# Patient Record
Sex: Male | Born: 1965 | Race: White | Hispanic: No | Marital: Single | Smoking: Former smoker
Health system: Southern US, Community
[De-identification: ages and names within clinical notes are randomized; demographics above are authoritative.]

## PROBLEM LIST (undated history)

## (undated) DIAGNOSIS — L309 Dermatitis, unspecified: Secondary | ICD-10-CM

## (undated) DIAGNOSIS — K219 Gastro-esophageal reflux disease without esophagitis: Secondary | ICD-10-CM

## (undated) DIAGNOSIS — F419 Anxiety disorder, unspecified: Secondary | ICD-10-CM

## (undated) DIAGNOSIS — J019 Acute sinusitis, unspecified: Secondary | ICD-10-CM

## (undated) DIAGNOSIS — E119 Type 2 diabetes mellitus without complications: Secondary | ICD-10-CM

## (undated) DIAGNOSIS — C801 Malignant (primary) neoplasm, unspecified: Secondary | ICD-10-CM

## (undated) DIAGNOSIS — Z86010 Personal history of colonic polyps: Secondary | ICD-10-CM

## (undated) DIAGNOSIS — D494 Neoplasm of unspecified behavior of bladder: Secondary | ICD-10-CM

## (undated) DIAGNOSIS — G4733 Obstructive sleep apnea (adult) (pediatric): Secondary | ICD-10-CM

## (undated) DIAGNOSIS — R739 Hyperglycemia, unspecified: Secondary | ICD-10-CM

## (undated) DIAGNOSIS — I10 Essential (primary) hypertension: Secondary | ICD-10-CM

## (undated) DIAGNOSIS — I4510 Unspecified right bundle-branch block: Secondary | ICD-10-CM

## (undated) DIAGNOSIS — Z973 Presence of spectacles and contact lenses: Secondary | ICD-10-CM

## (undated) DIAGNOSIS — Z860101 Personal history of adenomatous and serrated colon polyps: Secondary | ICD-10-CM

## (undated) DIAGNOSIS — J302 Other seasonal allergic rhinitis: Secondary | ICD-10-CM

## (undated) DIAGNOSIS — R7303 Prediabetes: Secondary | ICD-10-CM

## (undated) DIAGNOSIS — R351 Nocturia: Secondary | ICD-10-CM

## (undated) DIAGNOSIS — Z87442 Personal history of urinary calculi: Secondary | ICD-10-CM

## (undated) HISTORY — DX: Dermatitis, unspecified: L30.9

## (undated) HISTORY — DX: Hyperglycemia, unspecified: R73.9

## (undated) HISTORY — DX: Malignant (primary) neoplasm, unspecified: C80.1

## (undated) HISTORY — PX: WISDOM TOOTH EXTRACTION: SHX21

## (undated) HISTORY — DX: Acute sinusitis, unspecified: J01.90

---

## 2003-02-24 HISTORY — PX: APPENDECTOMY: SHX54

## 2003-08-14 ENCOUNTER — Emergency Department (HOSPITAL_COMMUNITY): Admission: EM | Admit: 2003-08-14 | Discharge: 2003-08-15 | Payer: Self-pay | Admitting: Emergency Medicine

## 2003-08-16 ENCOUNTER — Encounter (INDEPENDENT_AMBULATORY_CARE_PROVIDER_SITE_OTHER): Payer: Self-pay | Admitting: Specialist

## 2003-08-16 ENCOUNTER — Inpatient Hospital Stay (HOSPITAL_COMMUNITY): Admission: EM | Admit: 2003-08-16 | Discharge: 2003-08-21 | Payer: Self-pay | Admitting: Emergency Medicine

## 2003-08-16 HISTORY — PX: LAPAROSCOPIC APPENDECTOMY: SUR753

## 2004-10-17 ENCOUNTER — Ambulatory Visit: Payer: Self-pay | Admitting: Internal Medicine

## 2004-11-17 ENCOUNTER — Ambulatory Visit: Payer: Self-pay | Admitting: Internal Medicine

## 2004-11-17 ENCOUNTER — Encounter (INDEPENDENT_AMBULATORY_CARE_PROVIDER_SITE_OTHER): Payer: Self-pay | Admitting: Nurse Practitioner

## 2004-11-17 LAB — CONVERTED CEMR LAB
Glucose, Bld: 91 mg/dL
HDL: 29 mg/dL
LDL Cholesterol: 76 mg/dL
Total CHOL/HDL Ratio: 4.7
VLDL: 32 mg/dL

## 2006-09-04 ENCOUNTER — Emergency Department (HOSPITAL_COMMUNITY): Admission: EM | Admit: 2006-09-04 | Discharge: 2006-09-04 | Payer: Self-pay | Admitting: Emergency Medicine

## 2006-09-29 ENCOUNTER — Ambulatory Visit: Payer: Self-pay | Admitting: Internal Medicine

## 2006-09-29 DIAGNOSIS — K029 Dental caries, unspecified: Secondary | ICD-10-CM | POA: Insufficient documentation

## 2006-10-18 ENCOUNTER — Ambulatory Visit: Payer: Self-pay | Admitting: Internal Medicine

## 2006-10-18 ENCOUNTER — Encounter: Payer: Self-pay | Admitting: Nurse Practitioner

## 2006-10-18 DIAGNOSIS — F411 Generalized anxiety disorder: Secondary | ICD-10-CM | POA: Insufficient documentation

## 2006-10-18 DIAGNOSIS — F329 Major depressive disorder, single episode, unspecified: Secondary | ICD-10-CM | POA: Insufficient documentation

## 2006-10-18 LAB — CONVERTED CEMR LAB
BUN: 9 mg/dL (ref 6–23)
Basophils Absolute: 0 10*3/uL (ref 0.0–0.1)
Basophils Relative: 1 % (ref 0–1)
Chloride: 106 meq/L (ref 96–112)
Eosinophils Absolute: 0.2 10*3/uL (ref 0.0–0.7)
Glucose, Bld: 79 mg/dL (ref 70–99)
Hemoglobin: 17.7 g/dL — ABNORMAL HIGH (ref 13.0–17.0)
Lymphocytes Relative: 37 % (ref 12–46)
Lymphs Abs: 2.6 10*3/uL (ref 0.7–3.3)
MCV: 89.5 fL (ref 78.0–100.0)
Monocytes Relative: 7 % (ref 3–11)
RBC: 5.51 M/uL (ref 4.22–5.81)
RDW: 12.5 % (ref 11.5–14.0)
TSH: 1.717 microintl units/mL (ref 0.350–5.50)
Total Protein: 6.9 g/dL (ref 6.0–8.3)
WBC: 7 10*3/uL (ref 4.0–10.5)

## 2006-10-26 ENCOUNTER — Ambulatory Visit: Payer: Self-pay | Admitting: *Deleted

## 2006-11-10 ENCOUNTER — Encounter (INDEPENDENT_AMBULATORY_CARE_PROVIDER_SITE_OTHER): Payer: Self-pay | Admitting: *Deleted

## 2008-03-08 ENCOUNTER — Ambulatory Visit: Payer: Self-pay | Admitting: Internal Medicine

## 2008-03-08 DIAGNOSIS — J019 Acute sinusitis, unspecified: Secondary | ICD-10-CM

## 2008-03-08 DIAGNOSIS — F17200 Nicotine dependence, unspecified, uncomplicated: Secondary | ICD-10-CM | POA: Insufficient documentation

## 2008-03-08 HISTORY — DX: Acute sinusitis, unspecified: J01.90

## 2010-07-11 NOTE — H&P (Signed)
Casey [name], VENEY NO.:  [id number]   MEDICAL RECORD NO.:  [id number]                   PATIENT TYPE:  INP   LOCATION:  [place]                                 FACILITY:  [facility]   PHYSICIAN:  Edsel Petrin. Dalbert Batman, M.D.             DATE OF BIRTH:  1965-05-28   DATE OF ADMISSION:  08/16/2003  DATE OF DISCHARGE:                                HISTORY & PHYSICAL   CHIEF COMPLAINT:  Abdominal pain.   HISTORY OF PRESENT ILLNESS:  This is a 45 year old white male, previously in  good health, who 48 hours ago, on Tuesday, June 21 after lunch, he developed  the gradual onset of diffuse abdominal pain, nonlocalized.  Later on that  day, about four hours later, he came to [facility] emergency room.  He  rechecked a workup and was told that his CAT scan was normal except for a  few nonobstructing kidney stones.  He was sent home.  Yesterday, August 15, 2002, he took some Vicodin and had a little bit of nausea.  In the evening,  he felt better, but then late last night, the pain got worse and is now more  localized in the right lower quadrant.  He denies diarrhea, nausea,  vomiting, fevers or chills.  He saw Dr. Arlyss Queen at urgent medical care  today.  Dr. Everlene Farrier felt that he was mostly tender in the right lower quadrant  and was concerned about appendicitis.  A white blood cell count was 18,000.  I was called, and the patient was transferred to [facility] emergency  department.  I believe the patient has appendicitis and is being admitted  for appendectomy.   PAST MEDICAL HISTORY:  He denies any prior medical problems other than  anxiety.  The only surgery he has had is oral surgery.   CURRENT MEDICATIONS:  1. Paxil, dose unknown, 1 tablet daily.  2. Xanax tablets p.r.n.   DRUG ALLERGIES:  He was told that he had a PENICILLIN allergy in childhood  but does not really know what the reaction was.   SOCIAL HISTORY:  The patient is single and lives in [place].   He has no  children.  He lives alone.  He works as a Animator.  He  smokes 1-1/2 packs of cigarettes a day.  Drinks alcohol rarely.   FAMILY HISTORY:  Mother living.  Has bronchitis and recurrent Pseudomonas  pneumonia.  Father is living and has hypertension.  He is an only child.   REVIEW OF SYSTEMS:  All systems are reviewed.  They are noncontributory  except as described above.   PHYSICAL EXAMINATION:  VITAL SIGNS:  Temperature 99.4, pulse 120,  respirations 16, blood pressure 122/83.  GENERAL:  A mildly overweight young white male in mild distress.  Very  cooperative and alert.  HEENT:  Eyes:  Sclerae are clear.  Extraocular movements are intact.  Ears,  nose, mouth, throat:  Nose, lips, tongue, and oropharynx are without gross  lesions.  NECK:  Supple.  Nontender.  No adenopathy.  No mass.  No jugular venous  distention.  LUNGS:  Clear to auscultation.  No chest wall tenderness.  HEART:  Regular rate and rhythm.  No murmur.  Radial, femoral, and posterior  tibial pulses are palpable.  No peripheral edema.  BREASTS:  Not examined.  ABDOMEN:  Diminished bowel sounds.  Soft in the upper abdomen.  Very tender  with involuntary guarding and direct rebound to the right lower quadrant.  He is also tender in the left lower quadrant but less so.  There is no  palpable mass.  There is no hernia.  GENITOURINARY:  Normal penis, scrotum, and testes.  No inguinal hernia.  EXTREMITIES:  He moves all four extremities well without pain or deformity.  NEUROLOGIC:  No gross motor or sensory deficit.   ADMISSION DATA:  CT scan shows a thickened, inflamed appendix but no free  fluid or obvious evidence of rupture.   White blood cell count is 18,400, hemoglobin 16.6.  Complete metabolic panel  is basically normal.  Urinalysis is basically normal.   ASSESSMENT:  Acute appendicitis.   PLAN:  Patient will be taken to the operating room for appendectomy.  I have  discussed the  indications and details of the surgery with him.  Risks and  complications have been outlined, including but not limited to, bleeding,  infection, conversion to open laparotomy, injury to adjacent organs such as  the bladder, intestines, or ureter, wound problems such as infection or  hernia, cardiac, pulmonary, or thrombo-embolic problems.  He seemed to  understand these issues well.  At this time, all of his questions are  answered.  He is in agreement with this plan.                                               Edsel Petrin. Dalbert Batman, M.D.    HMI/MEDQ  D:  08/16/2003  T:  08/16/2003  Job:  [id number]   cc:   Richardson Landry A. Everlene Farrier, M.D.  [address]  [place]  [place] [address]  Fax: [phone]

## 2010-07-11 NOTE — Op Note (Signed)
NAMEROMAIN, ERION NO.:  [id number]   MEDICAL RECORD NO.:  [id number]                   PATIENT TYPE:  INP   LOCATION:  [place]                                 FACILITY:  [facility]   PHYSICIAN:  Edsel Petrin. Dalbert Batman, M.D.             DATE OF BIRTH:  12-18-1965   DATE OF PROCEDURE:  08/16/2003  DATE OF DISCHARGE:                                 OPERATIVE REPORT   PREOPERATIVE DIAGNOSIS:  Acute appendicitis.   POSTOPERATIVE DIAGNOSIS:  Ruptured appendicitis.   OPERATION PERFORMED:  Laparoscopic appendectomy.   SURGEON:  Dr. Fanny Skates   OPERATIVE INDICATIONS:  This is a 45 year old white man, who developed  abdominal pain 48 hours ago.  He came to the [facility] Emergency Room  later on that night, had lab work and a CT scan.  Nothing specific was  found, and he was sent home.  He was nauseated.  The pain got a little bit  better and then got worse and became more localized to the right lower  quadrant.  He saw Dr. Everlene Farrier in his office today, who noticed right lower  quadrant tenderness and a white blood cell count of 18,000.  We brought him  back to the emergency room where a CT scan showed a thickened inflamed  appendix.  He was operated upon urgently.   OPERATIVE FINDINGS:  The patient had a gangrenous, perforated appendix.  Fortunately, the perforation was walled off in the right lower quadrant, and  there was no evidence of diffuse peritonitis.  We were able to mobilize this  and satisfactorily remove it laparoscopically.  Nevertheless, the appendix  was gangrenous and was leaking liquid enteric content which required lots of  irrigation.   OPERATIVE TECHNIQUE:  Following the induction of general endotracheal  anesthesia, the patient's abdomen was prepped and draped in a sterile  fashion.  A Foley catheter had been previously inserted.  An 11 mm optical  port was placed in the left rectus sheath just above the level of the  umbilicus under  direct vision.  We were able to traverse the abdominal wall  very nicely and then connected the trocar to the insufflator at 15 mmHg.  We  then inserted the video camera and surveyed the area and found an  inflammatory process in the right lower quadrant, but there was no free  fluid, no free purulence, no evidence of diffuse peritonitis.  We ultimately  put a 12 mm trocar in the right upper quadrant and a 12 mm trocar in the  suprapubic area.  We teased the tissues away from the appendix and  identified the tip of the appendix, the length of the appendix, and the  insertion of the appendix onto the cecum.  Things were sort of thickened and  inflamed, and so I chose to lift up the proximal appendix and dissect  underneath the base of the appendix and  then stapled this off at the level  of the cecum with EndoGIA stapling device.  We then divided the mesoappendix  with electrocautery, placed the appendix in a specimen bag, and removed it.  We then spent a great deal of time irrigating the right lower quadrant and  the pelvis and inspecting the operative field.  We irrigated with 2000 mL  just to be sure, but actually things were quite clean and nice at the end of  the case, and there was no evidence of any further contamination.  We  checked the left paracolic gutter, the pelvis, the right paracolic gutter,  and the right upper quadrant and felt that all the fluid was clear and had  been removed.  The staple line on the cecum looked nice.  We checked, and it  appeared fairly flush on the cecal wall.  There was no bleeding.  The  trocars were removed under direct vision, and there was no bleeding from the  trocar sites.  The pneumoperitoneum was  released.  The skin was closed with skin staples and Steri-Strips.  Clean  bandages were placed and the patient taken to the recovery room in stable  condition.  Estimated blood loss was about 25 mL.  Complications were none.  Sponge, needle, and  instrument counts were correct.                                               Edsel Petrin. Dalbert Batman, M.D.    HMI/MEDQ  D:  08/16/2003  T:  08/16/2003  Job:  [id number]   cc:   Richardson Landry A. Everlene Farrier, M.D.  [address]  [place]  [place] [address]  Fax: [phone]

## 2010-07-11 NOTE — Discharge Summary (Signed)
NAMEAREEB, CORRON NO.:  [id number]   MEDICAL RECORD NO.:  [id number]                   PATIENT TYPE:  INP   LOCATION:  [place]                                 FACILITY:  [facility]   PHYSICIAN:  Edsel Petrin. Dalbert Batman, M.D.             DATE OF BIRTH:  09-16-1965   DATE OF ADMISSION:  08/16/2003  DATE OF DISCHARGE:  08/21/2003                                 DISCHARGE SUMMARY   FINAL DIAGNOSIS:  Acute appendicitis, ruptured.   OPERATION PERFORMED:  Laparoscopic appendectomy on August 16, 2003.   HISTORY:  This is a 45 year old white male who had been in good health.  On  Tuesday, June 21, he developed abdominal pain and came to the [facility]  emergency room.  He had a fairly extensive workup, including a CAT scan and  was told that everything was normal except for a few small kidney stones.  He was sent home.  He felt a little bit better and then got worse.  He saw  Dr. Arlyss Queen at urgent medical care on August 16, 2003.  Dr. Everlene Farrier felt that  he was very tender in the right lower quadrant and did a white count, which  was 18,000.  Referred the patient to me at [facility] emergency department.  He was admitted for management.   PHYSICAL EXAMINATION:  VITAL SIGNS:  Temp 99.4, pulse 120.  GENERAL:  Slightly overweight young man in mild distress.  Very cooperative.  ABDOMEN:  Significant physical findings were limited to the abdomen, where  he had diminished bowel sounds.  Upper abdomen was soft but very tender with  involuntary guarding in the right lower quadrant.  He was a little bit  tender in the left lower quadrant.  No palpable mass.   ADMISSION DATA:  A CT scan was obtained and showed a thickened, inflamed  appendix.   White blood cell count was 18,400.  Complete metabolic panel abnormal.  Urinalysis normal.   HOSPITAL COURSE:  The patient was taken to the operating room promptly and  underwent a laparoscopic appendectomy.  I found that he had a  ruptured  appendix that had walled off in the right lower quadrant.  He did not have  diffuse peritonitis.  The appendix was obviously gangrenous, but the surgery  went well.   Postoperatively, he did reasonably well, although he had fevers for 2-3  days.  He was maintained on broad-spectrum antibiotics because of the  ruptured nature of his appendicitis.  He had an ileus and did not want to  eat early on but then slowly progressed.  We advanced him up to a regular  diet on August 20, 2003.  He was ready to be discharged on August 21, 2003.  At  that time, he was tolerating a diet, having bowel movements.  His wounds  looked fine.  His white  count was down to 8800.  We removed his staples and applied Steri-Strips to  his wound.  We gave him a prescription for Vicodin for pain.  I gave him a  prescription for Cipro for 48 more hours.  I asked him to return to see me  in the office in two weeks.                                               Edsel Petrin. Dalbert Batman, M.D.    HMI/MEDQ  D:  08/25/2003  T:  08/25/2003  Job:  [id number]   cc:   Richardson Landry A. Everlene Farrier, M.D.  [address]  [place]  [place] [address]  Fax: [phone]

## 2015-01-14 ENCOUNTER — Ambulatory Visit (INDEPENDENT_AMBULATORY_CARE_PROVIDER_SITE_OTHER): Payer: BLUE CROSS/BLUE SHIELD | Admitting: Emergency Medicine

## 2015-01-14 VITALS — BP 138/98 | HR 93 | Temp 98.2°F | Resp 16 | Ht 73.0 in | Wt 246.0 lb

## 2015-01-14 DIAGNOSIS — R1030 Lower abdominal pain, unspecified: Secondary | ICD-10-CM | POA: Diagnosis not present

## 2015-01-14 DIAGNOSIS — F17200 Nicotine dependence, unspecified, uncomplicated: Secondary | ICD-10-CM | POA: Diagnosis not present

## 2015-01-14 DIAGNOSIS — R1031 Right lower quadrant pain: Secondary | ICD-10-CM | POA: Diagnosis not present

## 2015-01-14 DIAGNOSIS — K2990 Gastroduodenitis, unspecified, without bleeding: Secondary | ICD-10-CM | POA: Diagnosis not present

## 2015-01-14 DIAGNOSIS — K2970 Gastritis, unspecified, without bleeding: Secondary | ICD-10-CM

## 2015-01-14 DIAGNOSIS — K5732 Diverticulitis of large intestine without perforation or abscess without bleeding: Secondary | ICD-10-CM

## 2015-01-14 LAB — POCT URINALYSIS DIP (MANUAL ENTRY)
BILIRUBIN UA: NEGATIVE
BILIRUBIN UA: NEGATIVE
GLUCOSE UA: NEGATIVE
LEUKOCYTES UA: NEGATIVE
NITRITE UA: NEGATIVE
Protein Ur, POC: NEGATIVE
Spec Grav, UA: 1.02
Urobilinogen, UA: 1
pH, UA: 7

## 2015-01-14 LAB — COMPREHENSIVE METABOLIC PANEL
ALT: 34 U/L (ref 9–46)
AST: 28 U/L (ref 10–40)
Albumin: 4.2 g/dL (ref 3.6–5.1)
Alkaline Phosphatase: 78 U/L (ref 40–115)
BILIRUBIN TOTAL: 1 mg/dL (ref 0.2–1.2)
BUN: 10 mg/dL (ref 7–25)
CHLORIDE: 104 mmol/L (ref 98–110)
CO2: 26 mmol/L (ref 20–31)
CREATININE: 0.8 mg/dL (ref 0.60–1.35)
Calcium: 9.8 mg/dL (ref 8.6–10.3)
GLUCOSE: 94 mg/dL (ref 65–99)
Potassium: 4.2 mmol/L (ref 3.5–5.3)
SODIUM: 138 mmol/L (ref 135–146)
Total Protein: 7 g/dL (ref 6.1–8.1)

## 2015-01-14 LAB — POC MICROSCOPIC URINALYSIS (UMFC): MUCUS RE: ABSENT

## 2015-01-14 LAB — POCT CBC
Granulocyte percent: 75.3 %G (ref 37–80)
HEMATOCRIT: 53.2 % (ref 43.5–53.7)
Hemoglobin: 18.8 g/dL — AB (ref 14.1–18.1)
LYMPH, POC: 2.3 (ref 0.6–3.4)
MCH: 32.9 pg — AB (ref 27–31.2)
MCHC: 35.4 g/dL (ref 31.8–35.4)
MCV: 92.9 fL (ref 80–97)
MID (CBC): 0.4 (ref 0–0.9)
MPV: 6.9 fL (ref 0–99.8)
POC GRANULOCYTE: 8.1 — AB (ref 2–6.9)
POC LYMPH PERCENT: 21.1 %L (ref 10–50)
POC MID %: 3.6 % (ref 0–12)
Platelet Count, POC: 164 10*3/uL (ref 142–424)
RBC: 5.73 M/uL (ref 4.69–6.13)
RDW, POC: 12.3 %
WBC: 10.8 10*3/uL — AB (ref 4.6–10.2)

## 2015-01-14 MED ORDER — METRONIDAZOLE 500 MG PO TABS: 500.0000 mg | ORAL_TABLET | Freq: Three times a day (TID) | ORAL | Status: DC

## 2015-01-14 MED ORDER — SUCRALFATE 1 G PO TABS: ORAL_TABLET | ORAL | Status: DC

## 2015-01-14 MED ORDER — LANSOPRAZOLE 30 MG PO CPDR: 30.0000 mg | DELAYED_RELEASE_CAPSULE | Freq: Every day | ORAL | Status: DC

## 2015-01-14 MED ORDER — CIPROFLOXACIN HCL 500 MG PO TABS: 500.0000 mg | ORAL_TABLET | Freq: Two times a day (BID) | ORAL | Status: DC

## 2015-01-14 NOTE — Patient Instructions (Signed)
Diverticulosis Diverticulosis is the condition that develops when small pouches (diverticula) form in the wall of your colon. Your colon, or large intestine, is where water is absorbed and stool is formed. The pouches form when the inside layer of your colon pushes through weak spots in the outer layers of your colon. CAUSES  No one knows exactly what causes diverticulosis. RISK FACTORS  Being older than 35. Your risk for this condition increases with age. Diverticulosis is rare in people younger than 40 years. By age 53, almost everyone has it.  Eating a low-fiber diet.  Being frequently constipated.  Being overweight.  Not getting enough exercise.  Smoking.  Taking over-the-counter pain medicines, like aspirin and ibuprofen. SYMPTOMS  Most people with diverticulosis do not have symptoms. DIAGNOSIS  Because diverticulosis often has no symptoms, health care providers often discover the condition during an exam for other colon problems. In many cases, a health care provider will diagnose diverticulosis while using a flexible scope to examine the colon (colonoscopy). TREATMENT  If you have never developed an infection related to diverticulosis, you may not need treatment. If you have had an infection before, treatment may include:  Eating more fruits, vegetables, and grains.  Taking a fiber supplement.  Taking a live bacteria supplement (probiotic).  Taking medicine to relax your colon. HOME CARE INSTRUCTIONS   Drink at least 6-8 glasses of water each day to prevent constipation.  Try not to strain when you have a bowel movement.  Keep all follow-up appointments. If you have had an infection before:  Increase the fiber in your diet as directed by your health care provider or dietitian.  Take a dietary fiber supplement if your health care provider approves.  Only take medicines as directed by your health care provider. SEEK MEDICAL CARE IF:   You have abdominal  pain.  You have bloating.  You have cramps.  You have not gone to the bathroom in 3 days. SEEK IMMEDIATE MEDICAL CARE IF:   Your pain gets worse.  Yourbloating becomes very bad.  You have a fever or chills, and your symptoms suddenly get worse.  You begin vomiting.  You have bowel movements that are bloody or black. MAKE SURE YOU:  Understand these instructions.  Will watch your condition.  Will get help right away if you are not doing well or get worse.   This information is not intended to replace advice given to you by your health care provider. Make sure you discuss any questions you have with your health care provider.   Document Released: 11/07/2003 Document Revised: 02/14/2013 Document Reviewed: 01/04/2013 Elsevier Interactive Patient Education [employer].

## 2015-01-14 NOTE — Progress Notes (Signed)
Subjective:  Patient ID: Casey [name], male    DOB: 1966/01/11  Age: 49 y.o. MRN: [id number]  CC: Abdominal Pain; Back Pain; and Medication Refill   HPI Casey [name] presents  patient has low lower abdominal pain. Pain started yesterday. He has no blood mucus or pus in stool. No stool changes. No nausea vomiting or fever or chills. He's never had a colonoscopy. Had his appendix removed. He dysuria urgency or frequency. He had poor appetite for the last several days. He is a smoker. He has a specific food intolerance but he's had less appetite and food consumption than he usually has.  History Casey [name] has no past medical history on file.   He has no past surgical history on file.   His  family history is not on file.  He   reports that he has been smoking.  He does not have any smokeless tobacco history on file. His alcohol and drug histories are not on file.  No outpatient prescriptions prior to visit.   No facility-administered medications prior to visit.    Social History   Social History  . Marital Status: Single    Spouse Name: N/A  . Number of Children: N/A  . Years of Education: N/A   Social History Main Topics  . Smoking status: Current Every Day Smoker  . Smokeless tobacco: None  . Alcohol Use: None  . Drug Use: None  . Sexual Activity: Not Asked   Other Topics Concern  . None   Social History Narrative  . None     Review of Systems  Constitutional: Negative for fever, chills and appetite change.  HENT: Negative for congestion, ear pain, postnasal drip, sinus pressure and sore throat.   Eyes: Negative for pain and redness.  Respiratory: Negative for cough, shortness of breath and wheezing.   Cardiovascular: Negative for leg swelling.  Gastrointestinal: Positive for nausea, abdominal pain and abdominal distention. Negative for vomiting, diarrhea, constipation and blood in stool.  Endocrine: Negative for polyuria.  Genitourinary: Negative for dysuria,  urgency, frequency and flank pain.  Musculoskeletal: Negative for gait problem.  Skin: Negative for rash.  Neurological: Negative for weakness and headaches.  Psychiatric/Behavioral: Negative for confusion and decreased concentration. The patient is not nervous/anxious.     Objective:  BP 138/98 mmHg  Pulse 93  Temp(Src) 98.2 F (36.8 C) (Oral)  Resp 16  Ht 6' 1"  (1.854 m)  Wt 246 lb (111.585 kg)  BMI 32.46 kg/m2  SpO2 98%  Physical Exam  Constitutional: He is oriented to person, place, and time. He appears well-developed and well-nourished. No distress.  HENT:  Head: Normocephalic and atraumatic.  Right Ear: External ear normal.  Left Ear: External ear normal.  Nose: Nose normal.  Eyes: Conjunctivae and EOM are normal. Pupils are equal, round, and reactive to light. No scleral icterus.  Neck: Normal range of motion. Neck supple. No tracheal deviation present.  Cardiovascular: Normal rate, regular rhythm and normal heart sounds.   Pulmonary/Chest: Effort normal. No respiratory distress. He has no wheezes. He has no rales.  Abdominal: He exhibits no mass. There is tenderness in the right lower quadrant and left lower quadrant. There is no rebound and no guarding.  Musculoskeletal: He exhibits no edema.  Lymphadenopathy:    He has no cervical adenopathy.  Neurological: He is alert and oriented to person, place, and time. Coordination normal.  Skin: Skin is warm and dry. No rash noted.  Psychiatric: He has a normal mood and  affect. His behavior is normal.      Assessment & Plan:   Casey [name] was seen today for abdominal pain, back pain and medication refill.  Diagnoses and all orders for this visit:  Lower abdominal pain -     POCT CBC -     POCT urinalysis dipstick -     POCT Microscopic Urinalysis ([facility]) -     Comprehensive metabolic panel  Diverticulitis of colon  TOBACCO ABUSE  Gastritis and gastroduodenitis  Other orders -     lansoprazole (PREVACID) 30 MG  capsule; Take 1 capsule (30 mg total) by mouth daily at 12 noon. -     sucralfate (CARAFATE) 1 G tablet; 1 tablet 1 hr ac and hs -     ciprofloxacin (CIPRO) 500 MG tablet; Take 1 tablet (500 mg total) by mouth 2 (two) times daily. -     metroNIDAZOLE (FLAGYL) 500 MG tablet; Take 1 tablet (500 mg total) by mouth 3 (three) times daily. DO NOT CONSUME ALCOHOL WHILE TAKING THIS MEDICATION.  I am having Casey [name] start on lansoprazole, sucralfate, ciprofloxacin, and metroNIDAZOLE.  Meds ordered this encounter  Medications  . lansoprazole (PREVACID) 30 MG capsule    Sig: Take 1 capsule (30 mg total) by mouth daily at 12 noon.    Dispense:  30 capsule    Refill:  5  . sucralfate (CARAFATE) 1 G tablet    Sig: 1 tablet 1 hr ac and hs    Dispense:  120 tablet    Refill:  0  . ciprofloxacin (CIPRO) 500 MG tablet    Sig: Take 1 tablet (500 mg total) by mouth 2 (two) times daily.    Dispense:  20 tablet    Refill:  0  . metroNIDAZOLE (FLAGYL) 500 MG tablet    Sig: Take 1 tablet (500 mg total) by mouth 3 (three) times daily. DO NOT CONSUME ALCOHOL WHILE TAKING THIS MEDICATION.    Dispense:  30 tablet    Refill:  0   Is not clear that his abdominal pain is related to diverticulitis but he certainly has tenderness and minimal elevation of white count pseudoephedrine (going treat him for follow-up if there is no improvement Appropriate red flag conditions were discussed with the patient as well as actions that should be taken.  Patient expressed his understanding.  Follow-up: Return if symptoms worsen or fail to improve.  Roselee Culver, MD   Results for orders placed or performed in visit on 01/14/15  POCT CBC  Result Value Ref Range   WBC 10.8 (A) 4.6 - 10.2 K/uL   Lymph, poc 2.3 0.6 - 3.4   POC LYMPH PERCENT 21.1 10 - 50 %L   MID (cbc) 0.4 0 - 0.9   POC MID % 3.6 0 - 12 %M   POC Granulocyte 8.1 (A) 2 - 6.9   Granulocyte percent 75.3 37 - 80 %G   RBC 5.73 4.69 - 6.13 M/uL    Hemoglobin 18.8 (A) 14.1 - 18.1 g/dL   HCT, POC 53.2 43.5 - 53.7 %   MCV 92.9 80 - 97 fL   MCH, POC 32.9 (A) 27 - 31.2 pg   MCHC 35.4 31.8 - 35.4 g/dL   RDW, POC 12.3 %   Platelet Count, POC 164 142 - 424 K/uL   MPV 6.9 0 - 99.8 fL  POCT urinalysis dipstick  Result Value Ref Range   Color, UA yellow yellow   Clarity, UA clear clear   Glucose,  UA negative negative   Bilirubin, UA negative negative   Ketones, POC UA negative negative   Spec Grav, UA 1.020    Blood, UA trace-intact (A) negative   pH, UA 7.0    Protein Ur, POC negative negative   Urobilinogen, UA 1.0    Nitrite, UA Negative Negative   Leukocytes, UA Negative Negative  POCT Microscopic Urinalysis ([facility])  Result Value Ref Range   WBC,UR,HPF,POC None None WBC/hpf   RBC,UR,HPF,POC None None RBC/hpf   Bacteria None None, Too numerous to count   Mucus Absent Absent   Epithelial Cells, UR Per Microscopy None None, Too numerous to count cells/hpf

## 2015-01-29 ENCOUNTER — Ambulatory Visit (INDEPENDENT_AMBULATORY_CARE_PROVIDER_SITE_OTHER): Payer: BLUE CROSS/BLUE SHIELD | Admitting: Physician Assistant

## 2015-01-29 VITALS — BP 122/84 | HR 84 | Temp 98.6°F | Resp 16 | Ht 73.0 in | Wt 244.6 lb

## 2015-01-29 DIAGNOSIS — M542 Cervicalgia: Secondary | ICD-10-CM

## 2015-01-29 DIAGNOSIS — E669 Obesity, unspecified: Secondary | ICD-10-CM | POA: Diagnosis not present

## 2015-01-29 DIAGNOSIS — M5410 Radiculopathy, site unspecified: Secondary | ICD-10-CM

## 2015-01-29 DIAGNOSIS — M792 Neuralgia and neuritis, unspecified: Secondary | ICD-10-CM | POA: Diagnosis not present

## 2015-01-29 LAB — GLUCOSE, POCT (MANUAL RESULT ENTRY): POC Glucose: 89 mg/dl (ref 70–99)

## 2015-01-29 LAB — HEMOGLOBIN A1C: Hgb A1c MFr Bld: 5.3 % (ref 4.0–6.0)

## 2015-01-29 LAB — POCT GLYCOSYLATED HEMOGLOBIN (HGB A1C): HEMOGLOBIN A1C: 5.3

## 2015-01-29 MED ORDER — PREDNISONE 20 MG PO TABS: ORAL_TABLET | ORAL | Status: AC

## 2015-01-29 MED ORDER — CYCLOBENZAPRINE HCL 10 MG PO TABS: 10.0000 mg | ORAL_TABLET | Freq: Three times a day (TID) | ORAL | Status: DC | PRN

## 2015-01-29 NOTE — Progress Notes (Signed)
01/29/2015 1:57 PM   DOB: 1965-07-23 / MRN: [id number]  SUBJECTIVE:  Casey [name] is a 50 y.o. male presenting for left sided neck pain that started two days ago upon awakening.  Reports the pain is worsening and now is starting to have some tingling going to his left index and middle finger along with some left sided shoulder pain.  Denies any weakness and change in coordination.  Denies neck trauma and has never had this problem before.    He is allergic to penicillins.   He  has no past medical history on file.    He  reports that he has been smoking.  He does not have any smokeless tobacco history on file. He  has no sexual activity history on file. The patient  has no past surgical history on file.  His family history includes Hypertension in his father; Stroke in his mother.  Review of Systems  Constitutional: Negative for fever and chills.  Eyes: Negative for blurred vision.  Respiratory: Negative for cough and shortness of breath.   Cardiovascular: Negative for chest pain.  Gastrointestinal: Negative for nausea and abdominal pain.  Genitourinary: Negative for dysuria, urgency and frequency.  Musculoskeletal: Positive for myalgias and neck pain. Negative for back pain, joint pain and falls.  Skin: Negative for rash.  Neurological: Negative for dizziness, tingling and headaches.  Psychiatric/Behavioral: Negative for depression. The patient is not nervous/anxious.     Problem list and medications reviewed and updated by myself where necessary, and exist elsewhere in the encounter.   OBJECTIVE:  BP 122/84 mmHg  Pulse 84  Temp(Src) 98.6 F (37 C) (Oral)  Resp 16  Ht 6' 1"  (1.854 m)  Wt 244 lb 9.6 oz (110.95 kg)  BMI 32.28 kg/m2  SpO2 98% Estimated Creatinine Clearance: 145.8 mL/min (by C-G formula based on Cr of 0.8).  Physical Exam  Constitutional: He is oriented to person, place, and time. He appears well-developed. He does not appear ill.  Eyes: Conjunctivae and EOM  are normal. Pupils are equal, round, and reactive to light.  Cardiovascular: Normal rate.   Pulmonary/Chest: Effort normal.  Abdominal: He exhibits no distension.  Musculoskeletal: Normal range of motion.  Neurological: He is alert and oriented to person, place, and time. He has normal strength. No cranial nerve deficit or sensory deficit. Coordination normal.  Reflex Scores:      Tricep reflexes are 2+ on the right side and 2+ on the left side.      Bicep reflexes are 2+ on the right side and 2+ on the left side.      Brachioradialis reflexes are 2+ on the right side and 2+ on the left side. Skin: Skin is warm and dry. He is not diaphoretic.  Psychiatric: He has a normal mood and affect.  Nursing note and vitals reviewed.   Results for orders placed or performed in visit on 01/29/15 (from the past 48 hour(s))  POCT glycosylated hemoglobin (Hb A1C)     Status: None   Collection Time: 01/29/15  1:47 PM  Result Value Ref Range   Hemoglobin A1C 5.3   POCT glucose (manual entry)     Status: None   Collection Time: 01/29/15  1:47 PM  Result Value Ref Range   POC Glucose 89 70 - 99 mg/dl    ASSESSMENT AND PLAN  Casey [name] was seen today for neck pain.  Diagnoses and all orders for this visit:  Neck pain: Patient with new onset neck pain and paresthesia  along the C6/C5 dermatome.  No neck trauma.  A neck radiograph would most likely be unhelpful.  Will treat to reduce pain and inflammation.  RTC in 7 days if not improved.   -     cyclobenzaprine (FLEXERIL) 10 MG tablet; Take 1 tablet (10 mg total) by mouth 3 (three) times daily as needed for muscle spasms.  Radicular pain -     predniSONE (DELTASONE) 20 MG tablet; Take 3 in the morning for three days, then 2 in the morning for three days, and one in the morning for 3 days.  Obesity: Negative for diabetes.  -     POCT glycosylated hemoglobin (Hb A1C) -     POCT glucose (manual entry)    The patient was advised to call or return to clinic  if he does not see an improvement in symptoms or to seek the care of the closest emergency department if he worsens with the above plan.   Philis Fendt, MHS, PA-C [facility] 01/29/2015 1:57 PM

## 2015-01-30 ENCOUNTER — Encounter (INDEPENDENT_AMBULATORY_CARE_PROVIDER_SITE_OTHER): Payer: BLUE CROSS/BLUE SHIELD | Admitting: Ophthalmology

## 2015-01-30 DIAGNOSIS — H35033 Hypertensive retinopathy, bilateral: Secondary | ICD-10-CM

## 2015-01-30 DIAGNOSIS — H2513 Age-related nuclear cataract, bilateral: Secondary | ICD-10-CM | POA: Diagnosis not present

## 2015-01-30 DIAGNOSIS — I10 Essential (primary) hypertension: Secondary | ICD-10-CM | POA: Diagnosis not present

## 2015-01-30 DIAGNOSIS — H43813 Vitreous degeneration, bilateral: Secondary | ICD-10-CM | POA: Diagnosis not present

## 2015-02-07 ENCOUNTER — Encounter: Payer: Self-pay | Admitting: Family Medicine

## 2015-04-06 ENCOUNTER — Ambulatory Visit (INDEPENDENT_AMBULATORY_CARE_PROVIDER_SITE_OTHER): Payer: BLUE CROSS/BLUE SHIELD | Admitting: Family Medicine

## 2015-04-06 VITALS — BP 132/90 | HR 80 | Temp 99.1°F | Resp 16 | Ht 73.0 in | Wt 250.0 lb

## 2015-04-06 DIAGNOSIS — L309 Dermatitis, unspecified: Secondary | ICD-10-CM

## 2015-04-06 DIAGNOSIS — F17200 Nicotine dependence, unspecified, uncomplicated: Secondary | ICD-10-CM | POA: Diagnosis not present

## 2015-04-06 MED ORDER — TRIAMCINOLONE ACETONIDE 0.1 % EX CREA: 1.0000 "application " | TOPICAL_CREAM | Freq: Two times a day (BID) | CUTANEOUS | Status: DC

## 2015-04-06 MED ORDER — VARENICLINE TARTRATE 0.5 MG X 11 & 1 MG X 42 PO MISC: ORAL | Status: DC

## 2015-04-06 MED ORDER — VARENICLINE TARTRATE 1 MG PO TABS
1.0000 mg | ORAL_TABLET | Freq: Two times a day (BID) | ORAL | Status: DC
Start: 2015-04-06 — End: 2015-10-04

## 2015-04-06 NOTE — Progress Notes (Signed)
[facility] [address] [place] [address] [phone]  Date:  04/06/2015   Name:  Casey [name]   DOB:  Apr 18, 1965   MRN:  [id number]  PCP:  Mack Hook, MD    Chief Complaint: Nicotine Dependence and Skin itching   History of Present Illness:  Casey [name] is a 50 y.o. very pleasant male patient who presents with the following:  Here today seeking consultation for quitting smoking.  He has been a smoker since he was about 50 yo.  Currently he smokes 2 PPD.  This amount does vary but he rarely smokes less than 1.5 ppd.  So far he has not tried to quit really in the last 10 years.  He has not decided that he needs to quit- he is motivated to do so.  He is interested in using chantix.  Some friends have used this with success  He also has noted dry,itchy skin for about 10 years- he received a course of oral steroids for a pinched nerve last month and his skin cleared right up!  He tends to scratch during his sleep and has some skin excoriations    Patient Active Problem List   Diagnosis Date Noted  . TOBACCO ABUSE 03/08/2008  . SINUSITIS, ACUTE 03/08/2008  . ANXIETY 10/18/2006  . DEPRESSION 10/18/2006  . DENTAL CARIES 09/29/2006    No past medical history on file.  No past surgical history on file.  Social History  Substance Use Topics  . Smoking status: Current Every Day Smoker  . Smokeless tobacco: None  . Alcohol Use: None    Family History  Problem Relation Age of Onset  . Stroke Mother   . Hypertension Father     Allergies  Allergen Reactions  . Penicillins     REACTION: had reaction as an infant--cannot recall reaction    Medication list has been reviewed and updated.  Current Outpatient Prescriptions on File Prior to Visit  Medication Sig Dispense Refill  . lansoprazole (PREVACID) 30 MG capsule Take 1 capsule (30 mg total) by mouth daily at 12 noon. 30 capsule 5  . sucralfate (CARAFATE) 1 G tablet 1 tablet 1 hr ac and  hs (Patient not taking: Reported on 04/06/2015) 120 tablet 0   No current facility-administered medications on file prior to visit.    Review of Systems:  As per HPI- otherwise negative.   Physical Examination: Filed Vitals:   04/06/15 0825  BP: 132/90  Pulse: 80  Temp: 99.1 F (37.3 C)  Resp: 16   Filed Vitals:   04/06/15 0825  Height: 6' 1"  (1.854 m)  Weight: 250 lb (113.399 kg)   Body mass index is 32.99 kg/(m^2). Ideal Body Weight: Weight in (lb) to have BMI = 25: 189.1  GEN: WDWN, NAD, Non-toxic, A & O x 3, obese, looks well HEENT: Atraumatic, Normocephalic. Neck supple. No masses, No LAD. Ears and Nose: No external deformity. CV: RRR, No M/G/R. No JVD. No thrill. No extra heart sounds. PULM: CTA B, no wheezes, crackles, rhonchi. No retractions. No resp. distress. No accessory muscle use. EXTR: No c/c/e NEURO Normal gait.  PSYCH: Normally interactive. Conversant. Not depressed or anxious appearing.  Calm demeanor.  Dry excoriated skin along his shins and inner elbows.    Assessment and Plan: Tobacco use disorder - Plan: varenicline (CHANTIX STARTING MONTH PAK) 0.5 MG X 11 & 1 MG X 42 tablet, varenicline (CHANTIX CONTINUING MONTH PAK) 1 MG tablet  Eczema - Plan: triamcinolone  cream (KENALOG) 0.1 %  Discussed tobacco cessation in details and gave rx for chantix.   Discussed eczema- avoidance of hot water, emollients, and triamcinolone as needed   Signed Lamar Blinks, MD

## 2015-04-06 NOTE — Patient Instructions (Signed)
Best of luck with quitting smoking. Pick your quit date, then start the chantix starter pack a week prior to your desired quit date.   After you starter pack use the regular (continuing month) packs as needed- generally patients use the medication for 3-4 months.  Let me know if you have any problems   Call [facility] is available 24/7 toll-free at  1-800-QUIT-NOW [phone]).  Try the triamcinolone cream as needed for your eczema.  You can mix this with a gentle moisturizer as needed Avoid using the triamcinolone on your face

## 2015-08-24 ENCOUNTER — Other Ambulatory Visit: Payer: Self-pay

## 2015-08-24 MED ORDER — LANSOPRAZOLE 30 MG PO CPDR: 30.0000 mg | DELAYED_RELEASE_CAPSULE | Freq: Every day | ORAL | Status: DC

## 2015-09-07 ENCOUNTER — Encounter: Payer: Self-pay | Admitting: Family Medicine

## 2015-09-07 ENCOUNTER — Ambulatory Visit (INDEPENDENT_AMBULATORY_CARE_PROVIDER_SITE_OTHER): Payer: BLUE CROSS/BLUE SHIELD | Admitting: Family Medicine

## 2015-09-07 VITALS — BP 138/82 | HR 81 | Temp 98.3°F | Resp 12 | Ht 73.0 in | Wt 255.0 lb

## 2015-09-07 DIAGNOSIS — I10 Essential (primary) hypertension: Secondary | ICD-10-CM | POA: Diagnosis not present

## 2015-09-07 DIAGNOSIS — R739 Hyperglycemia, unspecified: Secondary | ICD-10-CM | POA: Insufficient documentation

## 2015-09-07 HISTORY — DX: Hyperglycemia, unspecified: R73.9

## 2015-09-07 LAB — BASIC METABOLIC PANEL
BUN: 8 mg/dL (ref 7–25)
CALCIUM: 9.3 mg/dL (ref 8.6–10.3)
CO2: 24 mmol/L (ref 20–31)
Chloride: 107 mmol/L (ref 98–110)
Creat: 0.93 mg/dL (ref 0.70–1.33)
Glucose, Bld: 178 mg/dL — ABNORMAL HIGH (ref 65–99)
Potassium: 4.2 mmol/L (ref 3.5–5.3)
SODIUM: 139 mmol/L (ref 135–146)

## 2015-09-07 MED ORDER — LISINOPRIL 10 MG PO TABS: 10.0000 mg | ORAL_TABLET | Freq: Every day | ORAL | Status: DC

## 2015-09-07 NOTE — Progress Notes (Signed)
    Rual Vermeer is a 50 y.o. male who presents to [facility] today for hypertension. Patient is a past history of hypertension. He was lost to follow-up and stopped taking the lisinopril that was working well. He notes he is checked his blood pressure several times at home and found to be in the 160s over 90s range. He denies any chest pains palpitations or shortness of breath. He feels well otherwise. He wishes his primary care provider to be at this clinic.   No past medical history on file. No past surgical history on file. Social History  Substance Use Topics  . Smoking status: Current Every Day Smoker  . Smokeless tobacco: Not on file  . Alcohol Use: Not on file   ROS as above Medications: Current Outpatient Prescriptions  Medication Sig Dispense Refill  . lansoprazole (PREVACID) 30 MG capsule Take 1 capsule (30 mg total) by mouth daily at 12 noon. 30 capsule 5  . triamcinolone cream (KENALOG) 0.1 % Apply 1 application topically 2 (two) times daily. Use as needed for eczema 464 g 1  . varenicline (CHANTIX CONTINUING MONTH PAK) 1 MG tablet Take 1 tablet (1 mg total) by mouth 2 (two) times daily. 60 tablet 4  . varenicline (CHANTIX STARTING MONTH PAK) 0.5 MG X 11 & 1 MG X 42 tablet Take one 0.5 mg tablet by mouth once daily for 3 days, then increase to one 0.5 mg tablet twice daily for 4 days, then increase to one 1 mg tablet twice daily. 53 tablet 0  . lisinopril (PRINIVIL,ZESTRIL) 10 MG tablet Take 1 tablet (10 mg total) by mouth daily. 30 tablet 0   No current facility-administered medications for this visit.   Allergies  Allergen Reactions  . Penicillins Rash    REACTION: had reaction as an infant--cannot recall reaction     Exam:  BP 138/82 mmHg  Pulse 81  Temp(Src) 98.3 F (36.8 C) (Oral)  Resp 12  Ht 6' 1"  (1.854 m)  Wt 255 lb (115.667 kg)  BMI 33.65 kg/m2  SpO2 99% Gen: Well NAD HEENT: EOMI,  MMM Lungs: Normal work of breathing. CTABL Heart: RRR no MRG Abd:  NABS, Soft. Nondistended, Nontender Exts: Brisk capillary refill, warm and well perfused.   No results found for this or any previous visit (from the past 24 hour(s)). No results found.  Assessment and Plan: 50 y.o. male with Hypertension. Patient continues to have elevated borderline blood pressures and significantly elevated blood pressures at home. Plan to start low-dose lisinopril check basic metabolic panel today. Patient will return to clinic in 1 month fasting for repeat blood pressure check, repeat creatinine and potassium checked, as well as fasting labs.  I recommended that he return for a wellness visit as well as the near future. We discussed colon cancer screening as well as the importance of lipid management for a man in his 38s with hypertension who smokes.  Discussed warning signs or symptoms. Please see discharge instructions. Patient expresses understanding.

## 2015-09-07 NOTE — Patient Instructions (Addendum)
Thank you for coming in today. Return fasting in about a month to recheck blood pressure, kidney function, and cholesterol.  We should do colon cancer screening soon.   Lisinopril tablets What is this medicine? LISINOPRIL (lyse IN oh pril) is an ACE inhibitor. This medicine is used to treat high blood pressure and heart failure. It is also used to protect the heart immediately after a heart attack. This medicine may be used for other purposes; ask your health care provider or pharmacist if you have questions. What should I tell my health care provider before I take this medicine? They need to know if you have any of these conditions: -diabetes -heart or blood vessel disease -kidney disease -low blood pressure -previous swelling of the tongue, face, or lips with difficulty breathing, difficulty swallowing, hoarseness, or tightening of the throat -an unusual or allergic reaction to lisinopril, other ACE inhibitors, insect venom, foods, dyes, or preservatives -pregnant or trying to get pregnant -breast-feeding How should I use this medicine? Take this medicine by mouth with a glass of water. Follow the directions on your prescription label. You may take this medicine with or without food. If it upsets your stomach, take it with food. Take your medicine at regular intervals. Do not take it more often than directed. Do not stop taking except on your doctor's advice. Talk to your pediatrician regarding the use of this medicine in children. Special care may be needed. While this drug may be prescribed for children as young as 70 years of age for selected conditions, precautions do apply. Overdosage: If you think you have taken too much of this medicine contact a poison control center or emergency room at once. NOTE: This medicine is only for you. Do not share this medicine with others. What if I miss a dose? If you miss a dose, take it as soon as you can. If it is almost time for your next dose, take  only that dose. Do not take double or extra doses. What may interact with this medicine? Do not take this medicine with any of the following medications: -hymenoptera venomThis medicines may also interact with the following medications: -aliskiren -angiotensin receptor blockers, like losartan or valsartan -certain medicines for diabetes -diuretics -everolimus -gold compounds -lithium -NSAIDs, medicines for pain and inflammation, like ibuprofen or naproxen -potassium salts or supplements -salt substitutes -sirolimus -temsirolimus This list may not describe all possible interactions. Give your health care provider a list of all the medicines, herbs, non-prescription drugs, or dietary supplements you use. Also tell them if you smoke, drink alcohol, or use illegal drugs. Some items may interact with your medicine. What should I watch for while using this medicine? Visit your doctor or health care professional for regular check ups. Check your blood pressure as directed. Ask your doctor what your blood pressure should be, and when you should contact him or her. Do not treat yourself for coughs, colds, or pain while you are using this medicine without asking your doctor or health care professional for advice. Some ingredients may increase your blood pressure. Women should inform their doctor if they wish to become pregnant or think they might be pregnant. There is a potential for serious side effects to an unborn child. Talk to your health care professional or pharmacist for more information. Check with your doctor or health care professional if you get an attack of severe diarrhea, nausea and vomiting, or if you sweat a lot. The loss of too much body fluid can make  it dangerous for you to take this medicine. You may get drowsy or dizzy. Do not drive, use machinery, or do anything that needs mental alertness until you know how this drug affects you. Do not stand or sit up quickly, especially if you  are an older patient. This reduces the risk of dizzy or fainting spells. Alcohol can make you more drowsy and dizzy. Avoid alcoholic drinks. Avoid salt substitutes unless you are told otherwise by your doctor or health care professional. What side effects may I notice from receiving this medicine? Side effects that you should report to your doctor or health care professional as soon as possible: -allergic reactions like skin rash, itching or hives, swelling of the hands, feet, face, lips, throat, or tongue -breathing problems -signs and symptoms of kidney injury like trouble passing urine or change in the amount of urine -signs and symptoms of increased potassium like muscle weakness; chest pain; or fast, irregular heartbeat -signs and symptoms of liver injury like dark yellow or brown urine; general ill feeling or flu-like symptoms; light-colored stools; loss of appetite; nausea; right upper belly pain; unusually weak or tired; yellowing of the eyes or skin -signs and symptoms of low blood pressure like dizziness; feeling faint or lightheaded, falls; unusually weak or tired -stomach pain with or without nausea and vomiting Side effects that usually do not require medical attention (report to your doctor or health care professional if they continue or are bothersome): -changes in taste -cough -dizziness -fever -headache -sensitivity to light This list may not describe all possible side effects. Call your doctor for medical advice about side effects. You may report side effects to FDA at 1-800-FDA-1088. Where should I keep my medicine? Keep out of the reach of children. Store at room temperature between 15 and 30 degrees C (59 and 86 degrees F). Protect from moisture. Keep container tightly closed. Throw away any unused medicine after the expiration date. NOTE: This sheet is a summary. It may not cover all possible information. If you have questions about this medicine, talk to your doctor,  pharmacist, or health care provider.    2016, Elsevier/Gold Standard. (2014-10-04 20:38:20)    IF you received an x-ray today, you will receive an invoice from [facility]. Please contact [facility] at [phone] with questions or concerns regarding your invoice.   IF you received labwork today, you will receive an invoice from [employer]. Please contact [facility] at [phone] with questions or concerns regarding your invoice.   Our billing staff will not be able to assist you with questions regarding bills from these companies.  You will be contacted with the lab results as soon as they are available. The fastest way to get your results is to activate your My Chart account. Instructions are located on the last page of this paperwork. If you have not heard from [place] regarding the results in 2 weeks, please contact this office.

## 2015-09-17 ENCOUNTER — Telehealth: Payer: Self-pay | Admitting: Emergency Medicine

## 2015-09-17 NOTE — Telephone Encounter (Signed)
-----   Message from Gregor Hams, MD sent at 09/17/2015 12:45 PM EDT ----- Mr Casey [name] need to return to see a provider ideally someone who will be his PCP (not me) in 1 month for recheck. We will repeat labs and recheck blood pressure at that time.

## 2015-09-19 ENCOUNTER — Telehealth: Payer: Self-pay

## 2015-09-19 NOTE — Telephone Encounter (Signed)
Notes Recorded by Gregor Hams, MD on 09/07/2015 at 4:01 PM Your labs showed normal kidney function but the blood sugar was too high. Return as directed. We will add on diabetes testing. Return sooner if worsening.

## 2015-09-19 NOTE — Telephone Encounter (Signed)
PATIENT IS RETURNING Casey [name]'S CALL FROM Wednesday. HE STATES HE HAS LEFT SEVERAL MESSAGES WITH THE LAB. BEST PHONE [phone] (CELL)  PHARMACY CHOICE IS [facility] ON [address]. [place]

## 2015-09-20 ENCOUNTER — Telehealth: Payer: Self-pay | Admitting: Emergency Medicine

## 2015-09-20 NOTE — Telephone Encounter (Signed)
Pt called in requesting lab results and follow up regarding elevated blood sugars.  Provided instructions to return in 1 month for fasting blood work per last visit with Philis Fendt, PA. Pt will schedule appointment for f/u

## 2015-09-20 NOTE — Telephone Encounter (Signed)
Pt received lab results.

## 2015-10-03 ENCOUNTER — Other Ambulatory Visit: Payer: Self-pay | Admitting: Emergency Medicine

## 2015-10-03 ENCOUNTER — Other Ambulatory Visit: Payer: Self-pay | Admitting: Family Medicine

## 2015-10-03 DIAGNOSIS — F17200 Nicotine dependence, unspecified, uncomplicated: Secondary | ICD-10-CM

## 2015-10-04 ENCOUNTER — Other Ambulatory Visit: Payer: Self-pay | Admitting: Family Medicine

## 2015-10-04 ENCOUNTER — Ambulatory Visit (INDEPENDENT_AMBULATORY_CARE_PROVIDER_SITE_OTHER): Payer: BLUE CROSS/BLUE SHIELD | Admitting: Emergency Medicine

## 2015-10-04 VITALS — BP 120/80 | HR 91 | Temp 98.2°F | Resp 16 | Ht 74.0 in | Wt 256.0 lb

## 2015-10-04 DIAGNOSIS — I10 Essential (primary) hypertension: Secondary | ICD-10-CM

## 2015-10-04 DIAGNOSIS — Z13220 Encounter for screening for lipoid disorders: Secondary | ICD-10-CM | POA: Diagnosis not present

## 2015-10-04 DIAGNOSIS — R739 Hyperglycemia, unspecified: Secondary | ICD-10-CM | POA: Diagnosis not present

## 2015-10-04 DIAGNOSIS — F17200 Nicotine dependence, unspecified, uncomplicated: Secondary | ICD-10-CM | POA: Diagnosis not present

## 2015-10-04 DIAGNOSIS — L309 Dermatitis, unspecified: Secondary | ICD-10-CM

## 2015-10-04 LAB — BASIC METABOLIC PANEL WITH GFR
BUN: 9 mg/dL (ref 7–25)
CHLORIDE: 104 mmol/L (ref 98–110)
CO2: 26 mmol/L (ref 20–31)
CREATININE: 0.91 mg/dL (ref 0.70–1.33)
Calcium: 9.6 mg/dL (ref 8.6–10.3)
GFR, Est African American: >89 mL/min (ref >=60)
Glucose, Bld: 124 mg/dL — ABNORMAL HIGH (ref 65–99)
POTASSIUM: 4.1 mmol/L (ref 3.5–5.3)
SODIUM: 139 mmol/L (ref 135–146)

## 2015-10-04 LAB — LIPID PANEL
CHOL/HDL RATIO: 5.3 ratio — AB (ref <=5.0)
CHOLESTEROL: 133 mg/dL (ref 125–200)
HDL: 25 mg/dL — ABNORMAL LOW (ref >=40)
LDL Cholesterol: 78 mg/dL (ref <130)
Triglycerides: 151 mg/dL — ABNORMAL HIGH (ref <150)
VLDL: 30 mg/dL (ref <30)

## 2015-10-04 LAB — POCT GLYCOSYLATED HEMOGLOBIN (HGB A1C): HEMOGLOBIN A1C: 5.5

## 2015-10-04 LAB — GLUCOSE, POCT (MANUAL RESULT ENTRY): POC GLUCOSE: 124 mg/dL — AB (ref 70–99)

## 2015-10-04 LAB — POCT SKIN KOH: Skin KOH, POC: NEGATIVE

## 2015-10-04 MED ORDER — LISINOPRIL 10 MG PO TABS: 10.0000 mg | ORAL_TABLET | Freq: Every day | ORAL | 11 refills | Status: DC

## 2015-10-04 MED ORDER — TRIAMCINOLONE 0.1 % CREAM:EUCERIN CREAM 1:1: 1.0000 "application " | TOPICAL_CREAM | Freq: Two times a day (BID) | CUTANEOUS | 2 refills | Status: DC

## 2015-10-04 MED ORDER — VARENICLINE TARTRATE 1 MG PO TABS: ORAL_TABLET | ORAL | 0 refills | Status: DC

## 2015-10-04 NOTE — Patient Instructions (Addendum)
IF you received an x-ray today, you will receive an invoice from [facility]. Please contact [facility] at [phone] with questions or concerns regarding your invoice.   IF you received labwork today, you will receive an invoice from [employer]. Please contact [facility] at [phone] with questions or concerns regarding your invoice.   Our billing staff will not be able to assist you with questions regarding bills from these companies.  You will be contacted with the lab results as soon as they are available. The fastest way to get your results is to activate your My Chart account. Instructions are located on the last page of this paperwork. If you have not heard from [place] regarding the results in 2 weeks, please contact this office.    smokingSmoking Cessation, Tips for Success If you are ready to quit smoking, congratulations! You have chosen to help yourself be healthier. Cigarettes bring nicotine, tar, carbon monoxide, and other irritants into your body. Your lungs, heart, and blood vessels will be able to work better without these poisons. There are many different ways to quit smoking. Nicotine gum, nicotine patches, a nicotine inhaler, or nicotine nasal spray can help with physical craving. Hypnosis, support groups, and medicines help break the habit of smoking. WHAT THINGS CAN I DO TO MAKE QUITTING EASIER?  Here are some tips to help you quit for good:  Pick a date when you will quit smoking completely. Tell all of your friends and family about your plan to quit on that date.  Do not try to slowly cut down on the number of cigarettes you are smoking. Pick a quit date and quit smoking completely starting on that day.  Throw away all cigarettes.   Clean and remove all ashtrays from your home, work, and car.  On a card, write down your reasons for quitting. Carry the card with you and read it when you get the urge to smoke.  Cleanse  your body of nicotine. Drink enough water and fluids to keep your urine clear or pale yellow. Do this after quitting to flush the nicotine from your body.  Learn to predict your moods. Do not let a bad situation be your excuse to have a cigarette. Some situations in your life might tempt you into wanting a cigarette.  Never have "just one" cigarette. It leads to wanting another and another. Remind yourself of your decision to quit.  Change habits associated with smoking. If you smoked while driving or when feeling stressed, try other activities to replace smoking. Stand up when drinking your coffee. Brush your teeth after eating. Sit in a different chair when you read the paper. Avoid alcohol while trying to quit, and try to drink fewer caffeinated beverages. Alcohol and caffeine may urge you to smoke.  Avoid foods and drinks that can trigger a desire to smoke, such as sugary or spicy foods and alcohol.  Ask people who smoke not to smoke around you.  Have something planned to do right after eating or having a cup of coffee. For example, plan to take a walk or exercise.  Try a relaxation exercise to calm you down and decrease your stress. Remember, you may be tense and nervous for the first 2 weeks after you quit, but this will pass.  Find new activities to keep your hands busy. Play with a pen, coin, or rubber band. Doodle or draw things on paper.  Brush your teeth right after eating. This will help  cut down on the craving for the taste of tobacco after meals. You can also try mouthwash.   Use oral substitutes in place of cigarettes. Try using lemon drops, carrots, cinnamon sticks, or chewing gum. Keep them handy so they are available when you have the urge to smoke.  When you have the urge to smoke, try deep breathing.  Designate your home as a nonsmoking area.  If you are a heavy smoker, ask your health care provider about a prescription for nicotine chewing gum. It can ease your  withdrawal from nicotine.  Reward yourself. Set aside the cigarette money you save and buy yourself something nice.  Look for support from others. Join a support group or smoking cessation program. Ask someone at home or at work to help you with your plan to quit smoking.  Always ask yourself, "Do I need this cigarette or is this just a reflex?" Tell yourself, "Today, I choose not to smoke," or "I do not want to smoke." You are reminding yourself of your decision to quit.  Do not replace cigarette smoking with electronic cigarettes (commonly called e-cigarettes). The safety of e-cigarettes is unknown, and some may contain harmful chemicals.  If you relapse, do not give up! Plan ahead and think about what you will do the next time you get the urge to smoke. HOW WILL I FEEL WHEN I QUIT SMOKING? You may have symptoms of withdrawal because your body is used to nicotine (the addictive substance in cigarettes). You may crave cigarettes, be irritable, feel very hungry, cough often, get headaches, or have difficulty concentrating. The withdrawal symptoms are only temporary. They are strongest when you first quit but will go away within 10-14 days. When withdrawal symptoms occur, stay in control. Think about your reasons for quitting. Remind yourself that these are signs that your body is healing and getting used to being without cigarettes. Remember that withdrawal symptoms are easier to treat than the major diseases that smoking can cause.  Even after the withdrawal is over, expect periodic urges to smoke. However, these cravings are generally short lived and will go away whether you smoke or not. Do not smoke! WHAT RESOURCES ARE AVAILABLE TO HELP ME QUIT SMOKING? Your health care provider can direct you to community resources or hospitals for support, which may include:  Group support.  Education.  Hypnosis.  Therapy.   This information is not intended to replace advice given to you by your health  care provider. Make sure you discuss any questions you have with your health care provider.   Document Released: 11/08/2003 Document Revised: 03/02/2014 Document Reviewed: 07/28/2012 Elsevier Interactive Patient Education [employer].

## 2015-10-04 NOTE — Progress Notes (Signed)
Patient ID: Casey [name], male   DOB: 1965/07/07, 50 y.o.   MRN: [id number]    By signing my name below, I, Essence Howell, attest that this documentation has been prepared under the direction and in the presence of Darlyne Russian, MD Electronically Signed: Ladene Artist, ED Scribe 10/04/2015 at 9:04 AM.  Chief Complaint:  Chief Complaint  Patient presents with  . Follow-up    also need refills   HPI: Casey [name] is a 50 y.o. male who reports to [facility] today complaining of for a follow-up regarding blood work. Pt was last seen in the office on 09/07/15 by colleague Lynne Leader, MD with elevated glucose of 178. He states that he had just eaten prior to the visit but he has been fasting at this visit; last meal was 5 PM yesterday. Pt also wants his cholesterol checked.   Smoking Cessation Pt was prescribed Chantix in January by Silvestre Mesi, MD. He states that he has not had a cigarette in over a month and is ready to stop but is a little nervous. He recently started taking 1 tablet daily instead of 2 daily 4 days ago. He reports side effects of gradually improving diarrhea since February and feeling edgy. He requests a refill at this visit.   Dry Skin Pt requests a refill of Kenalog cream. He reports chronic dry skin to bilateral upper and lower externalities. He has tried several lotions without improvement. Pt has not tried an antihistamine.   No past medical history on file. No past surgical history on file. Social History   Social History  . Marital status: Single    Spouse name: N/A  . Number of children: N/A  . Years of education: N/A   Social History Main Topics  . Smoking status: Current Every Day Smoker  . Smokeless tobacco: None  . Alcohol use None  . Drug use: Unknown  . Sexual activity: Not Asked   Other Topics Concern  . None   Social History Narrative  . None   Family History  Problem Relation Age of Onset  . Stroke Mother   . Hypertension Father     Allergies  Allergen Reactions  . Penicillins Rash    REACTION: had reaction as an infant--cannot recall reaction   Prior to Admission medications   Medication Sig Start Date End Date Taking? Authorizing Provider  lansoprazole (PREVACID) 30 MG capsule Take 1 capsule (30 mg total) by mouth daily at 12 noon. 08/24/15  Yes Wardell Honour, MD  lisinopril (PRINIVIL,ZESTRIL) 10 MG tablet Take 1 tablet (10 mg total) by mouth daily. 09/07/15  Yes Gregor Hams, MD  triamcinolone cream (KENALOG) 0.1 % Apply 1 application topically 2 (two) times daily. Use as needed for eczema 04/06/15  Yes Gay Filler Copland, MD  varenicline (CHANTIX CONTINUING MONTH PAK) 1 MG tablet Take 1 tablet (1 mg total) by mouth 2 (two) times daily. 04/06/15  Yes Gay Filler Copland, MD  varenicline (CHANTIX STARTING MONTH PAK) 0.5 MG X 11 & 1 MG X 42 tablet Take one 0.5 mg tablet by mouth once daily for 3 days, then increase to one 0.5 mg tablet twice daily for 4 days, then increase to one 1 mg tablet twice daily. 04/06/15  Yes Gay Filler Copland, MD   ROS: The patient denies fevers, chills, night sweats, unintentional weight loss, chest pain, palpitations, wheezing, dyspnea on exertion, nausea, vomiting, abdominal pain, dysuria, hematuria, melena, numbness, weakness, or tingling.   All other systems have  been reviewed and were otherwise negative with the exception of those mentioned in the HPI and as above.    PHYSICAL EXAM: Vitals:   10/04/15 0813  BP: 120/80  Pulse: 91  Resp: 16  Temp: 98.2 F (36.8 C)   Body mass index is 32.87 kg/m.  General: Alert, no acute distress HEENT:  Normocephalic, atraumatic, oropharynx patent. Eye: [facility] Cardiovascular:  Regular rate and rhythm, no rubs murmurs or gallops.  No Carotid bruits, radial pulse intact. No pedal edema.  Respiratory: Clear to auscultation bilaterally.  No wheezes, rales, or rhonchi.  No cyanosis, no use of accessory musculature Abdominal: No organomegaly,  abdomen is soft and non-tender, positive bowel sounds.  No masses. Musculoskeletal: Gait intact. No edema, tenderness Skin: eczematous excoriated areas on arms and lower legs  Neurologic: Facial musculature symmetric. Psychiatric: Patient acts appropriately throughout our interaction. Lymphatic: No cervical or submandibular lymphadenopathy  LABS: Results for orders placed or performed in visit on 10/04/15  POCT glucose (manual entry)  Result Value Ref Range   POC Glucose 124 (A) 70 - 99 mg/dl  POCT glycosylated hemoglobin (Hb A1C)  Result Value Ref Range   Hemoglobin A1C 5.5   POCT Skin KOH  Result Value Ref Range   Skin KOH, POC Negative     EKG/XRAY:   Primary read interpreted by Dr. Everlene Farrier at [facility].  ASSESSMENT/PLAN: I advised patient to schedule complete physical. His hemoglobin A1c is 5.5 but his fasting sugar was 124 so he may be trending towards prediabetes. He was instructed about this. I did refill his Chantix to taper off over the next month. He was also given triamcinolone Eucerin cream to use for his eczema.I personally performed the services described in this documentation, which was scribed in my presence. The recorded information has been reviewed and is accurate.   Gross sideeffects, risk and benefits, and alternatives of medications d/w patient. Patient is aware that all medications have potential sideeffects and we are unable to predict every sideeffect or drug-drug interaction that may occur.  Arlyss Queen MD 10/04/2015 8:30 AM

## 2015-10-09 DIAGNOSIS — Z23 Encounter for immunization: Secondary | ICD-10-CM | POA: Diagnosis not present

## 2016-01-25 DIAGNOSIS — G44219 Episodic tension-type headache, not intractable: Secondary | ICD-10-CM | POA: Diagnosis not present

## 2016-01-25 DIAGNOSIS — H40033 Anatomical narrow angle, bilateral: Secondary | ICD-10-CM | POA: Diagnosis not present

## 2016-10-14 ENCOUNTER — Other Ambulatory Visit: Payer: Self-pay | Admitting: Emergency Medicine

## 2016-10-14 DIAGNOSIS — I10 Essential (primary) hypertension: Secondary | ICD-10-CM

## 2016-10-14 NOTE — Telephone Encounter (Signed)
Refill req Lisinopril Last ov 09/2015 - seen by Daub Sent #[phone] - sent to Schedulers to call for appt

## 2016-10-15 NOTE — Telephone Encounter (Signed)
mychart message sent to pt about making an apt for more refills

## 2016-10-19 ENCOUNTER — Encounter: Payer: Self-pay | Admitting: Physician Assistant

## 2016-10-19 ENCOUNTER — Ambulatory Visit (INDEPENDENT_AMBULATORY_CARE_PROVIDER_SITE_OTHER): Payer: BLUE CROSS/BLUE SHIELD | Admitting: Physician Assistant

## 2016-10-19 VITALS — BP 156/96 | HR 75 | Temp 98.3°F | Resp 16 | Ht 73.5 in | Wt 260.0 lb

## 2016-10-19 DIAGNOSIS — R319 Hematuria, unspecified: Secondary | ICD-10-CM

## 2016-10-19 DIAGNOSIS — B369 Superficial mycosis, unspecified: Secondary | ICD-10-CM | POA: Diagnosis not present

## 2016-10-19 DIAGNOSIS — Z1211 Encounter for screening for malignant neoplasm of colon: Secondary | ICD-10-CM

## 2016-10-19 DIAGNOSIS — I10 Essential (primary) hypertension: Secondary | ICD-10-CM | POA: Diagnosis not present

## 2016-10-19 DIAGNOSIS — L858 Other specified epidermal thickening: Secondary | ICD-10-CM | POA: Diagnosis not present

## 2016-10-19 DIAGNOSIS — Z23 Encounter for immunization: Secondary | ICD-10-CM | POA: Diagnosis not present

## 2016-10-19 DIAGNOSIS — M7989 Other specified soft tissue disorders: Secondary | ICD-10-CM

## 2016-10-19 MED ORDER — LISINOPRIL-HYDROCHLOROTHIAZIDE 20-12.5 MG PO TABS: 1.0000 | ORAL_TABLET | Freq: Every day | ORAL | 3 refills | Status: DC

## 2016-10-19 MED ORDER — FLUCONAZOLE 150 MG PO TABS: 150.0000 mg | ORAL_TABLET | Freq: Once | ORAL | 0 refills | Status: AC

## 2016-10-19 MED ORDER — FLUCONAZOLE 150 MG PO TABS: 150.0000 mg | ORAL_TABLET | Freq: Once | ORAL | 0 refills | Status: DC

## 2016-10-19 MED ORDER — TRIAMCINOLONE ACETONIDE 0.025 % EX OINT: 1.0000 "application " | TOPICAL_OINTMENT | Freq: Two times a day (BID) | CUTANEOUS | 0 refills | Status: DC

## 2016-10-19 NOTE — Progress Notes (Signed)
10/19/2016 10:30 AM   DOB: Jun 30, 1965 / MRN: [id number]  SUBJECTIVE:  Casey [name] is a 51 y.o. male presenting for hypertension. Was a smoker for roughly 35 years at roughly 1 pack daily. Would like to proceed with a colonoscopy today. 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  Has a rash about the bilateral legs and admits to some leg swelling.  Has spots on his truck as well that are getting larger and tells me he is getting more and more. They are hyperpigmented but don't really itch.    He is allergic to penicillins.   He  has no past medical history on file.    He  reports that he has been smoking.  He has never used smokeless tobacco. He  has no sexual activity history on file. The patient  has no past surgical history on file.  His family history includes Hypertension in his father; Stroke in his mother.  Review of Systems  Constitutional: Negative for chills, diaphoresis and fever.  Eyes: Negative.   Respiratory: Negative for cough, hemoptysis, sputum production, shortness of breath and wheezing.   Cardiovascular: Negative for chest pain, orthopnea and leg swelling.  Gastrointestinal: Negative for nausea.  Skin: Negative for rash.  Neurological: Negative for dizziness, sensory change, speech change, focal weakness and headaches.    The problem list and medications were reviewed and updated by myself where necessary and exist elsewhere in the encounter.   OBJECTIVE:  BP (!) 156/96   Pulse 75   Temp 98.3 F (36.8 C) (Oral)   Resp 16   Ht 6' 1.5" (1.867 m)   Wt 260 lb (117.9 kg)   SpO2 97%   BMI 33.84 kg/m   Lab Results  Component Value Date   CREATININE 0.91 10/04/2015   BUN 9 10/04/2015   NA 139 10/04/2015   K 4.1 10/04/2015   CL 104 10/04/2015   CO2 26 10/04/2015   Lab Results  Component Value Date   HGBA1C 5.5 10/04/2015   Lab Results  Component Value Date   ALT 34 01/14/2015   AST 28  01/14/2015   ALKPHOS 78 01/14/2015   BILITOT 1.0 01/14/2015      BP Readings from Last 3 Encounters:  10/19/16 (!) 156/96  10/04/15 120/80  09/07/15 138/82   Wt Readings from Last 3 Encounters:  10/19/16 260 lb (117.9 kg)  10/04/15 256 lb (116.1 kg)  09/07/15 255 lb (115.7 kg)     Physical Exam  Constitutional: He is oriented to person, place, and time. He appears well-developed. He is active and cooperative.  Non-toxic appearance.  Cardiovascular: Normal rate, regular rhythm, S1 normal, S2 normal, normal heart sounds, intact distal pulses and normal pulses.  Exam reveals no gallop and no friction rub.   No murmur heard. Pulmonary/Chest: Effort normal. No stridor. No tachypnea. No respiratory distress. He has no wheezes. He has no rales.    Abdominal: He exhibits no distension.  Musculoskeletal: He exhibits edema (Trace). He exhibits no tenderness or deformity.  Neurological: He is alert and oriented to person, place, and time.  Skin: Skin is warm and dry. He is not diaphoretic. No pallor.     Vitals reviewed.   No results found for this or any previous visit (from the past 72 hour(s)).  No results found.  ASSESSMENT AND PLAN:  Casey [name] was seen today for hypertension and medication refill.  Diagnoses and  all orders for this visit:  Uncontrolled hypertension: Given leg swelling advised we add a diuretic.  He will see derm for his multiple skin complaints.  Will treat for fungal rash for now.  -     lisinopril-hydrochlorothiazide (ZESTORETIC) 20-12.5 MG tablet; Take 1 tablet by mouth daily. -     CBC with Differential/Platelet -     CMP14+EGFR -     Urinalysis, dipstick only  Flu vaccine need -     Flu Vaccine QUAD 36+ mos IM  Fungal rash of trunk -     triamcinolone (KENALOG) 0.025 % ointment; Apply 1 application topically 2 (two) times daily. -     fluconazole (DIFLUCAN) 150 MG tablet; Take 1 tablet (150 mg total) by mouth once. Repeat if needed  Keratosis  pilaris  Leg swelling -     Ambulatory referral to Dermatology  Special screening for malignant neoplasms, colon -     Ambulatory referral to Gastroenterology    The patient is advised to call or return to clinic if he does not see an improvement in symptoms, or to seek the care of the closest emergency department if he worsens with the above plan.   Philis Fendt, MHS, PA-C [facility] 10/19/2016 10:30 AM

## 2016-10-19 NOTE — Patient Instructions (Addendum)
Exercise improves every system in the body.  It lowers the risk of heart disease, decreases blood pressure, reduces the symptoms of depression and anxiety, and lowers blood sugar. To receive these benefits, try to get 150 minutes of planned exercise each week.  You can break this 150 minutes up however you like.  For instance, you can perform 30 minutes of brisk walking 5 days a week, or perform 50 minutes 3 days a week.  If you don't like walking, or can't find a safe place to walk, find another way to move that you can enjoy.  Exercise tapes, cycling, stair climbing, swimming, or a combination will be just as good as a walking program. To ensure the proper intensity, you can use the talk test. Essentially, you should be able to carry on a conversation, but you should have to take short breaks from the conversation in order catch your breath.  Read In Defense of Food by Merlinda Frederick.    IF you received an x-ray today, you will receive an invoice from [facility]. Please contact [facility] at [phone] with questions or concerns regarding your invoice.   IF you received labwork today, you will receive an invoice from [facility]. Please contact [facility] at [phone] with questions or concerns regarding your invoice.   Our billing staff will not be able to assist you with questions regarding bills from these companies.  You will be contacted with the lab results as soon as they are available. The fastest way to get your results is to activate your My Chart account. Instructions are located on the last page of this paperwork. If you have not heard from [place] regarding the results in 2 weeks, please contact this office.

## 2016-10-20 LAB — CMP14+EGFR
A/G RATIO: 1.4 (ref 1.2–2.2)
ALBUMIN: 4.2 g/dL (ref 3.5–5.5)
ALT: 54 IU/L — AB (ref 0–44)
AST: 44 IU/L — ABNORMAL HIGH (ref 0–40)
Alkaline Phosphatase: 63 IU/L (ref 39–117)
BILIRUBIN TOTAL: 0.8 mg/dL (ref 0.0–1.2)
BUN/Creatinine Ratio: 8 — ABNORMAL LOW (ref 9–20)
BUN: 7 mg/dL (ref 6–24)
CALCIUM: 9.7 mg/dL (ref 8.7–10.2)
CO2: 20 mmol/L (ref 20–29)
CREATININE: 0.88 mg/dL (ref 0.76–1.27)
Chloride: 102 mmol/L (ref 96–106)
GFR, EST AFRICAN AMERICAN: 115 mL/min/{1.73_m2} (ref >59)
GFR, EST NON AFRICAN AMERICAN: 99 mL/min/{1.73_m2} (ref >59)
Globulin, Total: 3 g/dL (ref 1.5–4.5)
Glucose: 149 mg/dL — ABNORMAL HIGH (ref 65–99)
POTASSIUM: 4.2 mmol/L (ref 3.5–5.2)
Sodium: 137 mmol/L (ref 134–144)
TOTAL PROTEIN: 7.2 g/dL (ref 6.0–8.5)

## 2016-10-20 LAB — CBC WITH DIFFERENTIAL/PLATELET
Basophils Absolute: 0 10*3/uL (ref 0.0–0.2)
Basos: 1 %
EOS (ABSOLUTE): 0.2 10*3/uL (ref 0.0–0.4)
EOS: 3 %
HEMATOCRIT: 47.3 % (ref 37.5–51.0)
HEMOGLOBIN: 16.6 g/dL (ref 13.0–17.7)
IMMATURE GRANS (ABS): 0 10*3/uL (ref 0.0–0.1)
IMMATURE GRANULOCYTES: 0 %
LYMPHS ABS: 1.9 10*3/uL (ref 0.7–3.1)
LYMPHS: 35 %
MCH: 32.8 pg (ref 26.6–33.0)
MCHC: 35.1 g/dL (ref 31.5–35.7)
MCV: 94 fL (ref 79–97)
MONOCYTES: 6 %
Monocytes Absolute: 0.3 10*3/uL (ref 0.1–0.9)
Neutrophils Absolute: 3 10*3/uL (ref 1.4–7.0)
Neutrophils: 55 %
Platelets: 173 10*3/uL (ref 150–379)
RBC: 5.06 x10E6/uL (ref 4.14–5.80)
RDW: 13 % (ref 12.3–15.4)
WBC: 5.4 10*3/uL (ref 3.4–10.8)

## 2016-10-20 LAB — URINALYSIS, DIPSTICK ONLY
BILIRUBIN UA: NEGATIVE
Ketones, UA: NEGATIVE
Leukocytes, UA: NEGATIVE
NITRITE UA: NEGATIVE
PH UA: 5 (ref 5.0–7.5)
Protein, UA: NEGATIVE
Specific Gravity, UA: 1.015 (ref 1.005–1.030)
UUROB: 0.2 mg/dL (ref 0.2–1.0)

## 2016-10-22 ENCOUNTER — Other Ambulatory Visit: Payer: Self-pay | Admitting: Physician Assistant

## 2016-10-22 ENCOUNTER — Encounter: Payer: Self-pay | Admitting: Gastroenterology

## 2016-10-22 DIAGNOSIS — R319 Hematuria, unspecified: Secondary | ICD-10-CM

## 2016-10-23 LAB — HEPATITIS PANEL, ACUTE
HEP B C IGM: NEGATIVE
Hep A IgM: NEGATIVE
Hepatitis B Surface Ag: NEGATIVE

## 2016-10-23 LAB — HGB A1C W/O EAG: Hgb A1c MFr Bld: 6 % — ABNORMAL HIGH (ref 4.8–5.6)

## 2016-10-23 LAB — SPECIMEN STATUS REPORT

## 2016-10-28 NOTE — Addendum Note (Signed)
Addended by: Areta Haber on: 10/28/2016 03:10 PM   Modules accepted: Orders

## 2016-12-21 ENCOUNTER — Encounter: Payer: Self-pay | Admitting: Gastroenterology

## 2017-01-09 ENCOUNTER — Other Ambulatory Visit: Payer: Self-pay | Admitting: Physician Assistant

## 2017-01-09 DIAGNOSIS — B369 Superficial mycosis, unspecified: Secondary | ICD-10-CM

## 2017-01-23 DIAGNOSIS — H1013 Acute atopic conjunctivitis, bilateral: Secondary | ICD-10-CM | POA: Diagnosis not present

## 2017-01-23 DIAGNOSIS — H40033 Anatomical narrow angle, bilateral: Secondary | ICD-10-CM | POA: Diagnosis not present

## 2017-01-25 ENCOUNTER — Ambulatory Visit: Payer: BLUE CROSS/BLUE SHIELD | Admitting: Physician Assistant

## 2017-01-25 ENCOUNTER — Encounter: Payer: Self-pay | Admitting: Physician Assistant

## 2017-01-25 ENCOUNTER — Other Ambulatory Visit: Payer: Self-pay

## 2017-01-25 VITALS — BP 116/76 | HR 80 | Temp 98.3°F | Resp 16 | Ht 73.5 in | Wt 269.8 lb

## 2017-01-25 DIAGNOSIS — L2084 Intrinsic (allergic) eczema: Secondary | ICD-10-CM | POA: Diagnosis not present

## 2017-01-25 DIAGNOSIS — R319 Hematuria, unspecified: Secondary | ICD-10-CM | POA: Diagnosis not present

## 2017-01-25 DIAGNOSIS — R7309 Other abnormal glucose: Secondary | ICD-10-CM | POA: Diagnosis not present

## 2017-01-25 DIAGNOSIS — I10 Essential (primary) hypertension: Secondary | ICD-10-CM | POA: Diagnosis not present

## 2017-01-25 LAB — POCT URINALYSIS DIP (MANUAL ENTRY)
BILIRUBIN UA: NEGATIVE
Glucose, UA: NEGATIVE mg/dL
Ketones, POC UA: NEGATIVE mg/dL
Leukocytes, UA: NEGATIVE
NITRITE UA: NEGATIVE
PH UA: 5.5 (ref 5.0–8.0)
PROTEIN UA: NEGATIVE mg/dL
Spec Grav, UA: 1.025 (ref 1.010–1.025)
Urobilinogen, UA: 0.2 E.U./dL

## 2017-01-25 LAB — CMP AND LIVER
ALBUMIN: 4.1 g/dL (ref 3.5–5.5)
ALT: 44 IU/L (ref 0–44)
AST: 35 IU/L (ref 0–40)
Alkaline Phosphatase: 61 IU/L (ref 39–117)
BUN: 10 mg/dL (ref 6–24)
Bilirubin Total: 0.8 mg/dL (ref 0.0–1.2)
Bilirubin, Direct: 0.25 mg/dL (ref 0.00–0.40)
CALCIUM: 9.7 mg/dL (ref 8.7–10.2)
CO2: 22 mmol/L (ref 20–29)
CREATININE: 0.94 mg/dL (ref 0.76–1.27)
Chloride: 99 mmol/L (ref 96–106)
GFR calc Af Amer: 108 mL/min/{1.73_m2} (ref >59)
GFR calc non Af Amer: 93 mL/min/{1.73_m2} (ref >59)
Glucose: 200 mg/dL — ABNORMAL HIGH (ref 65–99)
Potassium: 4.1 mmol/L (ref 3.5–5.2)
Sodium: 137 mmol/L (ref 134–144)
TOTAL PROTEIN: 6.7 g/dL (ref 6.0–8.5)

## 2017-01-25 LAB — HEMOGLOBIN A1C
ESTIMATED AVERAGE GLUCOSE: 140 mg/dL
Hgb A1c MFr Bld: 6.5 % — ABNORMAL HIGH (ref 4.8–5.6)

## 2017-01-25 MED ORDER — TRIAMCINOLONE ACETONIDE 0.1 % EX CREA: 1.0000 "application " | TOPICAL_CREAM | Freq: Two times a day (BID) | CUTANEOUS | 11 refills | Status: DC

## 2017-01-25 NOTE — Patient Instructions (Signed)
     IF you received an x-ray today, you will receive an invoice from [facility]. Please contact [facility] at [phone] with questions or concerns regarding your invoice.   IF you received labwork today, you will receive an invoice from [facility]. Please contact [facility] at [phone] with questions or concerns regarding your invoice.   Our billing staff will not be able to assist you with questions regarding bills from these companies.  You will be contacted with the lab results as soon as they are available. The fastest way to get your results is to activate your My Chart account. Instructions are located on the last page of this paperwork. If you have not heard from us regarding the results in 2 weeks, please contact this office.     

## 2017-01-25 NOTE — Progress Notes (Signed)
01/25/2017 9:11 AM   DOB: [birth year] / MRN: [id number]  SUBJECTIVE:  Casey [name] is a 51 y.o. male presenting for HTN follow up.  Pressures much improved with the addition of a diuretic. Weight is up today, patient attributes to increased calorie consumption.   Tells me his rash is no better, particularly on the left anterior leg. Has a derm referral in place.  Triamcinolone has helped in the past as has pred.   Complains of dry mouth that is occurring more consistently.   Feels confident he has had his TD in the last 10 years.   Immunization History  Administered Date(s) Administered  . Influenza,inj,Quad PF,6+ Mos 10/19/2016  . Influenza-Unspecified 01/04/2015     He is allergic to penicillins.   He  has a past medical history of Hyperglycemia (09/07/2015) and SINUSITIS, ACUTE (03/08/2008).    He  reports that he has been smoking.  he has never used smokeless tobacco. He  has no sexual activity history on file. The patient  has no past surgical history on file.  His family history includes Hypertension in his father; Stroke in his mother.  Review of Systems  Constitutional: Negative for chills, diaphoresis and fever.  Eyes: Negative.   Respiratory: Negative for cough, hemoptysis, sputum production, shortness of breath and wheezing.   Cardiovascular: Negative for chest pain, orthopnea and leg swelling.  Gastrointestinal: Negative for nausea.  Skin: Negative for rash.  Neurological: Negative for dizziness, sensory change, speech change, focal weakness and headaches.    The problem list and medications were reviewed and updated by myself where necessary and exist elsewhere in the encounter.   OBJECTIVE:  BP 116/76 (BP Location: Right Arm, Patient Position: Sitting, Cuff Size: Large)   Pulse 80   Temp 98.3 F (36.8 C) (Oral)   Resp 16   Ht 6' 1.5" (1.867 m)   Wt 269 lb 12.8 oz (122.4 kg)   SpO2 98%   BMI 35.11 kg/m   Physical Exam  Constitutional: He is oriented  to person, place, and time. He appears well-developed. He is active and cooperative.  Non-toxic appearance.  Eyes: EOM are normal. Pupils are equal, round, and reactive to light.  Cardiovascular: Normal rate, regular rhythm, S1 normal, S2 normal, normal heart sounds, intact distal pulses and normal pulses. Exam reveals no gallop and no friction rub.  No murmur heard. Pulmonary/Chest: Effort normal. No stridor. No tachypnea. No respiratory distress. He has no wheezes. He has no rales.  Abdominal: He exhibits no distension.  Musculoskeletal: He exhibits no edema.  Neurological: He is alert and oriented to person, place, and time. He has normal strength and normal reflexes. He is not disoriented. No cranial nerve deficit or sensory deficit. He exhibits normal muscle tone. Coordination and gait normal.  Skin: Skin is warm and dry. He is not diaphoretic. No pallor.  Psychiatric: His behavior is normal.  Vitals reviewed.   Wt Readings from Last 3 Encounters:  01/25/17 269 lb 12.8 oz (122.4 kg)  10/19/16 260 lb (117.9 kg)  10/04/15 256 lb (116.1 kg)   Temp Readings from Last 3 Encounters:  01/25/17 98.3 F (36.8 C) (Oral)  10/19/16 98.3 F (36.8 C) (Oral)  10/04/15 98.2 F (36.8 C) (Oral)   BP Readings from Last 3 Encounters:  01/25/17 116/76  10/19/16 (!) 156/96  10/04/15 120/80   Pulse Readings from Last 3 Encounters:  01/25/17 80  10/19/16 75  10/04/15 91   Lab Results  Component Value Date  HGBA1C 6.0 (H) 10/19/2016     Results for orders placed or performed in visit on 01/25/17 (from the past 72 hour(s))  POCT urinalysis dipstick     Status: Abnormal   Collection Time: 01/25/17  8:48 AM  Result Value Ref Range   Color, UA yellow yellow   Clarity, UA clear clear   Glucose, UA negative negative mg/dL   Bilirubin, UA negative negative   Ketones, POC UA negative negative mg/dL   Spec Grav, UA 1.025 1.010 - 1.025   Blood, UA moderate (A) negative   pH, UA 5.5 5.0 - 8.0     Protein Ur, POC negative negative mg/dL   Urobilinogen, UA 0.2 0.2 or 1.0 E.U./dL   Nitrite, UA Negative Negative   Leukocytes, UA Negative Negative    No results found.  ASSESSMENT AND PLAN:  Khyran was seen today for follow-up.  Diagnoses and all orders for this visit:  Uncontrolled hypertension Comments: Pressure improved today.  Will follow up in about 6 months.  Orders: -     CMP and Liver -     POCT urinalysis dipstick  Elevated hemoglobin A1c -     Hemoglobin A1c  Intrinsic eczema -     triamcinolone cream (KENALOG) 0.1 %; Apply 1 application topically 2 (two) times daily.  Hematuria, unspecified type -     Ambulatory referral to Urology    The patient is advised to call or return to clinic if he does not see an improvement in symptoms, or to seek the care of the closest emergency department if he worsens with the above plan.   Philis Fendt, MHS, PA-C [facility] 01/25/2017 9:11 AM

## 2017-01-27 ENCOUNTER — Other Ambulatory Visit: Payer: Self-pay

## 2017-01-27 ENCOUNTER — Ambulatory Visit (AMBULATORY_SURGERY_CENTER): Payer: Self-pay | Admitting: *Deleted

## 2017-01-27 VITALS — Ht 73.5 in | Wt 266.0 lb

## 2017-01-27 DIAGNOSIS — Z1211 Encounter for screening for malignant neoplasm of colon: Secondary | ICD-10-CM

## 2017-01-27 MED ORDER — NA SULFATE-K SULFATE-MG SULF 17.5-3.13-1.6 GM/177ML PO SOLN: ORAL | 0 refills | Status: DC

## 2017-01-27 NOTE — Progress Notes (Signed)
Patient denies any allergies to eggs or soy. Patient denies any problems with anesthesia/sedation. Patient denies any oxygen use at home. Patient denies taking any diet/weight loss medications or blood thinners. EMMI education assisgned to patient on colonoscopy, this was explained and instructions given to patient. 

## 2017-02-03 ENCOUNTER — Telehealth: Payer: Self-pay | Admitting: Physician Assistant

## 2017-02-03 NOTE — Telephone Encounter (Signed)
Spoke with pt to let him know we could either send him to [facility] in [place] which is scheduling in the beginning of January, or we can send to [facility] in [facility] and schedule in late December. Pt said he preferred [facility] in [place]. I have sent referral and let pt know this as well as gave him their phone number in case he does not hear from them in the next couple of days.

## 2017-02-03 NOTE — Telephone Encounter (Signed)
Referral question sent to [facility]

## 2017-02-03 NOTE — Telephone Encounter (Signed)
Copied from [place]. Topic: Referral - Status >> Feb 03, 2017 11:06 AM Cleaster Corin, NT wrote: CRM for notification. See Telephone encounter for:   02/03/17.  Reason for CRM: pt. Calling to get status update on urologist referral that was made by Dr. Carlis Abbott please call pt. With any updates. [phone]

## 2017-02-06 DIAGNOSIS — H10013 Acute follicular conjunctivitis, bilateral: Secondary | ICD-10-CM | POA: Diagnosis not present

## 2017-02-10 ENCOUNTER — Other Ambulatory Visit: Payer: Self-pay

## 2017-02-10 ENCOUNTER — Ambulatory Visit (AMBULATORY_SURGERY_CENTER): Payer: BLUE CROSS/BLUE SHIELD | Admitting: Gastroenterology

## 2017-02-10 ENCOUNTER — Encounter: Payer: Self-pay | Admitting: Gastroenterology

## 2017-02-10 VITALS — BP 102/72 | HR 73 | Temp 98.0°F | Resp 12 | Ht 73.5 in | Wt 266.0 lb

## 2017-02-10 DIAGNOSIS — D125 Benign neoplasm of sigmoid colon: Secondary | ICD-10-CM

## 2017-02-10 DIAGNOSIS — K635 Polyp of colon: Secondary | ICD-10-CM | POA: Diagnosis not present

## 2017-02-10 DIAGNOSIS — Z1211 Encounter for screening for malignant neoplasm of colon: Secondary | ICD-10-CM

## 2017-02-10 DIAGNOSIS — D120 Benign neoplasm of cecum: Secondary | ICD-10-CM

## 2017-02-10 DIAGNOSIS — D123 Benign neoplasm of transverse colon: Secondary | ICD-10-CM

## 2017-02-10 DIAGNOSIS — Z1212 Encounter for screening for malignant neoplasm of rectum: Secondary | ICD-10-CM

## 2017-02-10 HISTORY — PX: COLONOSCOPY WITH PROPOFOL: SHX5780

## 2017-02-10 MED ORDER — SODIUM CHLORIDE 0.9 % IV SOLN: 500.0000 mL | Freq: Once | INTRAVENOUS | Status: DC

## 2017-02-10 NOTE — Patient Instructions (Signed)
Discharge instructions given. Handouts on polyps and hemorrhoids. Resume previous medications. No ibuprofen, naproxen, or other NSAIDs for two weeks. YOU HAD AN ENDOSCOPIC PROCEDURE TODAY AT [facility]:   Refer to the procedure report that was given to you for any specific questions about what was found during the examination.  If the procedure report does not answer your questions, please call your gastroenterologist to clarify.  If you requested that your care partner not be given the details of your procedure findings, then the procedure report has been included in a sealed envelope for you to review at your convenience later.  YOU SHOULD EXPECT: Some feelings of bloating in the abdomen. Passage of more gas than usual.  Walking can help get rid of the air that was put into your GI tract during the procedure and reduce the bloating. If you had a lower endoscopy (such as a colonoscopy or flexible sigmoidoscopy) you may notice spotting of blood in your stool or on the toilet paper. If you underwent a bowel prep for your procedure, you may not have a normal bowel movement for a few days.  Please Note:  You might notice some irritation and congestion in your nose or some drainage.  This is from the oxygen used during your procedure.  There is no need for concern and it should clear up in a day or so.  SYMPTOMS TO REPORT IMMEDIATELY:   Following lower endoscopy (colonoscopy or flexible sigmoidoscopy):  Excessive amounts of blood in the stool  Significant tenderness or worsening of abdominal pains  Swelling of the abdomen that is new, acute  Fever of 100F or higher   For urgent or emergent issues, a gastroenterologist can be reached at any hour by calling [phone].   DIET:  We do recommend a small meal at first, but then you may proceed to your regular diet.  Drink plenty of fluids but you should avoid alcoholic beverages for 24 hours.  ACTIVITY:  You should plan to take  it easy for the rest of today and you should NOT DRIVE or use heavy machinery until tomorrow (because of the sedation medicines used during the test).    FOLLOW UP: Our staff will call the number listed on your records the next business day following your procedure to check on you and address any questions or concerns that you may have regarding the information given to you following your procedure. If we do not reach you, we will leave a message.  However, if you are feeling well and you are not experiencing any problems, there is no need to return our call.  We will assume that you have returned to your regular daily activities without incident.  If any biopsies were taken you will be contacted by phone or by letter within the next 1-3 weeks.  Please call us at [phone] if you have not heard about the biopsies in 3 weeks.    SIGNATURES/CONFIDENTIALITY: You and/or your care partner have signed paperwork which will be entered into your electronic medical record.  These signatures attest to the fact that that the information above on your After Visit Summary has been reviewed and is understood.  Full responsibility of the confidentiality of this discharge information lies with you and/or your care-partner.

## 2017-02-10 NOTE — Op Note (Signed)
[place] Patient Name: Casey [name] Procedure Date: 02/10/2017 10:43 AM MRN: [id number] Endoscopist: Remo Lipps P. Toshika Parrow MD, MD Age: 51 Referring MD:  Date of Birth: 04/07/1965 Gender: Male Account #: [id number] Procedure:                Colonoscopy Indications:              Screening for colorectal malignant neoplasm, This                            is the patient's first colonoscopy Medicines:                Monitored Anesthesia Care Procedure:                Pre-Anesthesia Assessment:                           - Prior to the procedure, a History and Physical                            was performed, and patient medications and                            allergies were reviewed. The patient's tolerance of                            previous anesthesia was also reviewed. The risks                            and benefits of the procedure and the sedation                            options and risks were discussed with the patient.                            All questions were answered, and informed consent                            was obtained. Prior Anticoagulants: The patient has                            taken no previous anticoagulant or antiplatelet                            agents. ASA Grade Assessment: II - A patient with                            mild systemic disease. After reviewing the risks                            and benefits, the patient was deemed in                            satisfactory condition to undergo the procedure.  After obtaining informed consent, the colonoscope                            was passed under direct vision. Throughout the                            procedure, the patient's blood pressure, pulse, and                            oxygen saturations were monitored continuously. The                            Colonoscope was introduced through the anus and                            advanced to the the  cecum, identified by                            appendiceal orifice and ileocecal valve. The                            colonoscopy was performed without difficulty. The                            patient tolerated the procedure well. The quality                            of the bowel preparation was good. The ileocecal                            valve, appendiceal orifice, and rectum were                            photographed. Scope In: 10:53:25 AM Scope Out: 24:58:09 AM Scope Withdrawal Time: 0 hours 14 minutes 49 seconds  Total Procedure Duration: 0 hours 17 minutes 53 seconds  Findings:                 The perianal and digital rectal examinations were                            normal.                           A 3 mm polyp was found in the cecum. The polyp was                            sessile. The polyp was removed with a cold snare.                            Resection and retrieval were complete.                           Two sessile polyps were found in the transverse  colon. The polyps were 3 to 4 mm in size. These                            polyps were removed with a cold snare. Resection                            and retrieval were complete.                           A 5 mm polyp was found in the descending colon. The                            polyp was sessile. The polyp was removed with a                            cold snare. Resection and retrieval were complete.                           Internal hemorrhoids were found during retroflexion.                           The exam was otherwise without abnormality. Complications:            No immediate complications. Estimated blood loss:                            Minimal. Estimated Blood Loss:     Estimated blood loss was minimal. Impression:               - One 3 mm polyp in the cecum, removed with a cold                            snare. Resected and retrieved.                           -  Two 3 to 4 mm polyps in the transverse colon,                            removed with a cold snare. Resected and retrieved.                           - One 5 mm polyp in the descending colon, removed                            with a cold snare. Resected and retrieved.                           - Internal hemorrhoids.                           - The examination was otherwise normal. Recommendation:           - Patient has a contact number available for  emergencies. The signs and symptoms of potential                            delayed complications were discussed with the                            patient. Return to normal activities tomorrow.                            Written discharge instructions were provided to the                            patient.                           - Resume previous diet.                           - Continue present medications.                           - Await pathology results.                           - Repeat colonoscopy is recommended for                            surveillance. The colonoscopy date will be                            determined after pathology results from today's                            exam become available for review.                           - No ibuprofen, naproxen, or other non-steroidal                            anti-inflammatory drugs for 2 weeks after polyp                            removal. Remo Lipps P. Fanta Wimberley MD, MD 02/10/2017 11:15:02 AM This report has been signed electronically.

## 2017-02-10 NOTE — Progress Notes (Signed)
Report given to PACU, vss 

## 2017-02-10 NOTE — Progress Notes (Signed)
Called to room to assist during endoscopic procedure.  Patient ID and intended procedure confirmed with present staff. Received instructions for my participation in the procedure from the performing physician.  

## 2017-02-11 ENCOUNTER — Telehealth: Payer: Self-pay

## 2017-02-11 NOTE — Telephone Encounter (Signed)
Left message on answering machine. 

## 2017-02-18 ENCOUNTER — Encounter: Payer: Self-pay | Admitting: Gastroenterology

## 2017-03-10 DIAGNOSIS — R311 Benign essential microscopic hematuria: Secondary | ICD-10-CM | POA: Diagnosis not present

## 2017-03-25 DIAGNOSIS — L309 Dermatitis, unspecified: Secondary | ICD-10-CM | POA: Diagnosis not present

## 2017-03-25 DIAGNOSIS — L821 Other seborrheic keratosis: Secondary | ICD-10-CM | POA: Diagnosis not present

## 2017-03-25 DIAGNOSIS — I872 Venous insufficiency (chronic) (peripheral): Secondary | ICD-10-CM | POA: Diagnosis not present

## 2017-03-26 DIAGNOSIS — R311 Benign essential microscopic hematuria: Secondary | ICD-10-CM | POA: Diagnosis not present

## 2017-03-26 DIAGNOSIS — N200 Calculus of kidney: Secondary | ICD-10-CM | POA: Diagnosis not present

## 2017-03-30 DIAGNOSIS — R311 Benign essential microscopic hematuria: Secondary | ICD-10-CM | POA: Diagnosis not present

## 2017-06-14 ENCOUNTER — Other Ambulatory Visit: Payer: Self-pay

## 2017-06-14 ENCOUNTER — Encounter: Payer: Self-pay | Admitting: Physician Assistant

## 2017-06-14 ENCOUNTER — Ambulatory Visit: Payer: BLUE CROSS/BLUE SHIELD | Admitting: Physician Assistant

## 2017-06-14 ENCOUNTER — Ambulatory Visit (INDEPENDENT_AMBULATORY_CARE_PROVIDER_SITE_OTHER): Payer: BLUE CROSS/BLUE SHIELD

## 2017-06-14 VITALS — BP 110/78 | HR 72 | Temp 97.9°F | Resp 18 | Ht 73.5 in | Wt 260.6 lb

## 2017-06-14 DIAGNOSIS — R918 Other nonspecific abnormal finding of lung field: Secondary | ICD-10-CM | POA: Diagnosis not present

## 2017-06-14 DIAGNOSIS — R911 Solitary pulmonary nodule: Secondary | ICD-10-CM

## 2017-06-14 NOTE — Progress Notes (Signed)
06/14/2017 8:37 AM   DOB: 02-01-1966 / MRN: [id number]  SUBJECTIVE:  Casey [name] is a 52 y.o. male presenting for follow up of pulmonary nodule on 03/26/2017.  3 mm subpleural nodule in the left lower lobe.  This was found on a CT stone protocol.  He denies night sweats and weight loss.    Patient is actively trying to lose weight and is exercising at this time.  Does not want to check labs today.   He is allergic to fish allergy and penicillins.   He  has a past medical history of Hyperglycemia (09/07/2015) and SINUSITIS, ACUTE (03/08/2008).    He  reports that he quit smoking about 18 months ago. He has never used smokeless tobacco. He reports that he does not drink alcohol or use drugs. He  has no sexual activity history on file. The patient  has a past surgical history that includes Appendectomy (2005) and Wisdom tooth extraction.  His family history includes Hypertension in his father; Stroke in his mother.  Review of Systems  Constitutional: Negative for chills, diaphoresis and fever.  Eyes: Negative.   Respiratory: Negative for cough, hemoptysis, sputum production, shortness of breath and wheezing.   Cardiovascular: Negative for chest pain, orthopnea and leg swelling.  Gastrointestinal: Negative for abdominal pain, blood in stool, constipation, diarrhea, heartburn, melena, nausea and vomiting.  Genitourinary: Negative for dysuria, flank pain, frequency, hematuria and urgency.  Skin: Negative for rash.  Neurological: Negative for dizziness, sensory change, speech change, focal weakness and headaches.    The problem list and medications were reviewed and updated by myself where necessary and exist elsewhere in the encounter.   OBJECTIVE:  BP 110/78 (BP Location: Left Arm, Patient Position: Sitting, Cuff Size: Normal)   Pulse 72   Temp 97.9 F (36.6 C) (Oral)   Resp 18   Ht 6' 1.5" (1.867 m)   Wt 260 lb 9.6 oz (118.2 kg)   SpO2 98%   BMI 33.92 kg/m   Wt Readings from  Last 3 Encounters:  06/14/17 260 lb 9.6 oz (118.2 kg)  02/10/17 266 lb (120.7 kg)  01/27/17 266 lb (120.7 kg)     Physical Exam  Constitutional: He is oriented to person, place, and time. He appears well-developed. He is active.  Non-toxic appearance. He does not appear ill.  Eyes: Pupils are equal, round, and reactive to light. Conjunctivae and EOM are normal.  Cardiovascular: Normal rate, regular rhythm, S1 normal, S2 normal, normal heart sounds, intact distal pulses and normal pulses. Exam reveals no gallop and no friction rub.  No murmur heard. Pulmonary/Chest: Effort normal. He has no rales.  Abdominal: He exhibits no distension.  Musculoskeletal: Normal range of motion. He exhibits no edema.  Neurological: He is alert and oriented to person, place, and time. He has normal strength and normal reflexes. He is not disoriented. No cranial nerve deficit or sensory deficit. He exhibits normal muscle tone. Coordination and gait normal.  Skin: Skin is warm and dry. He is not diaphoretic. No pallor.  Psychiatric: He has a normal mood and affect. His behavior is normal.  Nursing note and vitals reviewed.   Lab Results  Component Value Date   WBC 5.4 10/19/2016   HGB 16.6 10/19/2016   HCT 47.3 10/19/2016   MCV 94 10/19/2016   PLT 173 10/19/2016    Lab Results  Component Value Date   CREATININE 0.94 01/25/2017   BUN 10 01/25/2017   NA 137 01/25/2017   K  4.1 01/25/2017   CL 99 01/25/2017   CO2 22 01/25/2017    Lab Results  Component Value Date   ALT 44 01/25/2017   AST 35 01/25/2017   ALKPHOS 61 01/25/2017   BILITOT 0.8 01/25/2017    Lab Results  Component Value Date   TSH 1.717 10/18/2006    Lab Results  Component Value Date   HGBA1C 6.5 (H) 01/25/2017    Lab Results  Component Value Date   CHOL 133 10/04/2015   HDL 25 (L) 10/04/2015   LDLCALC 78 10/04/2015   TRIG 151 (H) 10/04/2015   CHOLHDL 5.3 (H) 10/04/2015     No results found for this or any  previous visit (from the past 72 hour(s)).  No results found.  ASSESSMENT AND PLAN:  Willem was seen today for urology and follow-up.  Diagnoses and all orders for this visit:  Solitary pulmonary nodule: Chest radiography is reassuring.  Per the recommendations stated in his previous CT scan I think it is reasonable to recheck in about a year.  I have ordered a CT scan as future. -     DG Chest 2 View; Future    The patient is advised to call or return to clinic if he does not see an improvement in symptoms, or to seek the care of the closest emergency department if he worsens with the above plan.   Philis Fendt, MHS, PA-C [facility] 06/14/2017 8:37 AM

## 2017-06-14 NOTE — Patient Instructions (Signed)
     IF you received an x-ray today, you will receive an invoice from [facility]. Please contact [facility] at [phone] with questions or concerns regarding your invoice.   IF you received labwork today, you will receive an invoice from [facility]. Please contact [facility] at [phone] with questions or concerns regarding your invoice.   Our billing staff will not be able to assist you with questions regarding bills from these companies.  You will be contacted with the lab results as soon as they are available. The fastest way to get your results is to activate your My Chart account. Instructions are located on the last page of this paperwork. If you have not heard from us regarding the results in 2 weeks, please contact this office.     

## 2017-09-08 ENCOUNTER — Encounter: Payer: Self-pay | Admitting: Physician Assistant

## 2017-09-08 ENCOUNTER — Ambulatory Visit: Payer: BLUE CROSS/BLUE SHIELD | Admitting: Physician Assistant

## 2017-09-08 VITALS — BP 119/75 | HR 86 | Temp 98.3°F | Resp 17 | Ht 74.5 in | Wt 262.0 lb

## 2017-09-08 DIAGNOSIS — M25512 Pain in left shoulder: Secondary | ICD-10-CM | POA: Diagnosis not present

## 2017-09-08 DIAGNOSIS — R079 Chest pain, unspecified: Secondary | ICD-10-CM

## 2017-09-08 MED ORDER — TRAMADOL HCL 50 MG PO TABS: 25.0000 mg | ORAL_TABLET | Freq: Three times a day (TID) | ORAL | 0 refills | Status: DC | PRN

## 2017-09-08 MED ORDER — CYCLOBENZAPRINE HCL 10 MG PO TABS: 5.0000 mg | ORAL_TABLET | Freq: Three times a day (TID) | ORAL | 0 refills | Status: DC | PRN

## 2017-09-08 NOTE — Patient Instructions (Signed)
     IF you received an x-ray today, you will receive an invoice from [facility]. Please contact [facility] at [phone] with questions or concerns regarding your invoice.   IF you received labwork today, you will receive an invoice from [facility]. Please contact [facility] at [phone] with questions or concerns regarding your invoice.   Our billing staff will not be able to assist you with questions regarding bills from these companies.  You will be contacted with the lab results as soon as they are available. The fastest way to get your results is to activate your My Chart account. Instructions are located on the last page of this paperwork. If you have not heard from us regarding the results in 2 weeks, please contact this office.     

## 2017-09-08 NOTE — Progress Notes (Signed)
09/08/2017 4:05 PM   DOB: 1965-09-18 / MRN: [id number]  SUBJECTIVE:  Casey [name] is a 52 y.o. male presenting for left shoulder pain that started about 2 months ago and has waxed and waned. He has tried ibuprofen but not consistently. Tells me that the pain is now in his left chest.  Denies chest pain, SOB, DOE, leg swelling, cough, dizziness, diaphoresis.   The 10-year ASCVD risk score Casey [name] DC Casey [name]., et al., 2013) is: 5%   Values used to calculate the score:     Age: 3 years     Sex: Male     Is Non-Hispanic African American: No     Diabetic: No     Tobacco smoker: No     Systolic Blood Pressure: 782 mmHg     Is BP treated: Yes     HDL Cholesterol: 25 mg/dL     Total Cholesterol: 133 mg/dL   He is allergic to fish allergy and penicillins.   He  has a past medical history of Hyperglycemia (09/07/2015) and SINUSITIS, ACUTE (03/08/2008).    He  reports that he quit smoking about 21 months ago. He has never used smokeless tobacco. He reports that he does not drink alcohol or use drugs. He  has no sexual activity history on file. The patient  has a past surgical history that includes Appendectomy (2005) and Wisdom tooth extraction.  His family history includes Hypertension in his father; Stroke in his mother.  Review of Systems  Constitutional: Negative for fever.  Respiratory: Negative for cough, hemoptysis, sputum production, shortness of breath and wheezing.   Cardiovascular: Positive for chest pain. Negative for palpitations, orthopnea, claudication, leg swelling and PND.  Musculoskeletal: Positive for joint pain, myalgias and neck pain. Negative for back pain and falls.  Skin: Negative for rash.  Neurological: Negative for dizziness.    The problem list and medications were reviewed and updated by myself where necessary and exist elsewhere in the encounter.   OBJECTIVE:  BP 119/75   Pulse 86   Temp 98.3 F (36.8 C) (Oral)   Resp 17   Ht 6' 2.5" (1.892 m)   Wt 262 lb  (118.8 kg)   SpO2 98%   BMI 33.19 kg/m   Wt Readings from Last 3 Encounters:  09/08/17 262 lb (118.8 kg)  06/14/17 260 lb 9.6 oz (118.2 kg)  02/10/17 266 lb (120.7 kg)   Temp Readings from Last 3 Encounters:  09/08/17 98.3 F (36.8 C) (Oral)  06/14/17 97.9 F (36.6 C) (Oral)  02/10/17 98 F (36.7 C)   BP Readings from Last 3 Encounters:  09/08/17 119/75  06/14/17 110/78  02/10/17 102/72   Pulse Readings from Last 3 Encounters:  09/08/17 86  06/14/17 72  02/10/17 73    Physical Exam  Constitutional: He is oriented to person, place, and time. He appears well-developed. He does not appear ill.  Eyes: Pupils are equal, round, and reactive to light. Conjunctivae and EOM are normal.  Cardiovascular: Normal rate.  Pulmonary/Chest: Effort normal.  Abdominal: He exhibits no distension.  Musculoskeletal: Normal range of motion.       Left shoulder: He exhibits normal range of motion, no tenderness, no bony tenderness, no swelling, no effusion, no crepitus, no deformity, no laceration, no pain, no spasm, normal pulse and normal strength.       Cervical back: He exhibits normal range of motion, no tenderness, no bony tenderness, no swelling, no edema, no deformity, no laceration, no pain  and no spasm.       Arms: Neurological: He is alert and oriented to person, place, and time. No cranial nerve deficit. Coordination normal.  Skin: Skin is warm and dry. He is not diaphoretic.  Psychiatric: He has a normal mood and affect.  Nursing note and vitals reviewed.   Lab Results  Component Value Date   HGBA1C 6.5 (H) 01/25/2017    Lab Results  Component Value Date   WBC 5.4 10/19/2016   HGB 16.6 10/19/2016   HCT 47.3 10/19/2016   MCV 94 10/19/2016   PLT 173 10/19/2016    Lab Results  Component Value Date   CREATININE 0.94 01/25/2017   BUN 10 01/25/2017   NA 137 01/25/2017   K 4.1 01/25/2017   CL 99 01/25/2017   CO2 22 01/25/2017    Lab Results  Component Value Date    ALT 44 01/25/2017   AST 35 01/25/2017   ALKPHOS 61 01/25/2017   BILITOT 0.8 01/25/2017    Lab Results  Component Value Date   TSH 1.717 10/18/2006    Lab Results  Component Value Date   CHOL 133 10/04/2015   HDL 25 (L) 10/04/2015   LDLCALC 78 10/04/2015   TRIG 151 (H) 10/04/2015   CHOLHDL 5.3 (H) 10/04/2015     ASSESSMENT AND PLAN:  Casey [name] was seen today for shoulder pain.  Diagnoses and all orders for this visit:  Left shoulder pain, unspecified chronicity: Right bundle [name] to be evaluated further.  Patient without SOB or ischemia at this time.  Will refer to CARDS.  Patient will take tramadol and flexeril for cardiac friendly pain regimen.  -     EKG 12-Lead -     Ambulatory referral to Cardiology -     traMADol (ULTRAM) 50 MG tablet; Take 0.5-1 tablets (25-50 mg total) by mouth every 8 (eight) hours as needed. -     cyclobenzaprine (FLEXERIL) 10 MG tablet; Take 0.5-1 tablets (5-10 mg total) by mouth 3 (three) times daily as needed.    The patient is advised to call or return to clinic if he does not see an improvement in symptoms, or to seek the care of the closest emergency department if he worsens with the above plan.   Casey [name], MHS, PA-C [facility] 09/08/2017 4:05 PM

## 2017-09-13 ENCOUNTER — Ambulatory Visit: Payer: BLUE CROSS/BLUE SHIELD | Admitting: Physician Assistant

## 2017-09-24 ENCOUNTER — Ambulatory Visit: Payer: BLUE CROSS/BLUE SHIELD | Admitting: Physician Assistant

## 2017-09-30 DIAGNOSIS — M25519 Pain in unspecified shoulder: Secondary | ICD-10-CM | POA: Diagnosis not present

## 2017-09-30 DIAGNOSIS — R0789 Other chest pain: Secondary | ICD-10-CM | POA: Diagnosis not present

## 2017-09-30 DIAGNOSIS — R0609 Other forms of dyspnea: Secondary | ICD-10-CM | POA: Diagnosis not present

## 2017-09-30 DIAGNOSIS — I10 Essential (primary) hypertension: Secondary | ICD-10-CM | POA: Diagnosis not present

## 2017-11-05 DIAGNOSIS — I10 Essential (primary) hypertension: Secondary | ICD-10-CM | POA: Diagnosis not present

## 2017-11-05 DIAGNOSIS — M25519 Pain in unspecified shoulder: Secondary | ICD-10-CM | POA: Diagnosis not present

## 2017-11-05 DIAGNOSIS — R9431 Abnormal electrocardiogram [ECG] [EKG]: Secondary | ICD-10-CM | POA: Diagnosis not present

## 2017-11-05 DIAGNOSIS — R0789 Other chest pain: Secondary | ICD-10-CM | POA: Diagnosis not present

## 2017-11-08 ENCOUNTER — Telehealth: Payer: Self-pay | Admitting: Physician Assistant

## 2017-11-08 DIAGNOSIS — I10 Essential (primary) hypertension: Secondary | ICD-10-CM

## 2017-11-09 NOTE — Telephone Encounter (Signed)
Pt last office visit was 09/08/2017 with Philis Fendt

## 2017-11-09 NOTE — Telephone Encounter (Signed)
Attempted to contact pt regarding refill request; pt's last office visit 01/25/17; no upcoming visits noted; when pt calls back please schedule an office visit; let message on [phone].

## 2017-12-08 DIAGNOSIS — N200 Calculus of kidney: Secondary | ICD-10-CM | POA: Diagnosis not present

## 2017-12-08 DIAGNOSIS — R311 Benign essential microscopic hematuria: Secondary | ICD-10-CM | POA: Diagnosis not present

## 2017-12-21 ENCOUNTER — Other Ambulatory Visit: Payer: Self-pay | Admitting: Urology

## 2017-12-23 ENCOUNTER — Other Ambulatory Visit: Payer: Self-pay | Admitting: Urology

## 2017-12-23 ENCOUNTER — Ambulatory Visit: Payer: BLUE CROSS/BLUE SHIELD | Admitting: Physician Assistant

## 2017-12-23 ENCOUNTER — Encounter: Payer: Self-pay | Admitting: Physician Assistant

## 2017-12-23 VITALS — BP 111/74 | HR 76 | Temp 98.4°F | Resp 16 | Ht 73.0 in | Wt 253.2 lb

## 2017-12-23 DIAGNOSIS — R7303 Prediabetes: Secondary | ICD-10-CM

## 2017-12-23 DIAGNOSIS — R0683 Snoring: Secondary | ICD-10-CM | POA: Diagnosis not present

## 2017-12-23 DIAGNOSIS — I10 Essential (primary) hypertension: Secondary | ICD-10-CM

## 2017-12-23 LAB — POCT URINALYSIS DIP (MANUAL ENTRY)
BILIRUBIN UA: NEGATIVE
BILIRUBIN UA: NEGATIVE mg/dL
Glucose, UA: NEGATIVE mg/dL
Leukocytes, UA: NEGATIVE
NITRITE UA: NEGATIVE
PH UA: 5.5 (ref 5.0–8.0)
Protein Ur, POC: NEGATIVE mg/dL
SPEC GRAV UA: 1.02 (ref 1.010–1.025)
Urobilinogen, UA: 0.2 E.U./dL

## 2017-12-23 MED ORDER — LISINOPRIL 20 MG PO TABS: 20.0000 mg | ORAL_TABLET | Freq: Every day | ORAL | 3 refills | Status: DC

## 2017-12-23 MED ORDER — LISINOPRIL-HYDROCHLOROTHIAZIDE 20-12.5 MG PO TABS: 1.0000 | ORAL_TABLET | Freq: Every day | ORAL | 3 refills | Status: DC

## 2017-12-23 MED ORDER — HYDROCHLOROTHIAZIDE 12.5 MG PO TABS: 12.5000 mg | ORAL_TABLET | Freq: Every day | ORAL | 3 refills | Status: DC

## 2017-12-23 NOTE — Patient Instructions (Addendum)
In terms of HTN, you can start using lisinopril 65m daily by itself and see if this controls your blood pressure (<130/80). If you need to add back the HCTZ, you can. As long as bp is stable <130/80, you can follow up in one year, otherwise, come back sooner.   I have placed referral for sleep study and they should contact you within 2 weeks.  I also suggest using daily flonase for congestion.    If you have lab work done today you will be contacted with your lab results within the next 2 weeks.  If you have not heard from [place] please contact [place] The fastest way to get your results is to register for My Chart.   IF you received an x-ray today, [facility]. Please contact [facility] at [phone] questions or concerns regarding your invoice.   IF you received labwork today, you will receive an invoice from [facility] Please contact [facility] at [phone] questions or concerns regarding your invoice.   Our billing staff will not be able to assist you with questions regarding bills from these companies.  You will be contacted with the lab results as soon as they are available. The fastest way to get your results is to activate your My Chart account. Instructions are located on the last page of this paperwork. If you have not heard from [place] the results in 2 weeks, please contact this office.

## 2017-12-23 NOTE — Progress Notes (Signed)
MRN: [id number] DOB: 1966-01-07  Subjective:   Casey [name] is a 52 y.o. male presenting for follow up on HTN and to discuss apneic episodes.   HTN: Currently managed with lisinopril-hctz 20-12.684m daily . Patient is checking blood pressure at home, notes it is always normal. Denies lightheadedness, dizziness, chronic headache, double vision, chest pain, shortness of breath, heart racing, palpitations, nausea, vomiting, abdominal pain, hematuria, lower leg swelling.  -Witnessed reported apneic episodes over the past year. Worse when he is on his back. Has associated snoring, daytime somonlence, headaches first thing in the morning (very mild).Has PMH of seasonal allergies, not on anything daily. Does feel congested, worse at night, and has sneezing.Former smoker, quit 2017.Hardly ever drinks alcohol.   Diet: Drinks water mostly, occasional soda. "I eat everything" Exercise: No structured exercise.  Has kidney stone, getting lithotripsy in 01/2018.  JElvinhas a current medication list which includes the following prescription(s): lisinopril-hydrochlorothiazide, ascorbic acid, b complex vitamins, betamethasone dipropionate, cyclobenzaprine, olopatadine hcl, tramadol, and triamcinolone cream, and the following Facility-Administered Medications: sodium chloride. Also is allergic to fish allergy and penicillins.  JNikos has a past medical history of Hyperglycemia (09/07/2015) and SINUSITIS, ACUTE (03/08/2008). Also  has a past surgical history that includes Appendectomy (2005) and Wisdom tooth extraction.   Objective:   Vitals: BP 111/74 (BP Location: Right Arm, Patient Position: Sitting, Cuff Size: Normal)   Pulse 76   Temp 98.4 F (36.9 C) (Oral)   Resp 16   Ht 6' 1"  (1.854 m)   Wt 253 lb 3.2 oz (114.9 kg)   SpO2 98%   BMI 33.41 kg/m   Physical Exam  Constitutional: He is oriented to person, place, and time. He appears well-developed and well-nourished. No distress.  HENT:  Head:  Normocephalic and atraumatic.  Mouth/Throat: Uvula is midline, oropharynx is clear and moist and mucous membranes are normal. No tonsillar exudate.  Eyes: Pupils are equal, round, and reactive to light. Conjunctivae and EOM are normal.  Neck: Normal range of motion.  Neck circumference measures 19 inches  Cardiovascular: Normal rate, regular rhythm, normal heart sounds and intact distal pulses.  Pulmonary/Chest: Effort normal and breath sounds normal. He has no decreased breath sounds. He has no wheezes. He has no rhonchi. He has no rales.  Musculoskeletal:       Right lower leg: He exhibits no swelling.       Left lower leg: He exhibits no swelling.  Neurological: He is alert and oriented to person, place, and time.  Skin: Skin is warm and dry.  Psychiatric: He has a normal mood and affect.  Vitals reviewed.   No results found for this or any previous visit (from the past 24 hour(s)).  BP Readings from Last 3 Encounters:  12/23/17 111/74  09/08/17 119/75  06/14/17 110/78    Assessment and Plan :  1. Essential hypertension Well controlled. May not need 2 agents for bp control. Rec trying lisinopril 2784mdaily by itself for the next two weeks, if bp remains <130/80, can continue this solely. If >130/80, can add back HCTZ 12.84m53maily. Follow up in 6 months.  - CBC with Differential/Platelet - CMP14+EGFR - Lipid panel - TSH - POCT urinalysis dipstick - lisinopril (PRINIVIL,ZESTRIL) 20 MG tablet; Take 1 tablet (20 mg total) by mouth daily.  Dispense: 90 tablet; Refill: 3 - hydrochlorothiazide (HYDRODIURIL) 12.5 MG tablet; Take 1 tablet (12.5 mg total) by mouth daily.  Dispense: 90 tablet; Refill: 3  2. Prediabetes -  Lipid panel - Hemoglobin A1c  3. Snoring Hx concerning for OSA. Referral to neuro for sleep study.  - Ambulatory referral to Neurology  Tenna Delaine, PA-C  [facility] 12/23/2017 3:46 PM

## 2017-12-24 LAB — CBC WITH DIFFERENTIAL/PLATELET
BASOS ABS: 0.1 10*3/uL (ref 0.0–0.2)
Basos: 1 %
EOS (ABSOLUTE): 0.1 10*3/uL (ref 0.0–0.4)
Eos: 1 %
Hematocrit: 45 % (ref 37.5–51.0)
Hemoglobin: 15.9 g/dL (ref 13.0–17.7)
Immature Grans (Abs): 0 10*3/uL (ref 0.0–0.1)
Immature Granulocytes: 0 %
LYMPHS ABS: 2.1 10*3/uL (ref 0.7–3.1)
Lymphs: 38 %
MCH: 32.3 pg (ref 26.6–33.0)
MCHC: 35.3 g/dL (ref 31.5–35.7)
MCV: 92 fL (ref 79–97)
MONOCYTES: 8 %
MONOS ABS: 0.5 10*3/uL (ref 0.1–0.9)
Neutrophils Absolute: 2.8 10*3/uL (ref 1.4–7.0)
Neutrophils: 52 %
PLATELETS: 289 10*3/uL (ref 150–450)
RBC: 4.92 x10E6/uL (ref 4.14–5.80)
RDW: 12.3 % (ref 12.3–15.4)
WBC: 5.5 10*3/uL (ref 3.4–10.8)

## 2017-12-24 LAB — CMP14+EGFR
ALK PHOS: 63 IU/L (ref 39–117)
ALT: 58 IU/L — ABNORMAL HIGH (ref 0–44)
AST: 58 IU/L — AB (ref 0–40)
Albumin/Globulin Ratio: 1.6 (ref 1.2–2.2)
Albumin: 4.4 g/dL (ref 3.5–5.5)
BUN/Creatinine Ratio: 16 (ref 9–20)
BUN: 13 mg/dL (ref 6–24)
Bilirubin Total: 1.3 mg/dL — ABNORMAL HIGH (ref 0.0–1.2)
CHLORIDE: 102 mmol/L (ref 96–106)
CO2: 20 mmol/L (ref 20–29)
CREATININE: 0.81 mg/dL (ref 0.76–1.27)
Calcium: 9.6 mg/dL (ref 8.7–10.2)
GFR calc Af Amer: 118 mL/min/{1.73_m2} (ref >59)
GFR calc non Af Amer: 102 mL/min/{1.73_m2} (ref >59)
GLUCOSE: 98 mg/dL (ref 65–99)
Globulin, Total: 2.8 g/dL (ref 1.5–4.5)
Potassium: 4.1 mmol/L (ref 3.5–5.2)
SODIUM: 137 mmol/L (ref 134–144)
Total Protein: 7.2 g/dL (ref 6.0–8.5)

## 2017-12-24 LAB — TSH: TSH: 1.88 u[IU]/mL (ref 0.450–4.500)

## 2017-12-24 LAB — LIPID PANEL
CHOLESTEROL TOTAL: 132 mg/dL (ref 100–199)
Chol/HDL Ratio: 4.9 ratio (ref 0.0–5.0)
HDL: 27 mg/dL — ABNORMAL LOW (ref >39)
LDL CALC: 70 mg/dL (ref 0–99)
Triglycerides: 173 mg/dL — ABNORMAL HIGH (ref 0–149)
VLDL CHOLESTEROL CAL: 35 mg/dL (ref 5–40)

## 2017-12-24 LAB — HEMOGLOBIN A1C
ESTIMATED AVERAGE GLUCOSE: 117 mg/dL
HEMOGLOBIN A1C: 5.7 % — AB (ref 4.8–5.6)

## 2017-12-26 ENCOUNTER — Encounter: Payer: Self-pay | Admitting: Physician Assistant

## 2018-01-25 ENCOUNTER — Encounter: Payer: Self-pay | Admitting: Neurology

## 2018-01-25 ENCOUNTER — Ambulatory Visit (INDEPENDENT_AMBULATORY_CARE_PROVIDER_SITE_OTHER): Payer: BLUE CROSS/BLUE SHIELD | Admitting: Neurology

## 2018-01-25 VITALS — BP 172/89 | HR 73 | Ht 74.0 in | Wt 251.0 lb

## 2018-01-25 DIAGNOSIS — R0681 Apnea, not elsewhere classified: Secondary | ICD-10-CM

## 2018-01-25 DIAGNOSIS — G4719 Other hypersomnia: Secondary | ICD-10-CM | POA: Diagnosis not present

## 2018-01-25 DIAGNOSIS — R0683 Snoring: Secondary | ICD-10-CM | POA: Diagnosis not present

## 2018-01-25 DIAGNOSIS — R0689 Other abnormalities of breathing: Secondary | ICD-10-CM | POA: Diagnosis not present

## 2018-01-25 DIAGNOSIS — R351 Nocturia: Secondary | ICD-10-CM

## 2018-01-25 DIAGNOSIS — E669 Obesity, unspecified: Secondary | ICD-10-CM | POA: Diagnosis not present

## 2018-01-25 DIAGNOSIS — Z9189 Other specified personal risk factors, not elsewhere classified: Secondary | ICD-10-CM

## 2018-01-25 DIAGNOSIS — R51 Headache: Secondary | ICD-10-CM

## 2018-01-25 DIAGNOSIS — I4510 Unspecified right bundle-branch block: Secondary | ICD-10-CM

## 2018-01-25 DIAGNOSIS — R519 Headache, unspecified: Secondary | ICD-10-CM

## 2018-01-25 NOTE — Progress Notes (Signed)
Subjective:    Patient ID: Casey [name] is a 52 y.o. male.  HPI    Star Age, MD, PhD [facility] [address], [address] [place], [place] [address]  Dear Casey [name],  I saw your patient, Casey [name], upon your kind request in the sleep clinic today for initial consultation of his sleep disorder, in particular, concern for underlying obstructive sleep apnea. The patient is unaccompanied today. As you know, Mr. Casey [name] is a 52 year old right-handed gentleman with an underlying medical history of hypertension, eczema, prediabetes, prior smoking, kidney stones, and obesity, who reports snoring and excessive daytime somnolence as well as witnessed apneas. I reviewed your office note from 12/23/2017. His Epworth sleepiness score is 15 out of 24 today, fatigue score is 46 out of 63. He has woken up with a sense of gasping for air. He is single and lives alone, he quit smoking in July 2017, drinks alcohol rarely, drinks caffeine daily in the form of coffee and sodas, about 3-4 servings per day on average. His bedtime is around 11:30 PM and rise time around 7. He has nocturia, typically about once per average night, he has had morning headaches and has been told that he pauses in his snoring while asleep. He has a history of left shoulder discomfort, he had an EKG recently which showed right bundle branch block and saw Dr. Einar [name] in cardiology, had further workup which was benign per patient report. He is scheduled for lithotripsy in 2 days. He has no pets, no children, has a TV in the bedroom and puts it on a timer typically. He works as a Government social research officer. He is not aware of any family history of OSA. He is familiar with the diagnosis as his stepfather has sleep apnea and uses a CPAP machine. Patient would be willing to consider CPAP therapy. His weight has been fluctuating.  His Past Medical History Is Significant For: Past Medical History:  Diagnosis Date  .  Hyperglycemia 09/07/2015  . SINUSITIS, ACUTE 03/08/2008   Qualifier: Diagnosis of  By: Amil Amen MD, Benjamine Mola      His Past Surgical History Is Significant For: Past Surgical History:  Procedure Laterality Date  . APPENDECTOMY  2005  . WISDOM TOOTH EXTRACTION      His Family History Is Significant For: Family History  Problem Relation Age of Onset  . Stroke Mother   . Hypertension Father   . Colon cancer Neg Hx     His Social History Is Significant For: Social History   Socioeconomic History  . Marital status: Single    Spouse name: Not on file  . Number of children: Not on file  . Years of education: Not on file  . Highest education level: Not on file  Occupational History  . Not on file  Social Needs  . Financial resource strain: Not on file  . Food insecurity:    Worry: Not on file    Inability: Not on file  . Transportation needs:    Medical: Not on file    Non-medical: Not on file  Tobacco Use  . Smoking status: Former Smoker    Last attempt to quit: 11/24/2015    Years since quitting: 2.1  . Smokeless tobacco: Never Used  Substance and Sexual Activity  . Alcohol use: No    Alcohol/week: 0.0 standard drinks    Frequency: Never    Comment: rare   . Drug use: No  . Sexual activity: Not on file  Lifestyle  . Physical activity:    Days per week: Not on file    Minutes per session: Not on file  . Stress: Not on file  Relationships  . Social connections:    Talks on phone: Not on file    Gets together: Not on file    Attends religious service: Not on file    Active member of club or organization: Not on file    Attends meetings of clubs or organizations: Not on file    Relationship status: Not on file  Other Topics Concern  . Not on file  Social History Narrative  . Not on file    His Allergies Are:  Allergies  Allergen Reactions  . Fish Allergy Rash  . Penicillins Rash    REACTION: had reaction as an infant--cannot recall reaction  :   His  Current Medications Are:  Outpatient Encounter Medications as of 01/25/2018  Medication Sig  . fluticasone (FLONASE) 50 MCG/ACT nasal spray Place 2 sprays into both nostrils daily.  Marland Kitchen lisinopril (PRINIVIL,ZESTRIL) 20 MG tablet Take 1 tablet (20 mg total) by mouth daily.  . Olopatadine HCl (PAZEO) 0.7 % SOLN Apply to eye daily.  Marland Kitchen triamcinolone cream (KENALOG) 0.1 % Apply 1 application topically 2 (two) times daily.  . [DISCONTINUED] hydrochlorothiazide (HYDRODIURIL) 12.5 MG tablet Take 1 tablet (12.5 mg total) by mouth daily.   Facility-Administered Encounter Medications as of 01/25/2018  Medication  . 0.9 %  sodium chloride infusion  :  Review of Systems:  Out of a complete 14 point review of systems, all are reviewed and negative with the exception of these symptoms as listed below: Review of Systems  Neurological:       Pt presents today to discuss his sleep. Pt has never had a sleep study but does endorse snoring.  Epworth Sleepiness Scale 0= would never doze 1= slight chance of dozing 2= moderate chance of dozing 3= high chance of dozing  Sitting and reading: 2 Watching TV: 2 Sitting inactive in a public place (ex. Theater or meeting): 2 As a passenger in a car for an hour without a break: 3 Lying down to rest in the afternoon: 3 Sitting and talking to someone: 0 Sitting quietly after lunch (no alcohol): 2 In a car, while stopped in traffic: 1 Total: 15     Objective:  Neurological Exam  Physical Exam  Physical Examination:   Vitals:   01/25/18 0841  BP: (!) 172/89  Pulse: 73    General Examination: The patient is a very pleasant 52 y.o. male in no acute distress. He appears well-developed and well-nourished and well groomed.   HEENT: Normocephalic, atraumatic, pupils are equal, [name] and reactive to light and accommodation. Extraocular tracking is good without limitation to gaze excursion or nystagmus noted. Normal smooth pursuit is noted. Hearing is grossly  intact. Face is symmetric with normal facial animation and normal facial sensation. Speech is clear with no dysarthria noted. There is no hypophonia. There is no lip, neck/head, jaw or voice tremor. Neck is supple with full range of passive and active motion. There are no carotid bruits on auscultation. Oropharynx exam reveals: mild mouth dryness, adequate dental hygiene with partial plate on top, and moderate to marked airway crowding secondary to smaller airway entry, larger uvula, tonsils in place, about 2+ in size bilaterally. Neck circumference is 19-1/2 inches. He has a minimal or absent overbite. Tongue protrudes centrally and palate elevates symmetrically.  Chest: Clear to auscultation without wheezing,  rhonchi or crackles noted.  Heart: S1+S2+0, regular and normal without murmurs, rubs or gallops noted.   Abdomen: Soft, non-tender and non-distended with normal bowel sounds appreciated on auscultation.  Extremities: There is no pitting edema in the distal lower extremities bilaterally.   Skin: Warm and dry with some eczematous changes noted in the distal legs.  Musculoskeletal: exam reveals no obvious joint deformities, tenderness or joint swelling or erythema.   Neurologically:  Mental status: The patient is awake, alert and oriented in all 4 spheres. His immediate and remote memory, attention, language skills and fund of knowledge are appropriate. There is no evidence of aphasia, agnosia, apraxia or anomia. Speech is clear with normal prosody and enunciation. Thought process is linear. Mood is normal and affect is normal.  Cranial nerves II - XII are as described above under HEENT exam. In addition: shoulder shrug is normal with equal shoulder height noted. Motor exam: Normal bulk, strength and tone is noted. There is no drift, tremor or rebound. Romberg is negative. Fine motor skills and coordination: grossly intact.  Cerebellar testing: No dysmetria or intention tremor.  Sensory exam:  intact to light touch in the upper and lower extremities.  Gait, station and balance: He stands easily. No veering to one side is noted. No leaning to one side is noted. Posture is age-appropriate and stance is narrow based. Gait shows normal stride length and normal pace. No problems turning are noted. Tandem walk is unremarkable.   Assessment and Plan:  In summary, Leeandre Nordling is a very pleasant 52 y.o.-year old male with an underlying medical history of hypertension, eczema, prediabetes, prior smoking, kidney stones, and obesity, whose history and physical exam are concerning for obstructive sleep apnea (OSA). I had a long chat with the patient  about my findings and the diagnosis of OSA, its prognosis and treatment options. We talked about medical treatments, surgical interventions and non-pharmacological approaches. I explained in particular the risks and ramifications of untreated moderate to severe OSA, especially with respect to developing cardiovascular disease down the Road, including congestive heart failure, difficult to treat hypertension, cardiac arrhythmias, or stroke. Even type 2 diabetes has, in part, been linked to untreated OSA. Symptoms of untreated OSA include daytime sleepiness, memory problems, mood irritability and mood disorder such as depression and anxiety, lack of energy, as well as recurrent headaches, especially morning headaches. We talked about trying to maintain a healthy lifestyle in general, as well as the importance of weight control. I encouraged the patient to eat healthy, exercise daily and keep well hydrated, to keep a scheduled bedtime and wake time routine, to not skip any meals and eat healthy snacks in between meals. I advised the patient not to drive when feeling sleepy. I recommended the following at this time: sleep study with potential positive airway pressure titration. (We will score hypopneas at 3%).   I explained the sleep test procedure to the patient and  also outlined possible surgical and non-surgical treatment options of OSA, including the use of a custom-made dental device (which would require a referral to a specialist dentist or oral surgeon), upper airway surgical options, such as pillar implants, radiofrequency surgery, tongue base surgery, and UPPP (which would involve a referral to an ENT surgeon). Rarely, jaw surgery such as mandibular advancement may be considered.  I also explained the CPAP treatment option to the patient, who indicated that he would be willing to try CPAP if the need arises. I explained the importance of being compliant with  PAP treatment, not only for insurance purposes but primarily to improve His symptoms, and for the patient's long term health benefit, including to reduce His cardiovascular risks. I answered all his questions today and the patient was in agreement. I plan to see him back after the sleep study is completed and encouraged him to call with any interim questions, concerns, problems or updates.   Thank you very much for allowing me to participate in the care of this nice patient. If I can be of any further assistance to you please do not hesitate to call me at [phone].  Sincerely,   Star Age, MD, PhD

## 2018-01-25 NOTE — Patient Instructions (Signed)

## 2018-01-26 NOTE — H&P (Signed)
Cc: Renal calculus  HPI:  12/08/2017  Previous patient of Dr. Pilar Jarvis. Patient underwent hematuria workup which was negative except for bilateral nonobstructing renal calculi. He opted for surveillance of these. He returns after undergoing a KUB that essentially showed no change in the size of the calculi. He has had no pain, gross hematuria, urinary tract infections. No complaints today.     ALLERGIES: Penicillins - Skin Rash    MEDICATIONS: Lisinopril-Hydrochlorothiazide 20 mg-12.5 mg tablet     GU PSH: Cystoscopy - 03/30/2017 Locm 300-399Mg/Ml Iodine,1Ml - 03/26/2017    NON-GU PSH: Appendectomy - 2005    GU PMH: Microscopic hematuria - 03/30/2017, - 03/10/2017      PMH Notes: (R) Bundle Branch   NON-GU PMH: Anxiety Hypertension    FAMILY HISTORY: High Blood Pressure - Father   SOCIAL HISTORY: Marital Status: Single Current Smoking Status: Patient smokes. Has smoked since 02/23/2014. Smokes 2 packs per day.   Tobacco Use Assessment Completed: Used Tobacco in last 30 days? Has never drank.  Drinks 3 caffeinated drinks per day. Patient's occupation Haematologist.    REVIEW OF SYSTEMS:    GU Review Male:   Patient denies frequent urination, hard to postpone urination, burning/ pain with urination, get up at night to urinate, leakage of urine, stream starts and stops, trouble starting your stream, have to strain to urinate , erection problems, and penile pain.  Gastrointestinal (Upper):   Patient reports nausea. Patient denies vomiting and indigestion/ heartburn.  Gastrointestinal (Lower):   Patient denies diarrhea and constipation.  Constitutional:   Patient denies fever, night sweats, weight loss, and fatigue.  Skin:   Patient denies skin rash/ lesion and itching.  Eyes:   Patient denies blurred vision and double vision.  Ears/ Nose/ Throat:   Patient denies sinus problems and sore throat.  Hematologic/Lymphatic:   Patient denies swollen glands and easy bruising.   Cardiovascular:   Patient denies leg swelling and chest pains.  Respiratory:   Patient denies cough and shortness of breath.  Endocrine:   Patient denies excessive thirst.  Musculoskeletal:   Patient denies back pain and joint pain.  Neurological:   Patient reports headaches. Patient denies dizziness.  Psychologic:   Patient denies depression and anxiety.   Notes: "fells weird"     VITAL SIGNS:      12/08/2017 02:34 PM  Weight 260 lb / 117.93 kg  BP 109/75 mmHg  Heart Rate 98 /min   MULTI-SYSTEM PHYSICAL EXAMINATION:    Constitutional: Well-nourished. No physical deformities. Normally developed. Good grooming.  Respiratory: No labored breathing, no use of accessory muscles.   Cardiovascular: Normal temperature, adequate perfusion of extremities  Skin: No paleness, no jaundice  Neurologic / Psychiatric: Oriented to time, oriented to place, oriented to person. No depression, no anxiety, no agitation.  Gastrointestinal: No mass, no tenderness, no rigidity, non obese abdomen.  Eyes: Normal conjunctivae. Normal eyelids.  Musculoskeletal: Normal gait and station of head and neck.     PAST DATA REVIEWED:  Source Of History:  Patient  Records Review:   Previous Patient Records  X-Ray Review: C.T. Abdomen/Pelvis: Reviewed Films. Reviewed Report. Discussed With Patient. I reviewed the prior CT scan from 03/26/2017.    PROCEDURES:         KUB - [id number]  A single view of the abdomen is obtained.  No interval change in the left nonobstructing renal calculi. I do not obviously see the right renal calculus.  Urinalysis w/Scope Dipstick Dipstick Cont'd Micro  Color: Yellow Bilirubin: Neg mg/dL WBC/hpf: 0 - 5/hpf  Appearance: Cloudy Ketones: Neg mg/dL RBC/hpf: 40 - 60/hpf  Specific Gravity: 1.025 Blood: 3+ ery/uL Bacteria: NS (Not Seen)  pH: 5.5 Protein: Trace mg/dL Cystals: NS (Not Seen)  Glucose: Neg mg/dL Urobilinogen: 1.0 mg/dL Casts: NS (Not Seen)    Nitrites: Neg  Trichomonas: Not Present    Leukocyte Esterase: Neg leu/uL Mucous: Present      Epithelial Cells: NS (Not Seen)      Yeast: NS (Not Seen)      Sperm: Not Present    ASSESSMENT:      ICD-10 Details  1 GU:   Renal calculus - N20.0   2   Microscopic hematuria - R31.1    PLAN:           Orders         Schedule X-Rays: 6 Months - KUB  Return Visit/Planned Activity: 6 Months - Follow up MD, KUB          Document Letter(s):  Created for Patient: Clinical Summary         Notes:   Offered surgical management of the stone but he would like to hold off. Repeat KUB in 6 months. He understands risk of progression. I think it is reasonable considering the stone has not really increased in size.   Given his recent negative hematuria workup except for stones, most likely the persistent microscopic hematuria is result of his nonobstructing calculi.   Cc: Philis Fendt, PA    Signed by Link Snuffer, III, M.D. on 12/08/17 at 2:51 PM (EDT)       APPENDED NOTES:  Patient called back. I discussed different options with him including ESWL, ureteroscopy, PCNL. He would like to proceed with ESWL. He does understand that this is a lower pole stone and is 1.3 cm so this may be a staged procedure. He also understands he has 2 stones and therefore this may also cause him to need a staged operation. He would like to proceed with ESWL.   After a thorough review of the management options for the patient's condition the patient  elected to proceed with surgical therapy as noted above. We have discussed the potential benefits and risks of the procedure, side effects of the proposed treatment, the likelihood of the patient achieving the goals of the procedure, and any potential problems that might occur during the procedure or recuperation. Informed consent has been obtained.

## 2018-01-27 ENCOUNTER — Ambulatory Visit (HOSPITAL_COMMUNITY): Payer: BLUE CROSS/BLUE SHIELD

## 2018-01-27 ENCOUNTER — Encounter (HOSPITAL_COMMUNITY)
Admission: RE | Disposition: A | Discharge status: Home / Self Care | Payer: Self-pay | Source: Ambulatory Visit | Attending: Urology

## 2018-01-27 ENCOUNTER — Ambulatory Visit (HOSPITAL_COMMUNITY)
Admission: RE | Admit: 2018-01-27 | Discharge: 2018-01-27 | Disposition: A | Discharge status: Home / Self Care | Payer: BLUE CROSS/BLUE SHIELD | Source: Ambulatory Visit | Attending: Urology | Admitting: Urology

## 2018-01-27 ENCOUNTER — Encounter (HOSPITAL_COMMUNITY): Payer: Self-pay | Admitting: General Practice

## 2018-01-27 DIAGNOSIS — F17210 Nicotine dependence, cigarettes, uncomplicated: Secondary | ICD-10-CM | POA: Diagnosis not present

## 2018-01-27 DIAGNOSIS — I10 Essential (primary) hypertension: Secondary | ICD-10-CM | POA: Insufficient documentation

## 2018-01-27 DIAGNOSIS — N200 Calculus of kidney: Secondary | ICD-10-CM | POA: Insufficient documentation

## 2018-01-27 DIAGNOSIS — Z01818 Encounter for other preprocedural examination: Secondary | ICD-10-CM | POA: Diagnosis not present

## 2018-01-27 HISTORY — DX: Personal history of urinary calculi: Z87.442

## 2018-01-27 HISTORY — PX: EXTRACORPOREAL SHOCK WAVE LITHOTRIPSY: SHX1557

## 2018-01-27 HISTORY — DX: Essential (primary) hypertension: I10

## 2018-01-27 SURGERY — LITHOTRIPSY, ESWL
Anesthesia: LOCAL | Laterality: Left

## 2018-01-27 MED ORDER — SODIUM CHLORIDE 0.9 % IV SOLN
INTRAVENOUS | Status: DC
  Administered 2018-01-27: 08:00:00 via INTRAVENOUS

## 2018-01-27 MED ORDER — DIPHENHYDRAMINE HCL 25 MG PO CAPS
25.0000 mg | ORAL_CAPSULE | ORAL | Status: AC
  Administered 2018-01-27: 25 mg via ORAL
  Filled 2018-01-27: qty 1

## 2018-01-27 MED ORDER — DIAZEPAM 5 MG PO TABS
10.0000 mg | ORAL_TABLET | ORAL | Status: AC
  Administered 2018-01-27: 10 mg via ORAL
  Filled 2018-01-27: qty 2

## 2018-01-27 MED ORDER — CIPROFLOXACIN HCL 500 MG PO TABS
500.0000 mg | ORAL_TABLET | ORAL | Status: AC
  Administered 2018-01-27: 500 mg via ORAL
  Filled 2018-01-27: qty 1

## 2018-01-27 NOTE — Interval H&P Note (Signed)
History and Physical Interval Note:  01/27/2018 9:01 AM  [name]  has presented today for surgery, with the diagnosis of LEFT RENAL STONE  The various methods of treatment have been discussed with the patient and family. After consideration of risks, benefits and other options for treatment, the patient has consented to  Procedure(s): EXTRACORPOREAL SHOCK WAVE LITHOTRIPSY (ESWL) (Left) as a surgical intervention .  The patient's history has been reviewed, patient examined, no change in status, stable for surgery.  I have reviewed the patient's chart and labs.  Questions were answered to the patient's satisfaction.     Infiniti Hoefling A

## 2018-01-27 NOTE — Discharge Instructions (Signed)
I have reviewed discharge instructions in detail with the patient. They will follow-up with me or their physician as scheduled. My nurse will also be calling the patients as per protocol. Dr. Matilde Sprang.  Lithotripsy, Care After This sheet gives you information about how to care for yourself after your procedure. Your health care provider may also give you more specific instructions. If you have problems or questions, contact your health care provider. What can I expect after the procedure? After the procedure, it is common to have:  Some blood in your urine. This should only last for a few days.  Soreness in your back, sides, or upper abdomen for a few days.  Blotches or bruises on your back where the pressure wave entered the skin.  Pain, discomfort, or nausea when pieces (fragments) of the kidney stone move through the tube that carries urine from the kidney to the bladder (ureter). Stone fragments may pass soon after the procedure, but they may continue to pass for up to 4-8 weeks. ? If you have severe pain or nausea, contact your health care provider. This may be caused by a large stone that was not broken up, and this may mean that you need more treatment.  Some pain or discomfort during urination.  Some pain or discomfort in the lower abdomen or (in men) at the base of the penis.  Follow these instructions at home: Medicines  Take over-the-counter and prescription medicines only as told by your health care provider.  If you were prescribed an antibiotic medicine, take it as told by your health care provider. Do not stop taking the antibiotic even if you start to feel better.  Do not drive for 24 hours if you were given a medicine to help you relax (sedative).  Do not drive or use heavy machinery while taking prescription pain medicine. Eating and drinking  Drink enough water and fluids to keep your urine clear or pale yellow. This helps any remaining pieces of the stone to  pass. It can also help prevent new stones from forming.  Eat plenty of fresh fruits and vegetables.  Follow instructions from your health care provider about eating and drinking restrictions. You may be instructed: ? To reduce how much salt (sodium) you eat or drink. Check ingredients and nutrition facts on packaged foods and beverages. ? To reduce how much meat you eat.  Eat the recommended amount of calcium for your age and gender. Ask your health care provider how much calcium you should have. General instructions  Get plenty of rest.  Most people can resume normal activities 1-2 days after the procedure. Ask your health care provider what activities are safe for you.  If directed, strain all urine through the strainer that was provided by your health care provider. ? Keep all fragments for your health care provider to see. Any stones that are found may be sent to a medical lab for examination. The stone may be as small as a grain of salt.  Keep all follow-up visits as told by your health care provider. This is important. Contact a health care provider if:  You have pain that is severe or does not get better with medicine.  You have nausea that is severe or does not go away.  You have blood in your urine longer than your health care provider told you to expect.  You have more blood in your urine.  You have pain during urination that does not go away.  You urinate more  frequently than usual and this does not go away.  You develop a rash or any other possible signs of an allergic reaction. Get help right away if:  You have severe pain in your back, sides, or upper abdomen.  You have severe pain while urinating.  Your urine is very dark red.  You have blood in your stool (feces).  You cannot pass any urine at all.  You feel a strong urge to urinate after emptying your bladder.  You have a fever or chills.  You develop shortness of breath, difficulty breathing, or  chest pain.  You have severe nausea that leads to persistent vomiting.  You faint. Summary  After this procedure, it is common to have some pain, discomfort, or nausea when pieces (fragments) of the kidney stone move through the tube that carries urine from the kidney to the bladder (ureter). If this pain or nausea is severe, however, you should contact your health care provider.  Most people can resume normal activities 1-2 days after the procedure. Ask your health care provider what activities are safe for you.  Drink enough water and fluids to keep your urine clear or pale yellow. This helps any remaining pieces of the stone to pass, and it can help prevent new stones from forming.  If directed, strain your urine and keep all fragments for your health care provider to see. Fragments or stones may be as small as a grain of salt.  Get help right away if you have severe pain in your back, sides, or upper abdomen or have severe pain while urinating. This information is not intended to replace advice given to you by your health care provider. Make sure you discuss any questions you have with your health care provider. Document Released: 03/01/2007 Document Revised: 01/01/2016 Document Reviewed: 01/01/2016 Elsevier Interactive Patient Education  2018 [employer].  Moderate Conscious Sedation, Adult, Care After These instructions provide you with information about caring for yourself after your procedure. Your health care provider may also give you more specific instructions. Your treatment has been planned according to current medical practices, but problems sometimes occur. Call your health care provider if you have any problems or questions after your procedure. What can I expect after the procedure? After your procedure, it is common:  To feel sleepy for several hours.  To feel clumsy and have poor balance for several hours.  To have poor judgment for several hours.  To vomit if you  eat too soon.  Follow these instructions at home: For at least 24 hours after the procedure:   Do not: ? Participate in activities where you could fall or become injured. ? Drive. ? Use heavy machinery. ? Drink alcohol. ? Take sleeping pills or medicines that cause drowsiness. ? Make important decisions or sign legal documents. ? Take care of children on your own.  Rest. Eating and drinking  Follow the diet recommended by your health care provider.  If you vomit: ? Drink water, juice, or soup when you can drink without vomiting. ? Make sure you have little or no nausea before eating solid foods. General instructions  Have a responsible adult stay with you until you are awake and alert.  Take over-the-counter and prescription medicines only as told by your health care provider.  If you smoke, do not smoke without supervision.  Keep all follow-up visits as told by your health care provider. This is important. Contact a health care provider if:  You keep feeling nauseous or  you keep vomiting.  You feel light-headed.  You develop a rash.  You have a fever. Get help right away if:  You have trouble breathing. This information is not intended to replace advice given to you by your health care provider. Make sure you discuss any questions you have with your health care provider. Document Released: 11/30/2012 Document Revised: 07/15/2015 Document Reviewed: 06/01/2015 Elsevier Interactive Patient Education  [employer].

## 2018-01-28 ENCOUNTER — Encounter (HOSPITAL_COMMUNITY): Payer: Self-pay | Admitting: Urology

## 2018-02-07 DIAGNOSIS — H16223 Keratoconjunctivitis sicca, not specified as Sjogren's, bilateral: Secondary | ICD-10-CM | POA: Diagnosis not present

## 2018-02-07 DIAGNOSIS — H40033 Anatomical narrow angle, bilateral: Secondary | ICD-10-CM | POA: Diagnosis not present

## 2018-02-10 DIAGNOSIS — N200 Calculus of kidney: Secondary | ICD-10-CM | POA: Diagnosis not present

## 2018-02-11 DIAGNOSIS — N200 Calculus of kidney: Secondary | ICD-10-CM | POA: Diagnosis not present

## 2018-02-23 HISTORY — PX: TRANSURETHRAL RESECTION OF BLADDER TUMOR: SHX2575

## 2018-02-23 HISTORY — PX: URETEROSCOPY WITH HOLMIUM LASER LITHOTRIPSY: SHX6645

## 2018-02-24 DIAGNOSIS — N200 Calculus of kidney: Secondary | ICD-10-CM | POA: Diagnosis not present

## 2018-02-24 DIAGNOSIS — R8271 Bacteriuria: Secondary | ICD-10-CM | POA: Diagnosis not present

## 2018-03-10 DIAGNOSIS — R311 Benign essential microscopic hematuria: Secondary | ICD-10-CM | POA: Diagnosis not present

## 2018-03-10 DIAGNOSIS — N200 Calculus of kidney: Secondary | ICD-10-CM | POA: Diagnosis not present

## 2018-03-18 ENCOUNTER — Other Ambulatory Visit: Payer: Self-pay | Admitting: Ophthalmology

## 2018-03-18 DIAGNOSIS — N201 Calculus of ureter: Secondary | ICD-10-CM | POA: Diagnosis not present

## 2018-03-18 DIAGNOSIS — N200 Calculus of kidney: Secondary | ICD-10-CM | POA: Diagnosis not present

## 2018-03-18 DIAGNOSIS — N202 Calculus of kidney with calculus of ureter: Secondary | ICD-10-CM | POA: Diagnosis not present

## 2018-03-18 DIAGNOSIS — D494 Neoplasm of unspecified behavior of bladder: Secondary | ICD-10-CM | POA: Diagnosis not present

## 2018-03-18 DIAGNOSIS — D414 Neoplasm of uncertain behavior of bladder: Secondary | ICD-10-CM | POA: Diagnosis not present

## 2018-03-25 DIAGNOSIS — N202 Calculus of kidney with calculus of ureter: Secondary | ICD-10-CM | POA: Diagnosis not present

## 2018-03-25 DIAGNOSIS — R8271 Bacteriuria: Secondary | ICD-10-CM | POA: Diagnosis not present

## 2018-03-29 ENCOUNTER — Other Ambulatory Visit: Payer: BLUE CROSS/BLUE SHIELD

## 2018-04-08 DIAGNOSIS — N202 Calculus of kidney with calculus of ureter: Secondary | ICD-10-CM | POA: Diagnosis not present

## 2018-04-08 DIAGNOSIS — N1339 Other hydronephrosis: Secondary | ICD-10-CM | POA: Diagnosis not present

## 2018-04-08 DIAGNOSIS — N200 Calculus of kidney: Secondary | ICD-10-CM | POA: Diagnosis not present

## 2018-04-08 DIAGNOSIS — N201 Calculus of ureter: Secondary | ICD-10-CM | POA: Diagnosis not present

## 2018-04-19 DIAGNOSIS — N202 Calculus of kidney with calculus of ureter: Secondary | ICD-10-CM | POA: Diagnosis not present

## 2018-04-27 DIAGNOSIS — N200 Calculus of kidney: Secondary | ICD-10-CM | POA: Diagnosis not present

## 2018-05-27 ENCOUNTER — Other Ambulatory Visit: Payer: BLUE CROSS/BLUE SHIELD

## 2018-07-14 ENCOUNTER — Other Ambulatory Visit: Payer: BLUE CROSS/BLUE SHIELD

## 2018-07-14 DIAGNOSIS — N200 Calculus of kidney: Secondary | ICD-10-CM | POA: Diagnosis not present

## 2018-07-14 DIAGNOSIS — D414 Neoplasm of uncertain behavior of bladder: Secondary | ICD-10-CM | POA: Diagnosis not present

## 2018-07-14 DIAGNOSIS — R311 Benign essential microscopic hematuria: Secondary | ICD-10-CM | POA: Diagnosis not present

## 2018-08-01 ENCOUNTER — Other Ambulatory Visit: Payer: Self-pay

## 2018-08-01 ENCOUNTER — Ambulatory Visit
Admission: RE | Admit: 2018-08-01 | Discharge: 2018-08-01 | Disposition: A | Discharge status: Home / Self Care | Payer: BLUE CROSS/BLUE SHIELD | Source: Ambulatory Visit | Attending: Physician Assistant | Admitting: Physician Assistant

## 2018-08-01 DIAGNOSIS — R918 Other nonspecific abnormal finding of lung field: Secondary | ICD-10-CM

## 2018-08-02 ENCOUNTER — Ambulatory Visit (INDEPENDENT_AMBULATORY_CARE_PROVIDER_SITE_OTHER): Payer: BC Managed Care – PPO | Admitting: Neurology

## 2018-08-02 DIAGNOSIS — G4733 Obstructive sleep apnea (adult) (pediatric): Secondary | ICD-10-CM

## 2018-08-02 DIAGNOSIS — R0683 Snoring: Secondary | ICD-10-CM

## 2018-08-02 DIAGNOSIS — G4720 Circadian rhythm sleep disorder, unspecified type: Secondary | ICD-10-CM

## 2018-08-02 DIAGNOSIS — Z9189 Other specified personal risk factors, not elsewhere classified: Secondary | ICD-10-CM

## 2018-08-02 DIAGNOSIS — R0681 Apnea, not elsewhere classified: Secondary | ICD-10-CM

## 2018-08-02 DIAGNOSIS — R0689 Other abnormalities of breathing: Secondary | ICD-10-CM

## 2018-08-02 DIAGNOSIS — R519 Headache, unspecified: Secondary | ICD-10-CM

## 2018-08-02 DIAGNOSIS — R9431 Abnormal electrocardiogram [ECG] [EKG]: Secondary | ICD-10-CM

## 2018-08-02 DIAGNOSIS — I4510 Unspecified right bundle-branch block: Secondary | ICD-10-CM

## 2018-08-02 DIAGNOSIS — G4719 Other hypersomnia: Secondary | ICD-10-CM

## 2018-08-02 DIAGNOSIS — R351 Nocturia: Secondary | ICD-10-CM

## 2018-08-02 DIAGNOSIS — E669 Obesity, unspecified: Secondary | ICD-10-CM

## 2018-08-03 ENCOUNTER — Ambulatory Visit (INDEPENDENT_AMBULATORY_CARE_PROVIDER_SITE_OTHER): Payer: BC Managed Care – PPO | Admitting: Registered Nurse

## 2018-08-03 ENCOUNTER — Encounter: Payer: Self-pay | Admitting: Registered Nurse

## 2018-08-03 ENCOUNTER — Telehealth: Payer: Self-pay

## 2018-08-03 ENCOUNTER — Other Ambulatory Visit: Payer: Self-pay

## 2018-08-03 VITALS — BP 130/83 | HR 87 | Temp 98.3°F | Resp 16 | Wt 258.0 lb

## 2018-08-03 DIAGNOSIS — R918 Other nonspecific abnormal finding of lung field: Secondary | ICD-10-CM

## 2018-08-03 DIAGNOSIS — L2084 Intrinsic (allergic) eczema: Secondary | ICD-10-CM | POA: Diagnosis not present

## 2018-08-03 MED ORDER — BETAMETHASONE DIPROPIONATE 0.05 % EX CREA: TOPICAL_CREAM | Freq: Two times a day (BID) | CUTANEOUS | 2 refills | Status: DC

## 2018-08-03 NOTE — Telephone Encounter (Signed)
-----   Message from Star Age, MD sent at 08/03/2018  8:32 AM EDT ----- Patient referred by Tenna Delaine, PA, seen by me on 123/19, diagnostic PSG on 08/02/18.    Please call and notify the patient that the recent sleep study did confirm the diagnosis of obstructive sleep apnea, moderate to severe (REM and supine more severe). While I recommend treatment for this in the form CPAP, we are not yet bringing patients in for in-lab testing for CPAP titration studies, due to the virus pandemic; therefore, I suggest we start him on a trial of autoPAP at home, which means, that we don't have to bring him in for a second sleep study with CPAP, but will let him start using an autoPAP machine at home, through a DME company (of patient's choice, or as per insurance requirement, as per in [employer], if there are such restrictions, depending on insurance carrier). The DME representative will educate the patient on how to use the machine, how to put the mask on, etc. I have placed an order in the chart. Please send referral, talk to patient, send report to referring MD. We will need a FU in sleep clinic for 10 weeks post-PAP set up, please arrange that with me or one of our NPs.  Also, please remind patient about the importance of compliance with PAP usage; this is an Designer, industrial/product, but good compliance also helps [place] track improvements in patient's sleep related complaints and objective improvements, such as BP and weight for example or nocturia or headaches, etc. For concerns and questions about how to clean the PAP machine and the supplies and how frequently to change the hose, mask and filters, etc., patient can call the DME company, for more information, education and troubleshooting. Especially in the current situation, I recommend, patients be extra mindful about hand hygiene, handling the PAP equipment only with clean hands, wipe the mask daily, keep little one and four-legged companions (and any other  pets for that matter) away from the machine and mask at all times.   He will need FU in 3 mo  Star Age, MD, PhD [facility] ([place])

## 2018-08-03 NOTE — Patient Instructions (Signed)
° ° ° °  If you have lab work done today you will be contacted with your lab results within the next 2 weeks.  If you have not heard from us then please contact us. The fastest way to get your results is to register for My Chart. ° ° °IF you received an x-ray today, you will receive an invoice from [facility]. Please contact [facility] at [phone] with questions or concerns regarding your invoice.  ° °IF you received labwork today, you will receive an invoice from [facility]. Please contact [facility] at [phone] with questions or concerns regarding your invoice.  ° °Our billing staff will not be able to assist you with questions regarding bills from these companies. ° °You will be contacted with the lab results as soon as they are available. The fastest way to get your results is to activate your My Chart account. Instructions are located on the last page of this paperwork. If you have not heard from us regarding the results in 2 weeks, please contact this office. °  ° ° ° °

## 2018-08-03 NOTE — Addendum Note (Signed)
Addended by: Star Age on: 08/03/2018 08:32 AM   Modules accepted: Orders

## 2018-08-03 NOTE — Progress Notes (Signed)
c 

## 2018-08-03 NOTE — Telephone Encounter (Signed)
I called pt. I advised pt that Dr. Rexene Alberts reviewed their sleep study results and found that pt has moderate to severe osa. Dr. Rexene Alberts recommends that pt start an auto pap at home. I reviewed PAP compliance expectations with the pt. Pt is agreeable to starting an auto-PAP. I advised pt that an order will be sent to a DME, [facility], and [facility] will call the pt within about one week after they file with the pt's insurance. [facility] will show the pt how to use the machine, fit for masks, and troubleshoot the auto-PAP if needed. A follow up appt was made for insurance purposes with Dr. Rexene Alberts on 10/17/18 at 8:30am. Pt verbalized understanding to arrive 15 minutes early and bring their auto-PAP. A letter with all of this information in it will be mailed to the pt as a reminder. I verified with the pt that the address we have on file is correct. Pt verbalized understanding of results. Pt had no questions at this time but was encouraged to call back if questions arise. I have sent the order to [facility] and have received confirmation that they have received the order.

## 2018-08-03 NOTE — Progress Notes (Signed)
Established Patient Office Visit  Subjective:  Patient ID: Casey [name], male    DOB: [birth year]  Age: 53 y.o. MRN: [id number]  CC:  Chief Complaint  Patient presents with  . Transitions Of Care    needs to est with pcp to manage care     HPI Casey [name] presents for visit to establish care.  Pt has history as listed on chart  Recent CT scan - we reviewed the results together. Incidentally found atherosclerosis - we discussed what this means and how to ensure heart health going forward.  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 He notices this once or twice daily. He reports that while this is not a major concern, he would rather get ahead of it. He has not had recent imaging, will order.  He recently had a sleep study and was dx with OSA. We discussed that this may have cardiac implications if left untreated.  Casey [name] is very involved in his care. He has been frustrated by the number of different providers he has seen lately, both at [facility] [place] and elsewhere in [place]. We discussed continuity of care and how important it is to him.  He requests refill of betamethasone cream for his eczema.   Past Medical History:  Diagnosis Date  . History of kidney stones   . Hyperglycemia 09/07/2015  . Hypertension   . SINUSITIS, ACUTE 03/08/2008   Qualifier: Diagnosis of  By: Amil Amen MD, Benjamine Mola      Past Surgical History:  Procedure Laterality Date  . APPENDECTOMY  2005  . EXTRACORPOREAL SHOCK WAVE LITHOTRIPSY Left 01/27/2018   Procedure: EXTRACORPOREAL SHOCK WAVE LITHOTRIPSY (ESWL);  Surgeon: Bjorn Loser, MD;  Location: [facility];  Service: Urology;  Laterality: Left;  . WISDOM TOOTH EXTRACTION      Family History  Problem Relation Age of Onset  . Stroke Mother   . Hypertension Father   . Bladder Cancer Father   . Colon cancer Neg Hx     Social History   Socioeconomic History  . Marital status: Single    Spouse name: Not on file  .  Number of children: Not on file  . Years of education: Not on file  . Highest education level: Not on file  Occupational History  . Not on file  Social Needs  . Financial resource strain: Not on file  . Food insecurity:    Worry: Not on file    Inability: Not on file  . Transportation needs:    Medical: Not on file    Non-medical: Not on file  Tobacco Use  . Smoking status: Former Smoker    Packs/day: 1.50    Years: 35.00    Pack years: 52.50    Last attempt to quit: 11/24/2015    Years since quitting: 2.6  . Smokeless tobacco: Never Used  Substance and Sexual Activity  . Alcohol use: No    Alcohol/week: 0.0 standard drinks    Frequency: Never    Comment: rare   . Drug use: No  . Sexual activity: Not Currently  Lifestyle  . Physical activity:    Days per week: Not on file    Minutes per session: Not on file  . Stress: Not on file  Relationships  . Social connections:    Talks on phone: Not on file    Gets together: Not on file    Attends religious service: Not on file    Active member of club  or organization: Not on file    Attends meetings of clubs or organizations: Not on file    Relationship status: Not on file  . Intimate partner violence:    Fear of current or ex partner: Not on file    Emotionally abused: Not on file    Physically abused: Not on file    Forced sexual activity: Not on file  Other Topics Concern  . Not on file  Social History Narrative  . Not on file    Outpatient Medications Prior to Visit  Medication Sig Dispense Refill  . betamethasone dipropionate (DIPROLENE) 0.05 % cream Apply topically 2 (two) times daily.    . fluticasone (FLONASE) 50 MCG/ACT nasal spray Place 2 sprays into both nostrils daily.    Marland Kitchen lisinopril (PRINIVIL,ZESTRIL) 20 MG tablet Take 1 tablet (20 mg total) by mouth daily. 90 tablet 3  . Olopatadine HCl (PAZEO) 0.7 % SOLN Apply to eye daily.    Marland Kitchen triamcinolone cream (KENALOG) 0.1 % Apply 1 application topically 2 (two)  times daily. 60 g 11   Facility-Administered Medications Prior to Visit  Medication Dose Route Frequency Provider Last Rate Last Dose  . 0.9 %  sodium chloride infusion  500 mL Intravenous Once Armbruster, Carlota Raspberry, MD        Allergies  Allergen Reactions  . Fish Allergy Rash  . Penicillins Rash    REACTION: had reaction as an infant--cannot recall reaction    ROS Review of Systems  Constitutional: Negative.   HENT: Positive for ear pain. Negative for ear discharge.   Eyes: Negative.   Respiratory: Negative.   Cardiovascular: Negative.   Gastrointestinal: Negative.   Endocrine: Negative.   Genitourinary: Negative.   Musculoskeletal: Negative.   Skin: Negative.   Allergic/Immunologic: Negative.   Neurological: Negative.   Hematological: Negative.   Psychiatric/Behavioral: Negative.   All other systems reviewed and are negative.     Objective:    Physical Exam  Constitutional: He is oriented to person, place, and time. He appears well-developed and well-nourished.  Cardiovascular: Normal rate and regular rhythm.  Pulmonary/Chest: Effort normal and breath sounds normal.  Neurological: He is alert and oriented to person, place, and time.  Skin: Skin is warm and dry.  Psychiatric: He has a normal mood and affect. His behavior is normal. Judgment and thought content normal.  Nursing note and vitals reviewed.   BP 130/83   Pulse 87   Temp 98.3 F (36.8 C) (Oral)   Resp 16   Wt 258 lb (117 kg)   SpO2 98%   BMI 33.13 kg/m  Wt Readings from Last 3 Encounters:  08/03/18 258 lb (117 kg)  01/27/18 255 lb (115.7 kg)  01/25/18 251 lb (113.9 kg)     Health Maintenance Due  Topic Date Due  . HIV Screening  06/22/1980    There are no preventive care reminders to display for this patient.  Lab Results  Component Value Date   TSH 1.880 12/23/2017   Lab Results  Component Value Date   WBC 5.5 12/23/2017   HGB 15.9 12/23/2017   HCT 45.0 12/23/2017   MCV 92  12/23/2017   PLT 289 12/23/2017   Lab Results  Component Value Date   NA 137 12/23/2017   K 4.1 12/23/2017   CO2 20 12/23/2017   GLUCOSE 98 12/23/2017   BUN 13 12/23/2017   CREATININE 0.81 12/23/2017   BILITOT 1.3 (H) 12/23/2017   ALKPHOS 63 12/23/2017   AST 58 (  H) 12/23/2017   ALT 58 (H) 12/23/2017   PROT 7.2 12/23/2017   ALBUMIN 4.4 12/23/2017   CALCIUM 9.6 12/23/2017   Lab Results  Component Value Date   CHOL 132 12/23/2017   Lab Results  Component Value Date   HDL 27 (L) 12/23/2017   Lab Results  Component Value Date   LDLCALC 70 12/23/2017   Lab Results  Component Value Date   TRIG 173 (H) 12/23/2017   Lab Results  Component Value Date   CHOLHDL 4.9 12/23/2017   Lab Results  Component Value Date   HGBA1C 5.7 (H) 12/23/2017      Assessment & Plan:   Problem List Items Addressed This Visit    None      No orders of the defined types were placed in this encounter.   Follow-up: No follow-ups on file.   PLAN:  Refilled betamethasone  Ordered knee xray - sounds like OA, but also concerned for meniscal injury - will follow up based on imaging.  Will plan to see him again within a few months for labs and CPE  Patient encouraged to call clinic with any questions, comments, or concerns.  I spent 50 minutes with this patient, more than 50% of which was spent counseling/educating.   Maximiano Coss, NP

## 2018-08-03 NOTE — Progress Notes (Signed)
Patient referred by Tenna Delaine, PA, seen by me on 123/19, diagnostic PSG on 08/02/18.    Please call and notify the patient that the recent sleep study did confirm the diagnosis of obstructive sleep apnea, moderate to severe (REM and supine more severe). While I recommend treatment for this in the form CPAP, we are not yet bringing patients in for in-lab testing for CPAP titration studies, due to the virus pandemic; therefore, I suggest we start him on a trial of autoPAP at home, which means, that we don't have to bring him in for a second sleep study with CPAP, but will let him start using an autoPAP machine at home, through a DME company (of patient's choice, or as per insurance requirement, as per in [employer], if there are such restrictions, depending on insurance carrier). The DME representative will educate the patient on how to use the machine, how to put the mask on, etc. I have placed an order in the chart. Please send referral, talk to patient, send report to referring MD. We will need a FU in sleep clinic for 10 weeks post-PAP set up, please arrange that with me or one of our NPs.  Also, please remind patient about the importance of compliance with PAP usage; this is an Designer, industrial/product, but good compliance also helps [place] track improvements in patient's sleep related complaints and objective improvements, such as BP and weight for example or nocturia or headaches, etc. For concerns and questions about how to clean the PAP machine and the supplies and how frequently to change the hose, mask and filters, etc., patient can call the DME company, for more information, education and troubleshooting. Especially in the current situation, I recommend, patients be extra mindful about hand hygiene, handling the PAP equipment only with clean hands, wipe the mask daily, keep little one and four-legged companions (and any other pets for that matter) away from the machine and mask at all times.   He  will need FU in 3 mo  Star Age, MD, PhD [facility] ([place])

## 2018-08-03 NOTE — Procedures (Signed)
PATIENT'S NAME:  Casey [name], Casey [name] DOB:      1965-04-22      MR#:    [id number]     DATE OF RECORDING: 08/02/2018 REFERRING M.D.:  Leonie Douglas, PA-C Study Performed:   Baseline Polysomnogram HISTORY: 53 year old man with a history of hypertension, eczema, prediabetes, prior smoking, kidney stones, and obesity, who reports snoring and excessive daytime somnolence as well as witnessed apneas. The patient endorsed the Epworth Sleepiness Scale at 15/24 points. The patient's weight 251 pounds with a height of 74 (inches), resulting in a BMI of 32.3 kg/m2. The patient's neck circumference measured 19.5 inches.  CURRENT MEDICATIONS: Flonase, Prinivil, Pazeo, Kenalog   PROCEDURE:  This is a multichannel digital polysomnogram utilizing the Somnostar 11.2 system.  Electrodes and sensors were applied and monitored per AASM Specifications.   EEG, EOG, Chin and Limb EMG, were sampled at 200 Hz.  ECG, Snore and Nasal Pressure, Thermal Airflow, Respiratory Effort, CPAP Flow and Pressure, Oximetry was sampled at 50 Hz. Digital video and audio were recorded.      BASELINE STUDY  Lights Out was at 00:03 and Lights On at 06:40.  Total recording time (TRT) was 398 minutes, with a total sleep time (TST) of 257.5 minutes.   The patient's sleep latency was 57 minutes, which is delayed. REM latency was 141.5 minutes, which is mildly delayed. The sleep efficiency was 64.7%, which is reduced.     SLEEP ARCHITECTURE: WASO (Wake after sleep onset) was 77 minutes with mild to moderate sleep fragmentation noted.  There were 9 minutes in Stage N1, 184.5 minutes Stage N2, 50 minutes Stage N3 and 14 minutes in Stage REM.  The percentage of Stage N1 was 3.5%, Stage N2 was 71.7%, which is increased, Stage N3 was 19.4%, which is normal, and Stage R (REM sleep) was 5.4%, which is markedly reduced. The arousals were noted as: 61 were spontaneous, 21 were associated with PLMs, 56 were associated with respiratory events.  RESPIRATORY  ANALYSIS:  There were a total of 114 respiratory events:  45 obstructive apneas, 33 central apneas and 0 mixed apneas with a total of 78 apneas and an apnea index (AI) of 18.2 /hour. There were 36 hypopneas with a hypopnea index of 8.4 /hour. The patient also had 0 respiratory event related arousals (RERAs).      The total APNEA/HYPOPNEA INDEX (AHI) was 26.6 /hour and the total RESPIRATORY DISTURBANCE INDEX was 26.6 /hour.  11 events occurred in REM sleep and 83 events in NREM. The REM AHI was 47.1 /hour, versus a non-REM AHI of 25.4. The patient spent 64.5 minutes of total sleep time in the supine position and 193 minutes in non-supine.. The supine AHI was 80.9 versus a non-supine AHI of 8.4.  OXYGEN SATURATION & C02:  The Wake baseline 02 saturation was 96%, with the lowest being 86%. Time spent below 89% saturation equaled 4 minutes.  PERIODIC LIMB MOVEMENTS: The patient had a total of 43 Periodic Limb Movements.  The Periodic Limb Movement (PLM) index was 10/hour and the PLM Arousal index was 4.9/hour.  Audio and video analysis did not show any abnormal or unusual movements, behaviors, phonations or vocalizations. The patient took 1 bathroom break. Mild to moderate snoring was noted. The EKG was in keeping with normal sinus rhythm (NSR) with occasional PVCs and compensatory pauses noted.  Post-study, the patient indicated that sleep was worse than usual.   IMPRESSION:  1. Obstructive Sleep Apnea (OSA) 2. Dysfunctions associated with sleep stages or  arousal from sleep 3. Non-specific abnormal EKG  RECOMMENDATIONS:  1. This study demonstrates moderate to severe obstructive sleep apnea, with a total AHI of 26.6/hour, REM AHI of 47.1/hour, supine AHI of 89/hour and O2 nadir of 86%. Treatment with positive airway pressure in the form of CPAP is recommended. This will require a full night CPAP titration study for proper treatment settings, O2 monitoring and mask fitting. Based on the severity of  the sleep disordered breathing, an attended titration study is indicated. However, under the current circumstances (i.e. the COVID-19 pandemic), in order to ensure continuity of care and for the safety of the patient and healthcare professionals, he will be advised to proceed with an autoPAP titration/trial at home. A proper overnight, lab-attended PAP titration study with CPAP may be helpful or needed down the road to optimize treatment, when considered safe.  2. Please note, that untreated obstructive sleep apnea may carry additional perioperative morbidity. Patients with significant obstructive sleep apnea should receive perioperative PAP therapy and the surgeons and particularly the anesthesiologist should be informed of the diagnosis and the severity of the sleep disordered breathing. Patient will be reminded regarding compliance with the PAP machine and to be mindful of cleanliness with the equipment and timely with supply changes (i.e. changing filter, mask, hose, humidifier chamber on an ongoing basis, as recommended, and cleaning parts that touch the face and nose daily, etc).  3. The patient should be cautioned not to drive, work at heights, or operate dangerous or heavy equipment when tired or sleepy. Review and reiteration of good sleep hygiene measures should be pursued with any patient. 4. Other treatment options for OSA (generally speaking) may include avoidance of supine sleep position along with weight loss, upper airway or jaw surgery in selected patients or the use of an oral appliance in certain patients. ENT evaluation and/or consultation with a maxillofacial surgeon or dentist may be feasible in some instances.    5. This study shows sleep fragmentation and abnormal sleep stage percentages; these are nonspecific findings and per se do not signify an intrinsic sleep disorder or a cause for the patient's sleep-related symptoms. Causes include (but are not limited to) the first night effect of  the sleep study, circadian rhythm disturbances, medication effect or an underlying mood disorder or medical problem.  6. The study showed occasional PVCs on single lead EKG; clinical correlation is recommended and consultation with cardiology may be feasible. He has seen Dr. Einar Gip. 7. The patient will be seen in follow-up in the sleep clinic at [facility] for discussion of the test results, symptom and treatment compliance review, further management strategies, etc. The referring provider will be notified of the test results.  I certify that I have reviewed the entire raw data recording prior to the issuance of this report in accordance with the Standards of Accreditation of the American Academy of Sleep Medicine (AASM)   Star Age, MD, PhD [facility]) Diplomat, ABPN (Neurology and Sleep)

## 2018-08-11 DIAGNOSIS — G4733 Obstructive sleep apnea (adult) (pediatric): Secondary | ICD-10-CM | POA: Diagnosis not present

## 2018-08-22 ENCOUNTER — Other Ambulatory Visit: Payer: Self-pay | Admitting: Registered Nurse

## 2018-08-22 ENCOUNTER — Encounter: Payer: Self-pay | Admitting: Registered Nurse

## 2018-08-22 ENCOUNTER — Telehealth: Payer: Self-pay | Admitting: Registered Nurse

## 2018-08-22 DIAGNOSIS — M25561 Pain in right knee: Secondary | ICD-10-CM

## 2018-08-22 NOTE — Progress Notes (Signed)
Orders sent for knee imaging - bilateral - patient having pain in both knees, concerned for OA or other process  Kathrin Ruddy, NP

## 2018-08-22 NOTE — Telephone Encounter (Signed)
Copied from [place] [id number]. Topic: General - Other >> Aug 22, 2018 12:00 PM Lennox Solders wrote: Reason for CRM: pt saw richard on 08-03-2018. Pt is still waiting for knee xray to be ordered. Please notified pt through mychart with location etc

## 2018-08-31 ENCOUNTER — Ambulatory Visit
Admission: RE | Admit: 2018-08-31 | Discharge: 2018-08-31 | Disposition: A | Discharge status: Home / Self Care | Payer: BC Managed Care – PPO | Source: Ambulatory Visit | Attending: Registered Nurse | Admitting: Registered Nurse

## 2018-08-31 DIAGNOSIS — M25561 Pain in right knee: Secondary | ICD-10-CM

## 2018-08-31 DIAGNOSIS — M25562 Pain in left knee: Secondary | ICD-10-CM

## 2018-09-09 ENCOUNTER — Telehealth (INDEPENDENT_AMBULATORY_CARE_PROVIDER_SITE_OTHER): Payer: BC Managed Care – PPO | Admitting: Registered Nurse

## 2018-09-09 ENCOUNTER — Other Ambulatory Visit: Payer: Self-pay

## 2018-09-09 DIAGNOSIS — M25561 Pain in right knee: Secondary | ICD-10-CM

## 2018-09-09 DIAGNOSIS — M25562 Pain in left knee: Secondary | ICD-10-CM | POA: Diagnosis not present

## 2018-09-09 NOTE — Progress Notes (Signed)
Telemedicine Encounter- SOAP NOTE Established Patient  This telephone encounter was conducted with the patient's (or proxy's) verbal consent via audio telecommunications: yes   Patient was instructed to have this encounter in a suitably private space; and to only have persons present to whom they give permission to participate. In addition, patient identity was confirmed by use of name plus two identifiers (DOB and address).  I discussed the limitations, risks, security and privacy concerns of performing an evaluation and management service by telephone and the availability of in person appointments. I also discussed with the patient that there may be a patient responsible charge related to this service. The patient expressed understanding and agreed to proceed.  I spent a total of 16 mintues talking with the patient or their proxy.  No chief complaint on file.   Subjective   Casey [name] is a 53 y.o. established patient. Telephone visit today to review Xray results  Mr glaus has been having ongoing bilateral knee pain that is intermittent and sharp in nature. He states it is worst when his knees are flexed for extended periods of time and that stairs give him trouble. He states that some days are worse than others - and while he has not fallen, he has come close. He denies preceding injury. He denies redness, swelling, ttp    Patient Active Problem List   Diagnosis Date Noted  . HTN (hypertension) 09/07/2015  . Eczema 04/06/2015    Past Medical History:  Diagnosis Date  . History of kidney stones   . Hyperglycemia 09/07/2015  . Hypertension   . SINUSITIS, ACUTE 03/08/2008   Qualifier: Diagnosis of  By: Amil Amen MD, Benjamine Mola      Current Outpatient Medications  Medication Sig Dispense Refill  . betamethasone dipropionate (DIPROLENE) 0.05 % cream Apply topically 2 (two) times daily. 30 g 2  . lisinopril (PRINIVIL,ZESTRIL) 20 MG tablet Take 1 tablet (20 mg total) by mouth  daily. 90 tablet 3   Current Facility-Administered Medications  Medication Dose Route Frequency Provider Last Rate Last Dose  . 0.9 %  sodium chloride infusion  500 mL Intravenous Once Armbruster, Carlota Raspberry, MD        Allergies  Allergen Reactions  . Fish Allergy Rash  . Penicillins Rash    REACTION: had reaction as an infant--cannot recall reaction    Social History   Socioeconomic History  . Marital status: Single    Spouse name: Not on file  . Number of children: Not on file  . Years of education: Not on file  . Highest education level: Not on file  Occupational History  . Not on file  Social Needs  . Financial resource strain: Not on file  . Food insecurity    Worry: Not on file    Inability: Not on file  . Transportation needs    Medical: Not on file    Non-medical: Not on file  Tobacco Use  . Smoking status: Former Smoker    Packs/day: 1.50    Years: 35.00    Pack years: 52.50    Quit date: 11/24/2015    Years since quitting: 2.7  . Smokeless tobacco: Never Used  Substance and Sexual Activity  . Alcohol use: No    Alcohol/week: 0.0 standard drinks    Frequency: Never    Comment: rare   . Drug use: No  . Sexual activity: Not Currently  Lifestyle  . Physical activity    Days per week: Not on file  Minutes per session: Not on file  . Stress: Not on file  Relationships  . Social Herbalist on phone: Not on file    Gets together: Not on file    Attends religious service: Not on file    Active member of club or organization: Not on file    Attends meetings of clubs or organizations: Not on file    Relationship status: Not on file  . Intimate partner violence    Fear of current or ex partner: Not on file    Emotionally abused: Not on file    Physically abused: Not on file    Forced sexual activity: Not on file  Other Topics Concern  . Not on file  Social History Narrative  . Not on file    Review of Systems  Constitutional: Negative.    HENT: Negative.   Eyes: Negative.   Respiratory: Negative.   Cardiovascular: Negative.   Gastrointestinal: Negative.   Genitourinary: Negative.   Musculoskeletal: Positive for joint pain.  Skin: Negative.   Neurological: Negative.   Endo/Heme/Allergies: Negative.   Psychiatric/Behavioral: Negative.   All other systems reviewed and are negative.   Objective   Vitals as reported by the patient: There were no vitals filed for this visit.  Diagnoses and all orders for this visit:  Pain in both knees, unspecified chronicity -     Ambulatory referral to Sports Medicine   PLAN  Bilat sharp knee pain, worse with flexion. Virtual visit, did not see patient in person  Reviewed xrays - negative for any acute abnormality or signs of OA.  Patellofemoral Pain vs. Bursitis vs. Chondromalacia - sounds like an overuse component, as such, Sports medicine may benefit this patient more than PT.  Patient encouraged to call clinic with any questions, comments, or concerns.    I discussed the assessment and treatment plan with the patient. The patient was provided an opportunity to ask questions and all were answered. The patient agreed with the plan and demonstrated an understanding of the instructions.   The patient was advised to call back or seek an in-person evaluation if the symptoms worsen or if the condition fails to improve as anticipated.  I provided 16 minutes of non-face-to-face time during this encounter.  Maximiano Coss, NP  Primary Care at [facility]

## 2018-09-09 NOTE — Progress Notes (Signed)
Pt states he needs to go over his xray's and discuss knee pain.

## 2018-09-10 DIAGNOSIS — G4733 Obstructive sleep apnea (adult) (pediatric): Secondary | ICD-10-CM | POA: Diagnosis not present

## 2018-09-19 ENCOUNTER — Encounter: Payer: Self-pay | Admitting: Family Medicine

## 2018-09-19 ENCOUNTER — Ambulatory Visit (INDEPENDENT_AMBULATORY_CARE_PROVIDER_SITE_OTHER): Payer: BC Managed Care – PPO | Admitting: Family Medicine

## 2018-09-19 ENCOUNTER — Other Ambulatory Visit: Payer: Self-pay

## 2018-09-19 DIAGNOSIS — M222X2 Patellofemoral disorders, left knee: Secondary | ICD-10-CM | POA: Diagnosis not present

## 2018-09-19 DIAGNOSIS — M222X1 Patellofemoral disorders, right knee: Secondary | ICD-10-CM | POA: Diagnosis not present

## 2018-09-19 NOTE — Progress Notes (Signed)
   PCP: Maximiano Coss, NP  Subjective:   HPI: Patient is a 53 y.o. male here for bilateral knee pain x years, slowly progressing. Noticed pain when kneeling and sitting for prolonged periods of time. He now notes that he has an "ache", especially when climbing stairs. Some days are better than others. He notes that sometimes they feel like they are going to collapse. Denies any trauma. Notes he jumped from a higher area and noticed some pain with that but no abnormal twisting or clicks/clunks. Has not treated it with anything. He notes pain is 2-3/10. He notes he has two colleagues that are older and have had total knee replacements, thus he felt he needed to come in to have them seen. Denies pain first thing in the morning. Usually gets pain later in the afternoon. Resting seems to make it better and "walking it off".    Review of Systems:  Per HPI.  No swelling or skin changes.  [place], medications and smoking status reviewed.      Objective:  Physical Exam:  Gen: awake, alert, NAD, comfortable in exam room Pulm: breathing unlabored  Knee, Right: - Inspection: no gross deformity. No swelling/effusion, erythema or bruising. Skin intact - Palpation: no TTP - ROM: full active ROM with flexion and extension in knee and hip - Strength: 5/5 strength - Neuro/vasc: NV intact - Special Tests: - LIGAMENTS: negative anterior and posterior drawer, negative Lachman's, no MCL or LCL laxity  -- MENISCUS: negative McMurray's, negative Thessaly  -- PF JOINT: nml patellar mobility bilaterally.  negative patellar grind, negative patellar apprehension  Knee, Left: - Inspection: no gross deformity. No swelling/effusion, erythema or bruising. Skin intact - Palpation: no TTP - ROM: full active ROM with flexion and extension in knee and hip - Strength: 5/5 strength - Neuro/vasc: NV intact - Special Tests: - LIGAMENTS: negative anterior and posterior drawer, negative Lachman's, no MCL or LCL laxity   -- MENISCUS: negative McMurray's, negative Thessaly  -- PF JOINT: nml patellar mobility bilaterally.  negative patellar grind, negative patellar apprehension  Hips: normal ROM, negative FABER and FADIR bilaterally   Assessment & Plan:   Patellofemoral pain syndrome of both knees History and physical exam most consistent with bilateral patellofemoral syndrome. Recommend conservative therapy including home exercises, insoles, ice PRN, and tylenol/ibuprofen PRN for pain. Advised patient to avoid painful activities including deep squats. RTC as needed or if worsening.  Casey [name], Casey [name], PGY2 09/19/2018 12:57 PM

## 2018-09-19 NOTE — Assessment & Plan Note (Addendum)
History and physical exam most consistent with bilateral patellofemoral syndrome. Recommend conservative therapy including home exercises, insoles, ice PRN, and tylenol/ibuprofen PRN for pain. Advised patient to avoid painful activities including deep squats. RTC as needed or if worsening.

## 2018-09-19 NOTE — Patient Instructions (Signed)
You have patellofemoral syndrome and minimal arthritis of your knees. Avoid painful activities when possible (often deep squats, lunges bother this). Straight leg raise, hip side raises, straight leg raises with foot turned outwards 3 sets of 10 once a day. Add ankle weight if these become too easy. Consider formal physical therapy. Correct foot breakdown with something like dr. Zoe Lan active series, spencos, or our green sports insoles if this continues to be a problem. Avoid flat shoes, barefoot walking as much as possible. Icing 15 minutes at a time 3-4 times a day as needed. Tylenol or ibuprofen as needed for pain. Follow up with me as needed.

## 2018-10-11 DIAGNOSIS — G4733 Obstructive sleep apnea (adult) (pediatric): Secondary | ICD-10-CM | POA: Diagnosis not present

## 2018-10-13 ENCOUNTER — Encounter: Payer: Self-pay | Admitting: Neurology

## 2018-10-17 ENCOUNTER — Other Ambulatory Visit: Payer: Self-pay

## 2018-10-17 ENCOUNTER — Encounter: Payer: Self-pay | Admitting: Neurology

## 2018-10-17 ENCOUNTER — Ambulatory Visit (INDEPENDENT_AMBULATORY_CARE_PROVIDER_SITE_OTHER): Payer: BC Managed Care – PPO | Admitting: Neurology

## 2018-10-17 VITALS — BP 135/89 | HR 85 | Ht 74.0 in | Wt 253.0 lb

## 2018-10-17 DIAGNOSIS — Z9989 Dependence on other enabling machines and devices: Secondary | ICD-10-CM | POA: Diagnosis not present

## 2018-10-17 DIAGNOSIS — G4733 Obstructive sleep apnea (adult) (pediatric): Secondary | ICD-10-CM

## 2018-10-17 NOTE — Progress Notes (Signed)
Subjective:    Patient ID: Casey [name] is a 53 y.o. male.  HPI     Interim history:   Casey [name] is a 53 year old right-handed gentleman with an underlying medical history of hypertension, eczema, prediabetes, prior smoking, kidney stones, and obesity, who presents for follow-up consultation of his obstructive sleep apnea, after interim sleep study testing and starting AutoPap therapy.  The patient is unaccompanied today.  I first met him on 01/25/2018, at the request of his primary care provider, at which time he reported snoring, daytime somnolence and witnessed apneas.  He was advised to proceed with a sleep study.  He had a baseline sleep study on 08/02/2018.  I went over his test results with him in detail today.  Sleep efficiency was 64.7%, sleep latency 57 minutes which is delayed, REM latency mildly delayed at 141.5 minutes.  He had an increased percentage of stage II sleep, REM sleep was markedly reduced at 5.4%.  Total AHI was in the moderate range at 26.6/h, REM AHI 47.1/h, supine AHI 80.9/h, average oxygen saturation 96%, nadir was 86%.  He had no significant PLM's.  He had occasional PVCs.  He was advised to start with AutoPap therapy given that we were in the midst of the COVID-19 pandemic.  Today, 10/17/2018: I reviewed his AutoPap compliance data from 09/13/2018 through 10/12/2018 which is a total of 30 days, during which time he used his AutoPap 29 days with percent used days greater than 4 hours at 97%, indicating excellent compliance with an average usage of 6 hours and 46 minutes, residual AHI at goal at 0.7/h, average pressure for the 95th percentile at 12.1 cm, range of 7 cm to 13 cm, average leak low with the 95th percentile at 5.3 L/min.  Set up date was 08/11/2018.  He missed today because of power outage.  He reports still adjusting to treatment, otherwise he is doing well, he has noticed improvement in his sleep quality, more noticeable in the first couple of weeks.  He does not  feel the need to take a nap in the afternoons.  He is working on weight loss.  He tries to hydrate well with water.  He works in the office, as a Government social research officer.  He reports having seen Dr. Einar Gip in the interim.  He some interim testing which was largely benign per patient, he was told that he had a bundle branch block.  The patient's allergies, current medications, family history, past medical history, past social history, past surgical history and problem list were reviewed and updated as appropriate.   Previously:  01/25/2018: (He) reports snoring and excessive daytime somnolence as well as witnessed apneas. I reviewed your office note from 12/23/2017. His Epworth sleepiness score is 15 out of 24 today, fatigue score is 46 out of 63. He has woken up with a sense of gasping for air. He is single and lives alone, he quit smoking in July 2017, drinks alcohol rarely, drinks caffeine daily in the form of coffee and sodas, about 3-4 servings per day on average. His bedtime is around 11:30 PM and rise time around 7. He has nocturia, typically about once per average night, he has had morning headaches and has been told that he pauses in his snoring while asleep. He has a history of left shoulder discomfort, he had an EKG recently which showed right bundle branch block and saw Dr. Einar Gip in cardiology, had further workup which was benign per patient report. He is scheduled for lithotripsy  in 2 days. He has no pets, no children, has a TV in the bedroom and puts it on a timer typically. He works as a Government social research officer. He is not aware of any family history of OSA. He is familiar with the diagnosis as his stepfather has sleep apnea and uses a CPAP machine. Patient would be willing to consider CPAP therapy. His weight has been fluctuating.  His Past Medical History Is Significant For: Past Medical History:  Diagnosis Date  . History of kidney stones   . Hyperglycemia 09/07/2015  . Hypertension   . SINUSITIS, ACUTE  03/08/2008   Qualifier: Diagnosis of  By: Amil Amen MD, Benjamine Mola      His Past Surgical History Is Significant For: Past Surgical History:  Procedure Laterality Date  . APPENDECTOMY  2005  . EXTRACORPOREAL SHOCK WAVE LITHOTRIPSY Left 01/27/2018   Procedure: EXTRACORPOREAL SHOCK WAVE LITHOTRIPSY (ESWL);  Surgeon: Bjorn Loser, MD;  Location: [facility];  Service: Urology;  Laterality: Left;  . WISDOM TOOTH EXTRACTION      His Family History Is Significant For: Family History  Problem Relation Age of Onset  . Stroke Mother   . Hypertension Father   . Bladder Cancer Father   . Colon cancer Neg Hx     His Social History Is Significant For: Social History   Socioeconomic History  . Marital status: Single    Spouse name: Not on file  . Number of children: Not on file  . Years of education: Not on file  . Highest education level: Not on file  Occupational History  . Not on file  Social Needs  . Financial resource strain: Not on file  . Food insecurity    Worry: Not on file    Inability: Not on file  . Transportation needs    Medical: Not on file    Non-medical: Not on file  Tobacco Use  . Smoking status: Former Smoker    Packs/day: 1.50    Years: 35.00    Pack years: 52.50    Quit date: 11/24/2015    Years since quitting: 2.8  . Smokeless tobacco: Never Used  Substance and Sexual Activity  . Alcohol use: No    Alcohol/week: 0.0 standard drinks    Frequency: Never    Comment: rare   . Drug use: No  . Sexual activity: Not Currently  Lifestyle  . Physical activity    Days per week: Not on file    Minutes per session: Not on file  . Stress: Not on file  Relationships  . Social Herbalist on phone: Not on file    Gets together: Not on file    Attends religious service: Not on file    Active member of club or organization: Not on file    Attends meetings of clubs or organizations: Not on file    Relationship status: Not on file  Other Topics Concern  .  Not on file  Social History Narrative  . Not on file    His Allergies Are:  Allergies  Allergen Reactions  . Fish Allergy Rash  . Penicillins Rash    REACTION: had reaction as an infant--cannot recall reaction  :   His Current Medications Are:  Outpatient Encounter Medications as of 10/17/2018  Medication Sig  . betamethasone dipropionate (DIPROLENE) 0.05 % cream Apply topically 2 (two) times daily.  Marland Kitchen lisinopril (PRINIVIL,ZESTRIL) 20 MG tablet Take 1 tablet (20 mg total) by mouth daily.   Facility-Administered  Encounter Medications as of 10/17/2018  Medication  . 0.9 %  sodium chloride infusion  :  Review of Systems:  Out of a complete 14 point review of systems, all are reviewed and negative with the exception of these symptoms as listed below:  Review of Systems  Neurological:       Pt presents today to discuss his auto pap. Pt reports that his afternoon fatigue has improved.  Epworth Sleepiness Scale 0= would never doze 1= slight chance of dozing 2= moderate chance of dozing 3= high chance of dozing  Sitting and reading: 1 Watching TV: 1 Sitting inactive in a public place (ex. Theater or meeting): 0 As a passenger in a car for an hour without a break: 1 Lying down to rest in the afternoon: 3 Sitting and talking to someone: 0 Sitting quietly after lunch (no alcohol): 0 In a car, while stopped in traffic: 0 Total: 6     Objective:  Neurological Exam  Physical Exam Physical Examination:   Vitals:   10/17/18 0811  BP: 135/89  Pulse: 85    General Examination: The patient is a very pleasant 53 y.o. male in no acute distress. He appears well-developed and well-nourished and well groomed.   HEENT: Normocephalic, atraumatic, pupils are equal, round and reactive to light. He has corrective eyeglasses in place. Extraocular tracking is good without limitation to gaze excursion or nystagmus noted. Normal smooth pursuit is noted. Hearing is grossly intact. Face is  symmetric with normal facial animation and normal facial sensation. Speech is clear with no dysarthria noted. There is no hypophonia. There is no lip, neck/head, jaw or voice tremor. Neck shows FROM. There are no carotid bruits on auscultation. Oropharynx exam reveals: mild mouth dryness, adequate dental hygiene and Moderate airway crowding. Tongue protrudes centrally and palate elevates symmetrically.  Chest: Clear to auscultation without wheezing, rhonchi or crackles noted.  Heart: S1+S2+0, regular and normal without murmurs, rubs or gallops noted.   Abdomen: Soft, non-tender and non-distended.  Extremities: There is no pitting edema in the distal lower extremities bilaterally.   Skin: Warm and dry with some eczematous changes noted in the distal legs.  Musculoskeletal: exam reveals no obvious joint deformities, tenderness or joint swelling or erythema.   Neurologically:  Mental status: The patient is awake, alert and oriented in all 4 spheres. His immediate and remote memory, attention, language skills and fund of knowledge are appropriate. There is no evidence of aphasia, agnosia, apraxia or anomia. Speech is clear with normal prosody and enunciation. Thought process is linear. Mood is normal and affect is normal.  Cranial nerves II - XII are as described above under HEENT exam.  Motor exam: Normal bulk, strength and tone is noted. There is no tremor, Romberg is negative. Fine motor skills and coordination: grossly intact.  Cerebellar testing: No dysmetria or intention tremor.  Sensory exam: intact to light touch in the upper and lower extremities.  Gait, station and balance: He stands easily. No veering to one side is noted. No leaning to one side is noted. Posture is age-appropriate and stance is narrow based. Gait shows normal stride length and normal pace. No problems turning are noted. Tandem walk is unremarkable.   Assessment and Plan:  In summary, Casey [name] is a very  pleasant 53 year old male with an underlying medical history of hypertension, eczema, prediabetes, prior smoking, kidney stones, and obesity, who Presents for follow-up consultation of his obstructive sleep apnea which was in the moderate range by  baseline sleep testing on 08/02/2018.  He has established treatment at home on AutoPap.  He is completely compliant with treatment and indicates good results, apnea scores at goal, leak on the low side, he uses newer generation fullface mask.  He is so far quite pleased with his outcome.  He indicates improved sleep quality and better sleep consolidation, less daytime somnolence.  He tracks his sleep on a Fitbit tracker and also has downloaded the app that goes with his AutoPap machine.  He is working on weight loss.  He is advised to continue with the current treatment settings and commended for his treatment adherence.  He is advised to follow-up routinely in 6 months with a nurse practitioner and then yearly thereafter hopefully.  He is motivated to continue with treatment.  I answered all his questions today and he was in agreement. I spent 25 minutes in total face-to-face time with the patient, more than 50% of which was spent in counseling and coordination of care, reviewing test results, reviewing medication and discussing or reviewing the diagnosis of OSA, its prognosis and treatment options. Pertinent laboratory and imaging test results that were available during this visit with the patient were reviewed by me and considered in my medical decision making (see chart for details).

## 2018-10-17 NOTE — Patient Instructions (Signed)
Please continue using your autoPAP regularly. While your insurance requires that you use PAP at least 4 hours each night on 70% of the nights, I recommend, that you not skip any nights and use it throughout the night if you can. Getting used to PAP and staying with the treatment long term does take time and patience and discipline. Untreated obstructive sleep apnea when it is moderate to severe can have an adverse impact on cardiovascular health and raise her risk for heart disease, arrhythmias, hypertension, congestive heart failure, stroke and diabetes. Untreated obstructive sleep apnea causes sleep disruption, nonrestorative sleep, and sleep deprivation. This can have an impact on your day to day functioning and cause daytime sleepiness and impairment of cognitive function, memory loss, mood disturbance, and problems focussing. Using PAP regularly can improve these symptoms.  Keep up the good work! We can see you in 6 months, you can see one of our nurse practitioners as you are stable.  Please work on weight loss, as it can also improve your sleep apnea.

## 2018-11-11 DIAGNOSIS — G4733 Obstructive sleep apnea (adult) (pediatric): Secondary | ICD-10-CM | POA: Diagnosis not present

## 2018-11-25 ENCOUNTER — Ambulatory Visit (INDEPENDENT_AMBULATORY_CARE_PROVIDER_SITE_OTHER): Payer: BC Managed Care – PPO | Admitting: Registered Nurse

## 2018-11-25 ENCOUNTER — Encounter: Payer: Self-pay | Admitting: Registered Nurse

## 2018-11-25 ENCOUNTER — Other Ambulatory Visit: Payer: Self-pay

## 2018-11-25 VITALS — BP 119/75 | HR 86 | Temp 98.6°F | Resp 16 | Wt 258.0 lb

## 2018-11-25 DIAGNOSIS — H9192 Unspecified hearing loss, left ear: Secondary | ICD-10-CM | POA: Diagnosis not present

## 2018-11-25 NOTE — Patient Instructions (Signed)
° ° ° °  If you have lab work done today you will be contacted with your lab results within the next 2 weeks.  If you have not heard from us then please contact us. The fastest way to get your results is to register for My Chart. ° ° °IF you received an x-ray today, you will receive an invoice from [facility]. Please contact [facility] at [phone] with questions or concerns regarding your invoice.  ° °IF you received labwork today, you will receive an invoice from [facility]. Please contact [facility] at [phone] with questions or concerns regarding your invoice.  ° °Our billing staff will not be able to assist you with questions regarding bills from these companies. ° °You will be contacted with the lab results as soon as they are available. The fastest way to get your results is to activate your My Chart account. Instructions are located on the last page of this paperwork. If you have not heard from us regarding the results in 2 weeks, please contact this office. °  ° ° ° °

## 2018-11-25 NOTE — Progress Notes (Signed)
Established Patient Office Visit  Subjective:  Patient ID: Casey [name], male    DOB: August 22, 1965  Age: 53 y.o. MRN: [id number]  CC:  Chief Complaint  Patient presents with  . Ear Problem    pt would like to check his ears because he noticed he has been having hearing issues     HPI Casey [name] presents for concerns related to his hearing  He notes that this has been ongoing for about 5-6 years, but has been noticeably worse during the pandemic - he thinks he may have difficulty understanding people with masks, but he has also had a friend notice that he has to have the volume on the TV higher than other individuals.   Denies any history of ear trauma or consistent exposure to loud noises. Denies tinnitus. States he has had impacted cerumen in the past, but does not think this is the case. Does not suggest lateralization of his hearing. Does not get frequent otitis or URIs. No autoimmune disorders that he is aware of.   Past Medical History:  Diagnosis Date  . History of kidney stones   . Hyperglycemia 09/07/2015  . Hypertension   . SINUSITIS, ACUTE 03/08/2008   Qualifier: Diagnosis of  By: Amil Amen MD, Benjamine Mola      Past Surgical History:  Procedure Laterality Date  . APPENDECTOMY  2005  . EXTRACORPOREAL SHOCK WAVE LITHOTRIPSY Left 01/27/2018   Procedure: EXTRACORPOREAL SHOCK WAVE LITHOTRIPSY (ESWL);  Surgeon: Bjorn Loser, MD;  Location: [facility];  Service: Urology;  Laterality: Left;  . WISDOM TOOTH EXTRACTION      Family History  Problem Relation Age of Onset  . Stroke Mother   . Hypertension Father   . Bladder Cancer Father   . Colon cancer Neg Hx     Social History   Socioeconomic History  . Marital status: Single    Spouse name: Not on file  . Number of children: Not on file  . Years of education: Not on file  . Highest education level: Not on file  Occupational History  . Not on file  Social Needs  . Financial resource strain: Not on file  . Food  insecurity    Worry: Not on file    Inability: Not on file  . Transportation needs    Medical: Not on file    Non-medical: Not on file  Tobacco Use  . Smoking status: Former Smoker    Packs/day: 1.50    Years: 35.00    Pack years: 52.50    Quit date: 11/24/2015    Years since quitting: 3.0  . Smokeless tobacco: Never Used  Substance and Sexual Activity  . Alcohol use: No    Alcohol/week: 0.0 standard drinks    Frequency: Never    Comment: rare   . Drug use: No  . Sexual activity: Not Currently  Lifestyle  . Physical activity    Days per week: Not on file    Minutes per session: Not on file  . Stress: Not on file  Relationships  . Social Herbalist on phone: Not on file    Gets together: Not on file    Attends religious service: Not on file    Active member of club or organization: Not on file    Attends meetings of clubs or organizations: Not on file    Relationship status: Not on file  . Intimate partner violence    Fear of current or ex partner: Not on  file    Emotionally abused: Not on file    Physically abused: Not on file    Forced sexual activity: Not on file  Other Topics Concern  . Not on file  Social History Narrative  . Not on file    Outpatient Medications Prior to Visit  Medication Sig Dispense Refill  . lisinopril (PRINIVIL,ZESTRIL) 20 MG tablet Take 1 tablet (20 mg total) by mouth daily. 90 tablet 3  . betamethasone dipropionate (DIPROLENE) 0.05 % cream Apply topically 2 (two) times daily. 30 g 2   Facility-Administered Medications Prior to Visit  Medication Dose Route Frequency Provider Last Rate Last Dose  . 0.9 %  sodium chloride infusion  500 mL Intravenous Once Armbruster, Carlota Raspberry, MD        Allergies  Allergen Reactions  . Fish Allergy Rash  . Penicillins Rash    REACTION: had reaction as an infant--cannot recall reaction    ROS Review of Systems Per hpi    Objective:    Physical Exam  Constitutional: He is oriented to  person, place, and time. He appears well-developed and well-nourished. No distress.  HENT:  Head: Normocephalic.  Right Ear: Tympanic membrane, external ear and ear canal normal. No drainage, swelling or tenderness. No foreign bodies. Tympanic membrane is not perforated. No middle ear effusion. No decreased hearing is noted.  Left Ear: Tympanic membrane, external ear and ear canal normal. No drainage, swelling or tenderness. No foreign bodies. Tympanic membrane is not perforated.  No middle ear effusion. Decreased hearing is noted.  Cardiovascular: Normal rate and regular rhythm.  Pulmonary/Chest: Effort normal. No respiratory distress.  Neurological: He is alert and oriented to person, place, and time.  Skin: Skin is warm and dry. No rash noted. He is not diaphoretic. No erythema. No pallor.  Psychiatric: He has a normal mood and affect. His behavior is normal. Judgment and thought content normal.  Nursing note and vitals reviewed. Air conduction greater than bone conduction bilaterally via Rinne test Weber test reveals lateralization favoring R ear  BP 119/75   Pulse 86   Temp 98.6 F (37 C) (Oral)   Resp 16   Wt 258 lb (117 kg)   SpO2 98%   BMI 33.13 kg/m  Wt Readings from Last 3 Encounters:  11/25/18 258 lb (117 kg)  10/17/18 253 lb (114.8 kg)  09/19/18 260 lb (117.9 kg)     Health Maintenance Due  Topic Date Due  . HIV Screening  06/22/1980    There are no preventive care reminders to display for this patient.  Lab Results  Component Value Date   TSH 1.880 12/23/2017   Lab Results  Component Value Date   WBC 5.5 12/23/2017   HGB 15.9 12/23/2017   HCT 45.0 12/23/2017   MCV 92 12/23/2017   PLT 289 12/23/2017   Lab Results  Component Value Date   NA 137 12/23/2017   K 4.1 12/23/2017   CO2 20 12/23/2017   GLUCOSE 98 12/23/2017   BUN 13 12/23/2017   CREATININE 0.81 12/23/2017   BILITOT 1.3 (H) 12/23/2017   ALKPHOS 63 12/23/2017   AST 58 (H) 12/23/2017   ALT  58 (H) 12/23/2017   PROT 7.2 12/23/2017   ALBUMIN 4.4 12/23/2017   CALCIUM 9.6 12/23/2017   Lab Results  Component Value Date   CHOL 132 12/23/2017   Lab Results  Component Value Date   HDL 27 (L) 12/23/2017   Lab Results  Component Value Date  Paden 70 12/23/2017   Lab Results  Component Value Date   TRIG 173 (H) 12/23/2017   Lab Results  Component Value Date   CHOLHDL 4.9 12/23/2017   Lab Results  Component Value Date   HGBA1C 5.7 (H) 12/23/2017      Assessment & Plan:   Problem List Items Addressed This Visit      Nervous and Auditory   Hearing decreased, left - Primary   Relevant Orders   Ambulatory referral to Audiology      No orders of the defined types were placed in this encounter.   Follow-up: Return if symptoms worsen or fail to improve.   PLAN  Refer to audiology. This lateralization of hearing may be benign, but this patient would feel more comfortable with a specialist workup.   No other concerns at this time  Patient encouraged to call clinic with any questions, comments, or concerns.   Maximiano Coss, NP

## 2018-12-07 ENCOUNTER — Other Ambulatory Visit: Payer: Self-pay | Admitting: Physician Assistant

## 2018-12-07 DIAGNOSIS — I10 Essential (primary) hypertension: Secondary | ICD-10-CM

## 2018-12-13 ENCOUNTER — Encounter: Payer: Self-pay | Admitting: Registered Nurse

## 2018-12-27 DIAGNOSIS — H93293 Other abnormal auditory perceptions, bilateral: Secondary | ICD-10-CM | POA: Diagnosis not present

## 2019-01-13 DIAGNOSIS — G4733 Obstructive sleep apnea (adult) (pediatric): Secondary | ICD-10-CM | POA: Diagnosis not present

## 2019-02-16 DIAGNOSIS — G4733 Obstructive sleep apnea (adult) (pediatric): Secondary | ICD-10-CM | POA: Diagnosis not present

## 2019-04-04 DIAGNOSIS — D414 Neoplasm of uncertain behavior of bladder: Secondary | ICD-10-CM | POA: Diagnosis not present

## 2019-04-04 DIAGNOSIS — N200 Calculus of kidney: Secondary | ICD-10-CM | POA: Diagnosis not present

## 2019-04-19 ENCOUNTER — Encounter: Payer: Self-pay | Admitting: Family Medicine

## 2019-04-19 ENCOUNTER — Ambulatory Visit: Payer: BC Managed Care – PPO | Admitting: Family Medicine

## 2019-04-19 ENCOUNTER — Other Ambulatory Visit: Payer: Self-pay

## 2019-04-19 VITALS — BP 117/77 | HR 84 | Temp 97.1°F | Ht 72.0 in | Wt 261.0 lb

## 2019-04-19 DIAGNOSIS — G4733 Obstructive sleep apnea (adult) (pediatric): Secondary | ICD-10-CM

## 2019-04-19 DIAGNOSIS — Z9989 Dependence on other enabling machines and devices: Secondary | ICD-10-CM | POA: Diagnosis not present

## 2019-04-19 NOTE — Patient Instructions (Signed)
Please continue using your CPAP regularly. While your insurance requires that you use CPAP at least 4 hours each night on 70% of the nights, I recommend, that you not skip any nights and use it throughout the night if you can. Getting used to CPAP and staying with the treatment long term does take time and patience and discipline. Untreated obstructive sleep apnea when it is moderate to severe can have an adverse impact on cardiovascular health and raise her risk for heart disease, arrhythmias, hypertension, congestive heart failure, stroke and diabetes. Untreated obstructive sleep apnea causes sleep disruption, nonrestorative sleep, and sleep deprivation. This can have an impact on your day to day functioning and cause daytime sleepiness and impairment of cognitive function, memory loss, mood disturbance, and problems focussing. Using CPAP regularly can improve these symptoms.   Follow up in 1 year, sooner if needed   Sleep Apnea Sleep apnea affects breathing during sleep. It causes breathing to stop for a short time or to become shallow. It can also increase the risk of:  Heart attack.  Stroke.  Being very overweight (obese).  Diabetes.  Heart failure.  Irregular heartbeat. The goal of treatment is to help you breathe normally again. What are the causes? There are three kinds of sleep apnea:  Obstructive sleep apnea. This is caused by a blocked or collapsed airway.  Central sleep apnea. This happens when the brain does not send the right signals to the muscles that control breathing.  Mixed sleep apnea. This is a combination of obstructive and central sleep apnea. The most common cause of this condition is a collapsed or blocked airway. This can happen if:  Your throat muscles are too relaxed.  Your tongue and tonsils are too large.  You are overweight.  Your airway is too small. What increases the risk?  Being overweight.  Smoking.  Having a small airway.  Being  older.  Being male.  Drinking alcohol.  Taking medicines to calm yourself (sedatives or tranquilizers).  Having family members with the condition. What are the signs or symptoms?  Trouble staying asleep.  Being sleepy or tired during the day.  Getting angry a lot.  Loud snoring.  Headaches in the morning.  Not being able to focus your mind (concentrate).  Forgetting things.  Less interest in sex.  Mood swings.  Personality changes.  Feelings of sadness (depression).  Waking up a lot during the night to pee (urinate).  Dry mouth.  Sore throat. How is this diagnosed?  Your medical history.  A physical exam.  A test that is done when you are sleeping (sleep study). The test is most often done in a sleep lab but may also be done at home. How is this treated?   Sleeping on your side.  Using a medicine to get rid of mucus in your nose (decongestant).  Avoiding the use of alcohol, medicines to help you relax, or certain pain medicines (narcotics).  Losing weight, if needed.  Changing your diet.  Not smoking.  Using a machine to open your airway while you sleep, such as: ? An oral appliance. This is a mouthpiece that shifts your lower jaw forward. ? A CPAP device. This device blows air through a mask when you breathe out (exhale). ? An EPAP device. This has valves that you put in each nostril. ? A BPAP device. This device blows air through a mask when you breathe in (inhale) and breathe out.  Having surgery if other treatments do not  work. It is important to get treatment for sleep apnea. Without treatment, it can lead to:  High blood pressure.  Coronary artery disease.  In men, not being able to have an erection (impotence).  Reduced thinking ability. Follow these instructions at home: Lifestyle  Make changes that your doctor recommends.  Eat a healthy diet.  Lose weight if needed.  Avoid alcohol, medicines to help you relax, and some pain  medicines.  Do not use any products that contain nicotine or tobacco, such as cigarettes, e-cigarettes, and chewing tobacco. If you need help quitting, ask your doctor. General instructions  Take over-the-counter and prescription medicines only as told by your doctor.  If you were given a machine to use while you sleep, use it only as told by your doctor.  If you are having surgery, make sure to tell your doctor you have sleep apnea. You may need to bring your device with you.  Keep all follow-up visits as told by your doctor. This is important. Contact a doctor if:  The machine that you were given to use during sleep bothers you or does not seem to be working.  You do not get better.  You get worse. Get help right away if:  Your chest hurts.  You have trouble breathing in enough air.  You have an uncomfortable feeling in your back, arms, or stomach.  You have trouble talking.  One side of your body feels weak.  A part of your face is hanging down. These symptoms may be an emergency. Do not wait to see if the symptoms will go away. Get medical help right away. Call your local emergency services (911 in the [place] Do not drive yourself to the hospital. Summary  This condition affects breathing during sleep.  The most common cause is a collapsed or blocked airway.  The goal of treatment is to help you breathe normally while you sleep. This information is not intended to replace advice given to you by your health care provider. Make sure you discuss any questions you have with your health care provider. Document Revised: 11/26/2017 Document Reviewed: 10/05/2017 Elsevier Patient Education  [place].

## 2019-04-19 NOTE — Progress Notes (Addendum)
PATIENT: Casey [name] DOB: Jul 05, 1965  REASON FOR VISIT: follow up HISTORY FROM: patient  Chief Complaint  Patient presents with  . Follow-up    Tx RM. Alone. states that he is trying to get use to the CPAP machine. States he is sleeping better.     HISTORY OF PRESENT ILLNESS: Today 04/19/19 Casey [name] is a 54 y.o. male here today for follow up for OSA on CPAP.  He continues to adjust to therapy.  Overall, he is doing quite well.  He does admit to pulling his mask off at some point during the night.  He had oral surgery at the beginning of the month that prevented him from using CPAP for about 3 days.  He does note significant improvement in daytime energy.  He no longer feels that he needs to lay down and nap when he gets home from work.  Fatigue levels remain elevated.  He admits that lifestyle changes would most likely help.  Compliance report dated 03/19/2019 through 04/17/2019 reveals that he used CPAP 27 of the last 30 days for compliance of 90%.  25 of the last 30 days he used CPAP greater than 4 hours for compliance of 83%.  Average usage was 5 hours and 54 minutes.  Residual AHI was 0.3 on 7 to 1310 cm of water and an EPR of 3.  There was no significant leak noted.   HISTORY: (copied from Dr Casey [name] note on 10/17/2018)  Casey [name] is a 54 year old right-handed gentleman with an underlying medical history of hypertension, eczema, prediabetes, prior smoking, kidney stones, and obesity, who presents for follow-up consultation of his obstructive sleep apnea, after interim sleep study testing and starting AutoPap therapy.  The patient is unaccompanied today.  I first met him on 01/25/2018, at the request of his primary care provider, at which time he reported snoring, daytime somnolence and witnessed apneas.  He was advised to proceed with a sleep study.  He had a baseline sleep study on 08/02/2018.  I went over his test results with him in detail today.  Sleep efficiency was 64.7%, sleep  latency 57 minutes which is delayed, REM latency mildly delayed at 141.5 minutes.  He had an increased percentage of stage II sleep, REM sleep was markedly reduced at 5.4%.  Total AHI was in the moderate range at 26.6/h, REM AHI 47.1/h, supine AHI 80.9/h, average oxygen saturation 96%, nadir was 86%.  He had no significant PLM's.  He had occasional PVCs.  He was advised to start with AutoPap therapy given that we were in the midst of the COVID-19 pandemic.  Today, 10/17/2018: I reviewed his AutoPap compliance data from 09/13/2018 through 10/12/2018 which is a total of 30 days, during which time he used his AutoPap 29 days with percent used days greater than 4 hours at 97%, indicating excellent compliance with an average usage of 6 hours and 46 minutes, residual AHI at goal at 0.7/h, average pressure for the 95th percentile at 12.1 cm, range of 7 cm to 13 cm, average leak low with the 95th percentile at 5.3 L/min.  Set up date was 08/11/2018.  He missed today because of power outage.  He reports still adjusting to treatment, otherwise he is doing well, he has noticed improvement in his sleep quality, more noticeable in the first couple of weeks.  He does not feel the need to take a nap in the afternoons.  He is working on weight loss.  He tries to hydrate well  with water.  He works in the office, as a Government social research officer.  He reports having seen Dr. Einar Gip in the interim.  He some interim testing which was largely benign per patient, he was told that he had a bundle branch block.  The patient's allergies, current medications, family history, past medical history, past social history, past surgical history and problem list were reviewed and updated as appropriate.   Previously:  01/25/2018: (He) reports snoring and excessive daytime somnolence as well as witnessed apneas. I reviewed your office note from 12/23/2017. His Epworth sleepiness score is 15 out of 24 today, fatigue score is 46 out of 63. He has woken up with  a sense of gasping for air. He is single and lives alone, he quit smoking in July 2017, drinks alcohol rarely, drinks caffeine daily in the form of coffee and sodas, about 3-4 servings per day on average. His bedtime is around 11:30 PM and rise time around 7. He has nocturia, typically about once per average night, he has had morning headaches and has been told that he pauses in his snoring while asleep. He has a history of left shoulder discomfort, he had an EKG recently which showed right bundle branch block and saw Dr. Einar Gip in cardiology, had further workup which was benign per patient report. He is scheduled for lithotripsy in 2 days. He has no pets, no children, has a TV in the bedroom and puts it on a timer typically. He works as a Government social research officer. He is not aware of any family history of OSA. He is familiar with the diagnosis as his stepfather has sleep apnea and uses a CPAP machine. Patient would be willing to consider CPAP therapy. His weight has been fluctuating.   REVIEW OF SYSTEMS: Out of a complete 14 system review of symptoms, the patient complains only of the following symptoms, none and all other reviewed systems are negative.  Epworth Sleepiness Scale: 4 Fatigue severity scale: 42  ALLERGIES: Allergies  Allergen Reactions  . Fish Allergy Rash  . Penicillins Rash    REACTION: had reaction as an infant--cannot recall reaction    HOME MEDICATIONS: Outpatient Medications Prior to Visit  Medication Sig Dispense Refill  . lisinopril (ZESTRIL) 20 MG tablet TAKE 1 TABLET(20 MG) BY MOUTH DAILY 90 tablet 3   Facility-Administered Medications Prior to Visit  Medication Dose Route Frequency Provider Last Rate Last Admin  . 0.9 %  sodium chloride infusion  500 mL Intravenous Once Armbruster, Carlota Raspberry, MD        PAST MEDICAL HISTORY: Past Medical History:  Diagnosis Date  . History of kidney stones   . Hyperglycemia 09/07/2015  . Hypertension   . SINUSITIS, ACUTE 03/08/2008    Qualifier: Diagnosis of  By: Amil Amen MD, Ervin Knack SURGICAL HISTORY: Past Surgical History:  Procedure Laterality Date  . APPENDECTOMY  2005  . EXTRACORPOREAL SHOCK WAVE LITHOTRIPSY Left 01/27/2018   Procedure: EXTRACORPOREAL SHOCK WAVE LITHOTRIPSY (ESWL);  Surgeon: Bjorn Loser, MD;  Location: [facility];  Service: Urology;  Laterality: Left;  . WISDOM TOOTH EXTRACTION      FAMILY HISTORY: Family History  Problem Relation Age of Onset  . Stroke Mother   . Hypertension Father   . Bladder Cancer Father   . Colon cancer Neg Hx     SOCIAL HISTORY: Social History   Socioeconomic History  . Marital status: Single    Spouse name: Not on file  . Number of children: Not  on file  . Years of education: Not on file  . Highest education level: Not on file  Occupational History  . Not on file  Tobacco Use  . Smoking status: Former Smoker    Packs/day: 1.50    Years: 35.00    Pack years: 52.50    Quit date: 11/24/2015    Years since quitting: 3.4  . Smokeless tobacco: Never Used  Substance and Sexual Activity  . Alcohol use: No    Alcohol/week: 0.0 standard drinks    Comment: rare   . Drug use: No  . Sexual activity: Not Currently  Other Topics Concern  . Not on file  Social History Narrative  . Not on file   Social Determinants of Health   Financial Resource Strain:   . Difficulty of Paying Living Expenses: Not on file  Food Insecurity:   . Worried About Charity fundraiser in the Last Year: Not on file  . Ran Out of Food in the Last Year: Not on file  Transportation Needs:   . Lack of Transportation (Medical): Not on file  . Lack of Transportation (Non-Medical): Not on file  Physical Activity:   . Days of Exercise per Week: Not on file  . Minutes of Exercise per Session: Not on file  Stress:   . Feeling of Stress : Not on file  Social Connections:   . Frequency of Communication with Friends and Family: Not on file  . Frequency of Social Gatherings  with Friends and Family: Not on file  . Attends Religious Services: Not on file  . Active Member of Clubs or Organizations: Not on file  . Attends Archivist Meetings: Not on file  . Marital Status: Not on file  Intimate Partner Violence:   . Fear of Current or Ex-Partner: Not on file  . Emotionally Abused: Not on file  . Physically Abused: Not on file  . Sexually Abused: Not on file      PHYSICAL EXAM  Vitals:   04/19/19 0750  BP: 117/77  Pulse: 84  Temp: (!) 97.1 F (36.2 C)  Weight: 261 lb (118.4 kg)  Height: 6' (1.829 m)   Body mass index is 35.4 kg/m.  Generalized: Well developed, in no acute distress  Cardiology: normal rate and rhythm, no murmur noted Respiratory: Clear to auscultation bilaterally Neurological examination  Mentation: Alert oriented to time, place, history taking. Follows all commands speech and language fluent Cranial nerve II-XII: Pupils were equal round reactive to light. Extraocular movements were full, visual field were full  Motor: The motor testing reveals 5 over 5 strength of all 4 extremities. Good symmetric motor tone is noted throughout.  Gait and station: Gait is normal.   DIAGNOSTIC DATA (LABS, IMAGING, TESTING) - I reviewed patient records, labs, notes, testing and imaging myself where available.  No flowsheet data found.   Lab Results  Component Value Date   WBC 5.5 12/23/2017   HGB 15.9 12/23/2017   HCT 45.0 12/23/2017   MCV 92 12/23/2017   PLT 289 12/23/2017      Component Value Date/Time   NA 137 12/23/2017 1625   K 4.1 12/23/2017 1625   CL 102 12/23/2017 1625   CO2 20 12/23/2017 1625   GLUCOSE 98 12/23/2017 1625   GLUCOSE 124 (H) 10/04/2015 0917   BUN 13 12/23/2017 1625   CREATININE 0.81 12/23/2017 1625   CREATININE 0.91 10/04/2015 0917   CALCIUM 9.6 12/23/2017 1625   PROT 7.2 12/23/2017 1625  ALBUMIN 4.4 12/23/2017 1625   AST 58 (H) 12/23/2017 1625   ALT 58 (H) 12/23/2017 1625   ALKPHOS 63  12/23/2017 1625   BILITOT 1.3 (H) 12/23/2017 1625   GFRNONAA 102 12/23/2017 1625   GFRNONAA >89 10/04/2015 0917   GFRAA 118 12/23/2017 1625   GFRAA >89 10/04/2015 0917   Lab Results  Component Value Date   CHOL 132 12/23/2017   HDL 27 (L) 12/23/2017   LDLCALC 70 12/23/2017   TRIG 173 (H) 12/23/2017   CHOLHDL 4.9 12/23/2017   Lab Results  Component Value Date   HGBA1C 5.7 (H) 12/23/2017   No results found for: VITAMINB12 Lab Results  Component Value Date   TSH 1.880 12/23/2017       ASSESSMENT AND PLAN 54 y.o. year old male  has a past medical history of History of kidney stones, Hyperglycemia (09/07/2015), Hypertension, and SINUSITIS, ACUTE (03/08/2008). here with     ICD-10-CM   1. OSA on CPAP  G47.33 For home use only DME continuous positive airway pressure (CPAP)   Z99.89     She is doing well with CPAP therapy at home.  Compliance report reveals optimal compliance.  He was encouraged to continue using CPAP nightly and for greater than 4 hours each night.  We have reviewed risk of untreated sleep apnea.  We will send orders today for supplies.  He will continue to work on healthy lifestyle changes with well-balanced diet, regular exercise and adequate hydration.  He will follow-up with [place] in 1 year, sooner if needed.  He verbalizes understanding and agreement with this plan.   Orders Placed This Encounter  Procedures  . For home use only DME continuous positive airway pressure (CPAP)    Mask refitting    Order Specific Question:   Length of Need    Answer:   Lifetime    Order Specific Question:   Patient has OSA or probable OSA    Answer:   Yes    Order Specific Question:   Is the patient currently using CPAP in the home    Answer:   Yes    Order Specific Question:   Settings    Answer:   Other see comments    Order Specific Question:   CPAP supplies needed    Answer:   Mask, headgear, cushions, filters, heated tubing and water chamber     No orders of the  defined types were placed in this encounter.     I spent 15 minutes with the patient. 50% of this time was spent counseling and educating patient on plan of care and medications.    Debbora Presto, FNP-C 04/19/2019, 8:06 AM [facility] [address] [place], [place] [address] [phone]  I reviewed the above note and documentation by the Nurse Practitioner and agree with the history, exam, assessment and plan as outlined above. I was available for consultation. Star Age, MD, PhD [facility])

## 2019-05-25 ENCOUNTER — Telehealth: Payer: Self-pay | Admitting: Registered Nurse

## 2019-05-25 DIAGNOSIS — I10 Essential (primary) hypertension: Secondary | ICD-10-CM

## 2019-05-25 MED ORDER — LISINOPRIL 20 MG PO TABS: ORAL_TABLET | ORAL | 0 refills | Status: DC

## 2019-05-25 NOTE — Telephone Encounter (Signed)
Requested medication (s) are due for refill today: yes  Requested medication (s) are on the active medication list: yes  Last refill:  12/07/2018  Pharmacy states they never received request  Future visit scheduled:No  Notes to clinic:  Patient labs due. Rx sent 12/07/18 was never received by pharmacy. Patient needs appointment   Requested Prescriptions  Pending Prescriptions Disp Refills   lisinopril (ZESTRIL) 20 MG tablet 90 tablet 3    Sig: TAKE 1 TABLET(20 MG) BY MOUTH DAILY      Cardiovascular:  ACE Inhibitors Failed - 05/25/2019  9:51 AM      Failed - Cr in normal range and within 180 days    Creat  Date Value Ref Range Status  10/04/2015 0.91 0.70 - 1.33 mg/dL Final    Comment:      For patients > or = 54 years of age: The upper reference limit for Creatinine is approximately 13% higher for people identified as African-American.      Creatinine, Ser  Date Value Ref Range Status  12/23/2017 0.81 0.76 - 1.27 mg/dL Final          Failed - K in normal range and within 180 days    Potassium  Date Value Ref Range Status  12/23/2017 4.1 3.5 - 5.2 mmol/L Final          Failed - Valid encounter within last 6 months    Recent Outpatient Visits           6 months ago Hearing decreased, left   [facility], NP   8 months ago Pain in both knees, unspecified chronicity   [facility], NP   9 months ago Abnormal findings on diagnostic imaging of lung   [facility], Delfino Lovett, NP   1 year ago Essential hypertension   [facility], [place] D, PA-C   1 year ago Left shoulder pain, unspecified chronicity   [facility], Audrie Lia, PA-C              Passed - Patient is not pregnant      Passed - Last BP in normal range    BP Readings from Last 1 Encounters:  04/19/19 117/77

## 2019-05-25 NOTE — Telephone Encounter (Signed)
Medication Refill - Medication:  lisinopril (ZESTRIL) 20 MG tablet    Has the patient contacted their pharmacy?  Yes advised to call office. Pharmacy states they never received script from October 2020. Please advise as patient is out   Preferred Pharmacy (with phone number or street name):  [facility] [phone] - [name], [name] Phone:  [phone]  Fax:  [phone]       Agent: Please be advised that RX refills may take up to 3 business days. We ask that you follow-up with your pharmacy.

## 2019-08-18 ENCOUNTER — Ambulatory Visit: Payer: BC Managed Care – PPO | Admitting: Registered Nurse

## 2019-08-18 ENCOUNTER — Encounter: Payer: Self-pay | Admitting: Registered Nurse

## 2019-08-18 ENCOUNTER — Other Ambulatory Visit: Payer: Self-pay

## 2019-08-18 VITALS — BP 115/79 | HR 77 | Temp 97.9°F | Resp 17 | Ht 72.0 in | Wt 257.0 lb

## 2019-08-18 DIAGNOSIS — I10 Essential (primary) hypertension: Secondary | ICD-10-CM | POA: Diagnosis not present

## 2019-08-18 DIAGNOSIS — L2084 Intrinsic (allergic) eczema: Secondary | ICD-10-CM

## 2019-08-18 DIAGNOSIS — K219 Gastro-esophageal reflux disease without esophagitis: Secondary | ICD-10-CM | POA: Diagnosis not present

## 2019-08-18 MED ORDER — LISINOPRIL 20 MG PO TABS: ORAL_TABLET | ORAL | 1 refills | Status: DC

## 2019-08-18 MED ORDER — BETAMETHASONE DIPROPIONATE 0.05 % EX CREA: TOPICAL_CREAM | Freq: Two times a day (BID) | CUTANEOUS | 2 refills | Status: DC

## 2019-08-18 NOTE — Progress Notes (Signed)
Established Patient Office Visit  Subjective:  Patient ID: Casey [name], male    DOB: [birth year]  Age: 54 y.o. MRN: [id number]  CC:  Chief Complaint  Patient presents with  . GI Problem    Patient would like to discuss previous Stomach issues, and Teeth Problems   . Medication Refill    on medications that are pended     HPI [name] presents for refills and GERD  Refills: Needs betamethasone for rash, and lisinopril 55m. Taking lisinopril for hx of HTN. Well controlled. No AEs, hoping to continue. Uses betamethasone episodically with good effect. Does not use on face or groin.   GERD: has been a longstanding issue for him that he now has under control with a combination of OTCs including Pepcid. He does notice that 1-3 times per month he feels that food gets stuck in central chest, very painful. Needs to stand upright and be still to resolve this. No nausea or vomiting. Denies stool changes. Does note that despite regular dental hygiene, he still has a number of dental concerns including needing implants, something his insurance denied. Unfortunately, his mother had had similar symptoms that had led to her having GERD related dental changes.  Otherwise feeling well.   Past Medical History:  Diagnosis Date  . History of kidney stones   . Hyperglycemia 09/07/2015  . Hypertension   . SINUSITIS, ACUTE 03/08/2008   Qualifier: Diagnosis of  By: MAmil AmenMD, EBenjamine Mola     Past Surgical History:  Procedure Laterality Date  . APPENDECTOMY  2005  . EXTRACORPOREAL SHOCK WAVE LITHOTRIPSY Left 01/27/2018   Procedure: EXTRACORPOREAL SHOCK WAVE LITHOTRIPSY (ESWL);  Surgeon: MBjorn Loser MD;  Location: [facility];  Service: Urology;  Laterality: Left;  . WISDOM TOOTH EXTRACTION      Family History  Problem Relation Age of Onset  . Stroke Mother   . Hypertension Father   . Bladder Cancer Father   . Colon cancer Neg Hx     Social History   Socioeconomic History  . Marital  status: Single    Spouse name: Not on file  . Number of children: Not on file  . Years of education: Not on file  . Highest education level: Not on file  Occupational History  . Not on file  Tobacco Use  . Smoking status: Former Smoker    Packs/day: 1.50    Years: 35.00    Pack years: 52.50    Quit date: 11/24/2015    Years since quitting: 3.7  . Smokeless tobacco: Never Used  Vaping Use  . Vaping Use: Never used  Substance and Sexual Activity  . Alcohol use: No    Alcohol/week: 0.0 standard drinks    Comment: rare   . Drug use: No  . Sexual activity: Not Currently  Other Topics Concern  . Not on file  Social History Narrative  . Not on file   Social Determinants of Health   Financial Resource Strain:   . Difficulty of Paying Living Expenses:   Food Insecurity:   . Worried About RCharity fundraiserin the Last Year:   . RArboriculturistin the Last Year:   Transportation Needs:   . LFilm/video editor(Medical):   .Marland KitchenLack of Transportation (Non-Medical):   Physical Activity:   . Days of Exercise per Week:   . Minutes of Exercise per Session:   Stress:   . Feeling of Stress :   Social Connections:   .  Frequency of Communication with Friends and Family:   . Frequency of Social Gatherings with Friends and Family:   . Attends Religious Services:   . Active Member of Clubs or Organizations:   . Attends Archivist Meetings:   Marland Kitchen Marital Status:   Intimate Partner Violence:   . Fear of Current or Ex-Partner:   . Emotionally Abused:   Marland Kitchen Physically Abused:   . Sexually Abused:     Outpatient Medications Prior to Visit  Medication Sig Dispense Refill  . lisinopril (ZESTRIL) 20 MG tablet TAKE 1 TABLET(20 MG) BY MOUTH DAILY 90 tablet 0   Facility-Administered Medications Prior to Visit  Medication Dose Route Frequency Provider Last Rate Last Admin  . 0.9 %  sodium chloride infusion  500 mL Intravenous Once Armbruster, Carlota Raspberry, MD        Allergies    Allergen Reactions  . Fish Allergy Rash  . Penicillins Rash    REACTION: had reaction as an infant--cannot recall reaction    ROS Review of Systems  Constitutional: Negative.   HENT: Negative.   Eyes: Negative.   Respiratory: Negative.   Cardiovascular: Negative.   Gastrointestinal: Positive for abdominal pain. Negative for abdominal distention, anal bleeding, blood in stool, constipation, diarrhea, nausea, rectal pain and vomiting.  Endocrine: Negative.   Genitourinary: Negative.   Musculoskeletal: Negative.   Skin: Negative.   Allergic/Immunologic: Negative.   Neurological: Negative.   Hematological: Negative.   Psychiatric/Behavioral: Negative.   All other systems reviewed and are negative.     Objective:    Physical Exam Vitals and nursing note reviewed.  Constitutional:      General: He is not in acute distress.    Appearance: Normal appearance. He is obese. He is not ill-appearing, toxic-appearing or diaphoretic.  Cardiovascular:     Rate and Rhythm: Normal rate and regular rhythm.  Pulmonary:     Effort: Pulmonary effort is normal. No respiratory distress.  Abdominal:     General: Abdomen is flat. Bowel sounds are normal.     Tenderness: There is no abdominal tenderness.  Skin:    General: Skin is warm and dry.     Coloration: Skin is not jaundiced or pale.     Findings: No bruising, erythema, lesion or rash.  Neurological:     General: No focal deficit present.     Mental Status: He is alert and oriented to person, place, and time. Mental status is at baseline.  Psychiatric:        Mood and Affect: Mood normal.        Behavior: Behavior normal.        Thought Content: Thought content normal.        Judgment: Judgment normal.     BP 115/79   Pulse 77   Temp 97.9 F (36.6 C) (Temporal)   Resp 17   Ht 6' (1.829 m)   Wt 257 lb (116.6 kg)   SpO2 95%   BMI 34.86 kg/m  Wt Readings from Last 3 Encounters:  08/18/19 257 lb (116.6 kg)  04/19/19 261  lb (118.4 kg)  11/25/18 258 lb (117 kg)     There are no preventive care reminders to display for this patient.  There are no preventive care reminders to display for this patient.  Lab Results  Component Value Date   TSH 1.880 12/23/2017   Lab Results  Component Value Date   WBC 5.5 12/23/2017   HGB 15.9 12/23/2017   HCT 45.0  12/23/2017   MCV 92 12/23/2017   PLT 289 12/23/2017   Lab Results  Component Value Date   NA 137 12/23/2017   K 4.1 12/23/2017   CO2 20 12/23/2017   GLUCOSE 98 12/23/2017   BUN 13 12/23/2017   CREATININE 0.81 12/23/2017   BILITOT 1.3 (H) 12/23/2017   ALKPHOS 63 12/23/2017   AST 58 (H) 12/23/2017   ALT 58 (H) 12/23/2017   PROT 7.2 12/23/2017   ALBUMIN 4.4 12/23/2017   CALCIUM 9.6 12/23/2017   Lab Results  Component Value Date   CHOL 132 12/23/2017   Lab Results  Component Value Date   HDL 27 (L) 12/23/2017   Lab Results  Component Value Date   LDLCALC 70 12/23/2017   Lab Results  Component Value Date   TRIG 173 (H) 12/23/2017   Lab Results  Component Value Date   CHOLHDL 4.9 12/23/2017   Lab Results  Component Value Date   HGBA1C 5.7 (H) 12/23/2017      Assessment & Plan:   Problem List Items Addressed This Visit      Cardiovascular and Mediastinum   HTN (hypertension)   Relevant Medications   lisinopril (ZESTRIL) 20 MG tablet     Musculoskeletal and Integument   Eczema   Relevant Medications   betamethasone dipropionate 0.05 % cream      Meds ordered this encounter  Medications  . lisinopril (ZESTRIL) 20 MG tablet    Sig: TAKE 1 TABLET(20 MG) BY MOUTH DAILY    Dispense:  90 tablet    Refill:  1  . betamethasone dipropionate 0.05 % cream    Sig: Apply topically 2 (two) times daily.    Dispense:  30 g    Refill:  2    Follow-up: No follow-ups on file.   PLAN  Suspect that dental concerns are related to his history of GERD, at the very least GERD is contributory.  Continue OTCs, however, my concern is  his dysphagia. With this longstanding history I think it's prudent to rule out esophageal changes. Will refer to GI  Patient encouraged to call clinic with any questions, comments, or concerns.   Maximiano Coss, NP

## 2019-08-18 NOTE — Patient Instructions (Signed)
° ° ° °  If you have lab work done today you will be contacted with your lab results within the next 2 weeks.  If you have not heard from us then please contact us. The fastest way to get your results is to register for My Chart. ° ° °IF you received an x-ray today, you will receive an invoice from [facility]. Please contact [facility] at [phone] with questions or concerns regarding your invoice.  ° °IF you received labwork today, you will receive an invoice from [facility]. Please contact [facility] at [phone] with questions or concerns regarding your invoice.  ° °Our billing staff will not be able to assist you with questions regarding bills from these companies. ° °You will be contacted with the lab results as soon as they are available. The fastest way to get your results is to activate your My Chart account. Instructions are located on the last page of this paperwork. If you have not heard from us regarding the results in 2 weeks, please contact this office. °  ° ° ° °

## 2019-08-22 ENCOUNTER — Telehealth: Payer: Self-pay | Admitting: Registered Nurse

## 2019-08-22 NOTE — Telephone Encounter (Signed)
Patient called in and requested for some information on how he could get his medical records from [facility]. Please follow up at your earliest convenience.

## 2019-08-22 NOTE — Telephone Encounter (Signed)
I Spoke to patient and I told him when [facility] in 2013 if the patient didn't request their records where going to a storage  and after that the patient will need to pay to get them from there but it was 8 yrs ago and I give him the phone number of [facility] to get more information with them .

## 2019-09-23 DIAGNOSIS — Z20828 Contact with and (suspected) exposure to other viral communicable diseases: Secondary | ICD-10-CM | POA: Diagnosis not present

## 2019-10-17 ENCOUNTER — Ambulatory Visit: Payer: BC Managed Care – PPO | Admitting: Gastroenterology

## 2019-11-07 ENCOUNTER — Other Ambulatory Visit: Payer: Self-pay

## 2019-11-07 DIAGNOSIS — I10 Essential (primary) hypertension: Secondary | ICD-10-CM

## 2019-11-07 MED ORDER — LISINOPRIL 20 MG PO TABS: ORAL_TABLET | ORAL | 1 refills | Status: DC

## 2019-11-07 NOTE — Telephone Encounter (Signed)
Please scheduled pt for a F/U appt

## 2019-11-08 NOTE — Telephone Encounter (Signed)
Pt called back and had a refill for 90 days so sch an appt for 01/29/20.

## 2019-11-08 NOTE — Telephone Encounter (Signed)
LVMTCB to sch 3 month f/u/med refill

## 2019-12-19 ENCOUNTER — Ambulatory Visit: Payer: Self-pay | Admitting: Gastroenterology

## 2019-12-20 ENCOUNTER — Telehealth: Payer: Self-pay | Admitting: Registered Nurse

## 2019-12-20 NOTE — Telephone Encounter (Signed)
Referral Followup

## 2020-01-03 DIAGNOSIS — Z20822 Contact with and (suspected) exposure to covid-19: Secondary | ICD-10-CM | POA: Diagnosis not present

## 2020-01-29 ENCOUNTER — Ambulatory Visit: Payer: BC Managed Care – PPO | Admitting: Registered Nurse

## 2020-02-26 ENCOUNTER — Encounter: Payer: Self-pay | Admitting: Registered Nurse

## 2020-02-26 ENCOUNTER — Ambulatory Visit: Payer: BC Managed Care – PPO | Admitting: Registered Nurse

## 2020-02-26 ENCOUNTER — Other Ambulatory Visit: Payer: Self-pay

## 2020-02-26 VITALS — BP 149/90 | HR 82 | Temp 98.3°F | Resp 18 | Ht 72.0 in | Wt 258.6 lb

## 2020-02-26 DIAGNOSIS — Z13228 Encounter for screening for other metabolic disorders: Secondary | ICD-10-CM | POA: Diagnosis not present

## 2020-02-26 DIAGNOSIS — L2084 Intrinsic (allergic) eczema: Secondary | ICD-10-CM

## 2020-02-26 DIAGNOSIS — Z1329 Encounter for screening for other suspected endocrine disorder: Secondary | ICD-10-CM | POA: Diagnosis not present

## 2020-02-26 DIAGNOSIS — Z130 Encounter for screening for diseases of the blood and blood-forming organs and certain disorders involving the immune mechanism: Secondary | ICD-10-CM | POA: Diagnosis not present

## 2020-02-26 DIAGNOSIS — I10 Essential (primary) hypertension: Secondary | ICD-10-CM | POA: Diagnosis not present

## 2020-02-26 DIAGNOSIS — M25512 Pain in left shoulder: Secondary | ICD-10-CM

## 2020-02-26 DIAGNOSIS — Z13220 Encounter for screening for lipoid disorders: Secondary | ICD-10-CM | POA: Diagnosis not present

## 2020-02-26 LAB — COMPREHENSIVE METABOLIC PANEL
ALT: 43 IU/L (ref 0–44)
BUN: 10 mg/dL (ref 6–24)
Bilirubin Total: 1.2 mg/dL (ref 0.0–1.2)

## 2020-02-26 LAB — CBC WITH DIFFERENTIAL: Basos: 1 %

## 2020-02-26 MED ORDER — METHOCARBAMOL 500 MG PO TABS: 500.0000 mg | ORAL_TABLET | Freq: Four times a day (QID) | ORAL | 0 refills | Status: DC

## 2020-02-26 MED ORDER — BETAMETHASONE DIPROPIONATE 0.05 % EX CREA: TOPICAL_CREAM | Freq: Two times a day (BID) | CUTANEOUS | 2 refills | Status: DC

## 2020-02-26 MED ORDER — LISINOPRIL 40 MG PO TABS: 40.0000 mg | ORAL_TABLET | Freq: Every day | ORAL | 1 refills | Status: DC

## 2020-02-26 MED ORDER — DICLOFENAC SODIUM 75 MG PO TBEC: 75.0000 mg | DELAYED_RELEASE_TABLET | Freq: Two times a day (BID) | ORAL | 0 refills | Status: DC

## 2020-02-26 NOTE — Patient Instructions (Signed)
° ° ° °  If you have lab work done today you will be contacted with your lab results within the next 2 weeks.  If you have not heard from us then please contact us. The fastest way to get your results is to register for My Chart. ° ° °IF you received an x-ray today, you will receive an invoice from [facility]. Please contact [facility] at [phone] with questions or concerns regarding your invoice.  ° °IF you received labwork today, you will receive an invoice from [facility]. Please contact [facility] at [phone] with questions or concerns regarding your invoice.  ° °Our billing staff will not be able to assist you with questions regarding bills from these companies. ° °You will be contacted with the lab results as soon as they are available. The fastest way to get your results is to activate your My Chart account. Instructions are located on the last page of this paperwork. If you have not heard from us regarding the results in 2 weeks, please contact this office. °  ° ° ° °

## 2020-02-26 NOTE — Progress Notes (Signed)
Established Patient Office Visit  Subjective:  Patient ID: Casey [name], male    DOB: Mar 26, 1965  Age: 55 y.o. MRN: [id number]  CC:  Chief Complaint  Patient presents with  . Medication Refill    Patient state he is here for a medication refill. He also have been having some left shoulder pain that comes across his chest and back since christm,as,    HPI [name] presents for htn and shoulder pain  Hypertension: Patient Currently taking: lisinopril 26m PO qd Good effect. No AEs. Denies CV symptoms including: chest pain, shob, doe, visual changes, fatigue, claudication, and dependent edema.  He is having some headaches. They are frontal and relieved by OTC cold / allergy meds. Previous readings and labs: BP Readings from Last 3 Encounters:  02/26/20 (!) 149/90  08/18/19 115/79  04/19/19 117/77   Lab Results  Component Value Date   CREATININE 0.81 12/23/2017   Shoulder pain: Ongoing for around 1 week No precipitating event Triggers include driving and other actions that require him to hold his arm out in front of him Radiates towards chest and spine, but no outright chest pain or spine pain/bony tenderness Pain is predictable and reproducible Full ROM Hx of dislocated shoulder 20-25 years ago, unsure laterality. Has been taking ibuprofen, so-so relief  Eczema Well controlled with intermittent betamethasone use No new symptoms or concerns Needs refill  Past Medical History:  Diagnosis Date  . History of kidney stones   . Hyperglycemia 09/07/2015  . Hypertension   . SINUSITIS, ACUTE 03/08/2008   Qualifier: Diagnosis of  By: MAmil AmenMD, EBenjamine Mola     Past Surgical History:  Procedure Laterality Date  . APPENDECTOMY  2005  . EXTRACORPOREAL SHOCK WAVE LITHOTRIPSY Left 01/27/2018   Procedure: EXTRACORPOREAL SHOCK WAVE LITHOTRIPSY (ESWL);  Surgeon: MBjorn Loser MD;  Location: [facility];  Service: Urology;  Laterality: Left;  . WISDOM TOOTH EXTRACTION       Family History  Problem Relation Age of Onset  . Stroke Mother   . Hypertension Father   . Bladder Cancer Father   . Colon cancer Neg Hx     Social History   Socioeconomic History  . Marital status: Single    Spouse name: Not on file  . Number of children: Not on file  . Years of education: Not on file  . Highest education level: Not on file  Occupational History  . Not on file  Tobacco Use  . Smoking status: Former Smoker    Packs/day: 1.50    Years: 35.00    Pack years: 52.50    Quit date: 11/24/2015    Years since quitting: 4.2  . Smokeless tobacco: Never Used  Vaping Use  . Vaping Use: Never used  Substance and Sexual Activity  . Alcohol use: No    Alcohol/week: 0.0 standard drinks    Comment: rare   . Drug use: No  . Sexual activity: Not Currently  Other Topics Concern  . Not on file  Social History Narrative  . Not on file   Social Determinants of Health   Financial Resource Strain: Not on file  Food Insecurity: Not on file  Transportation Needs: Not on file  Physical Activity: Not on file  Stress: Not on file  Social Connections: Not on file  Intimate Partner Violence: Not on file    Outpatient Medications Prior to Visit  Medication Sig Dispense Refill  . betamethasone dipropionate 0.05 % cream Apply topically 2 (two) times daily.  30 g 2  . lisinopril (ZESTRIL) 20 MG tablet TAKE 1 TABLET(20 MG) BY MOUTH DAILY 90 tablet 1   Facility-Administered Medications Prior to Visit  Medication Dose Route Frequency Provider Last Rate Last Admin  . 0.9 %  sodium chloride infusion  500 mL Intravenous Once Armbruster, Carlota Raspberry, MD        Allergies  Allergen Reactions  . Fish Allergy Rash  . Penicillins Rash    REACTION: had reaction as an infant--cannot recall reaction    ROS Review of Systems Per hpi     Objective:    Physical Exam Constitutional:      General: He is not in acute distress.    Appearance: Normal appearance. He is normal  weight. He is not ill-appearing, toxic-appearing or diaphoretic.  Cardiovascular:     Rate and Rhythm: Normal rate and regular rhythm.     Heart sounds: Normal heart sounds. No murmur heard. No friction rub. No gallop.   Pulmonary:     Effort: Pulmonary effort is normal. No respiratory distress.     Breath sounds: Normal breath sounds. No stridor. No wheezing, rhonchi or rales.  Chest:     Chest wall: No tenderness.  Musculoskeletal:        General: Tenderness present. No swelling, deformity or signs of injury. Normal range of motion.     Right lower leg: No edema.     Left lower leg: No edema.  Skin:    General: Skin is warm and dry.  Neurological:     General: No focal deficit present.     Mental Status: He is alert and oriented to person, place, and time. Mental status is at baseline.  Psychiatric:        Mood and Affect: Mood normal.        Behavior: Behavior normal.        Thought Content: Thought content normal.        Judgment: Judgment normal.     BP (!) 149/90   Pulse 82   Temp 98.3 F (36.8 C) (Temporal)   Resp 18   Ht 6' (1.829 m)   Wt 258 lb 9.6 oz (117.3 kg)   SpO2 94%   BMI 35.07 kg/m  Wt Readings from Last 3 Encounters:  02/26/20 258 lb 9.6 oz (117.3 kg)  08/18/19 257 lb (116.6 kg)  04/19/19 261 lb (118.4 kg)     There are no preventive care reminders to display for this patient.  There are no preventive care reminders to display for this patient.  Lab Results  Component Value Date   TSH 1.880 12/23/2017   Lab Results  Component Value Date   WBC 5.5 12/23/2017   HGB 15.9 12/23/2017   HCT 45.0 12/23/2017   MCV 92 12/23/2017   PLT 289 12/23/2017   Lab Results  Component Value Date   NA 137 12/23/2017   K 4.1 12/23/2017   CO2 20 12/23/2017   GLUCOSE 98 12/23/2017   BUN 13 12/23/2017   CREATININE 0.81 12/23/2017   BILITOT 1.3 (H) 12/23/2017   ALKPHOS 63 12/23/2017   AST 58 (H) 12/23/2017   ALT 58 (H) 12/23/2017   PROT 7.2 12/23/2017    ALBUMIN 4.4 12/23/2017   CALCIUM 9.6 12/23/2017   Lab Results  Component Value Date   CHOL 132 12/23/2017   Lab Results  Component Value Date   HDL 27 (L) 12/23/2017   Lab Results  Component Value Date   LDLCALC 70 12/23/2017  Lab Results  Component Value Date   TRIG 173 (H) 12/23/2017   Lab Results  Component Value Date   CHOLHDL 4.9 12/23/2017   Lab Results  Component Value Date   HGBA1C 5.7 (H) 12/23/2017      Assessment & Plan:   Problem List Items Addressed This Visit      Musculoskeletal and Integument   Eczema   Relevant Medications   betamethasone dipropionate 0.05 % cream    Other Visit Diagnoses    Acute pain of left shoulder    -  Primary   Relevant Medications   diclofenac (VOLTAREN) 75 MG EC tablet   methocarbamol (ROBAXIN) 500 MG tablet   Essential hypertension       Relevant Medications   lisinopril (ZESTRIL) 40 MG tablet   Screening for endocrine, metabolic and immunity disorder       Relevant Orders   CBC With Differential   Comprehensive metabolic panel   Hemoglobin A1c   TSH   Lipid screening       Relevant Orders   Lipid panel      Meds ordered this encounter  Medications  . betamethasone dipropionate 0.05 % cream    Sig: Apply topically 2 (two) times daily.    Dispense:  30 g    Refill:  2  . lisinopril (ZESTRIL) 40 MG tablet    Sig: Take 1 tablet (40 mg total) by mouth daily.    Dispense:  90 tablet    Refill:  1    Order Specific Question:   Supervising Provider    Answer:   Carlota Raspberry, JEFFREY R [[id number]]  . diclofenac (VOLTAREN) 75 MG EC tablet    Sig: Take 1 tablet (75 mg total) by mouth 2 (two) times daily.    Dispense:  30 tablet    Refill:  0    Order Specific Question:   Supervising Provider    Answer:   Carlota Raspberry, JEFFREY R [[id number]]  . methocarbamol (ROBAXIN) 500 MG tablet    Sig: Take 1 tablet (500 mg total) by mouth 4 (four) times daily.    Dispense:  60 tablet    Refill:  0    Order Specific Question:    Supervising Provider    Answer:   Carlota Raspberry, JEFFREY R [[id number]]    Follow-up: No follow-ups on file.   PLAN  Refill betamethasone  Increase lisinopril to 43m PO qd, continue home med checks  Diclofenac and methocarbamol for shoulder pain. Return if worsening, if failing to improve can place PT or ortho referral based on today's visit. Discusses nonpharm  Labs collected. Will follow up with the patient as warranted.  Patient encouraged to call clinic with any questions, comments, or concerns.  RMaximiano Coss NP

## 2020-02-27 LAB — CBC WITH DIFFERENTIAL
Basophils Absolute: 0 10*3/uL (ref 0.0–0.2)
EOS (ABSOLUTE): 0.1 10*3/uL (ref 0.0–0.4)
Eos: 3 %
Hematocrit: 45.7 % (ref 37.5–51.0)
Hemoglobin: 16.6 g/dL (ref 13.0–17.7)
Immature Grans (Abs): 0 10*3/uL (ref 0.0–0.1)
Immature Granulocytes: 0 %
Lymphocytes Absolute: 1.7 10*3/uL (ref 0.7–3.1)
Lymphs: 33 %
MCH: 34.1 pg — ABNORMAL HIGH (ref 26.6–33.0)
MCHC: 36.3 g/dL — ABNORMAL HIGH (ref 31.5–35.7)
MCV: 94 fL (ref 79–97)
Monocytes Absolute: 0.4 10*3/uL (ref 0.1–0.9)
Monocytes: 8 %
Neutrophils Absolute: 3 10*3/uL (ref 1.4–7.0)
Neutrophils: 55 %
RBC: 4.87 x10E6/uL (ref 4.14–5.80)
RDW: 11.8 % (ref 11.6–15.4)
WBC: 5.3 10*3/uL (ref 3.4–10.8)

## 2020-02-27 LAB — COMPREHENSIVE METABOLIC PANEL
AST: 32 IU/L (ref 0–40)
Albumin/Globulin Ratio: 1.8 (ref 1.2–2.2)
Albumin: 4.4 g/dL (ref 3.8–4.9)
Alkaline Phosphatase: 83 IU/L (ref 44–121)
BUN/Creatinine Ratio: 12 (ref 9–20)
CO2: 22 mmol/L (ref 20–29)
Calcium: 9.5 mg/dL (ref 8.7–10.2)
Chloride: 103 mmol/L (ref 96–106)
Creatinine, Ser: 0.83 mg/dL (ref 0.76–1.27)
GFR calc Af Amer: 115 mL/min/{1.73_m2} (ref >59)
GFR calc non Af Amer: 100 mL/min/{1.73_m2} (ref >59)
Globulin, Total: 2.4 g/dL (ref 1.5–4.5)
Glucose: 182 mg/dL — ABNORMAL HIGH (ref 65–99)
Potassium: 4 mmol/L (ref 3.5–5.2)
Sodium: 138 mmol/L (ref 134–144)
Total Protein: 6.8 g/dL (ref 6.0–8.5)

## 2020-02-27 LAB — LIPID PANEL
Chol/HDL Ratio: 4.4 ratio (ref 0.0–5.0)
Cholesterol, Total: 114 mg/dL (ref 100–199)
HDL: 26 mg/dL — ABNORMAL LOW (ref >39)
LDL Chol Calc (NIH): 66 mg/dL (ref 0–99)
Triglycerides: 122 mg/dL (ref 0–149)
VLDL Cholesterol Cal: 22 mg/dL (ref 5–40)

## 2020-02-27 LAB — HEMOGLOBIN A1C
Est. average glucose Bld gHb Est-mCnc: 137 mg/dL
Hgb A1c MFr Bld: 6.4 % — ABNORMAL HIGH (ref 4.8–5.6)

## 2020-02-27 LAB — TSH: TSH: 2.17 u[IU]/mL (ref 0.450–4.500)

## 2020-03-02 ENCOUNTER — Encounter: Payer: Self-pay | Admitting: Registered Nurse

## 2020-03-09 DIAGNOSIS — Z20822 Contact with and (suspected) exposure to covid-19: Secondary | ICD-10-CM | POA: Diagnosis not present

## 2020-03-20 ENCOUNTER — Other Ambulatory Visit: Payer: Self-pay

## 2020-03-20 ENCOUNTER — Ambulatory Visit: Payer: BC Managed Care – PPO | Admitting: Registered Nurse

## 2020-03-20 ENCOUNTER — Encounter: Payer: Self-pay | Admitting: Registered Nurse

## 2020-03-20 VITALS — BP 133/83 | HR 78 | Temp 98.0°F | Resp 18 | Ht 73.5 in | Wt 260.2 lb

## 2020-03-20 DIAGNOSIS — M25512 Pain in left shoulder: Secondary | ICD-10-CM

## 2020-03-20 NOTE — Patient Instructions (Signed)
° ° ° °  If you have lab work done today you will be contacted with your lab results within the next 2 weeks.  If you have not heard from us then please contact us. The fastest way to get your results is to register for My Chart. ° ° °IF you received an x-ray today, you will receive an invoice from [facility]. Please contact [facility] at [phone] with questions or concerns regarding your invoice.  ° °IF you received labwork today, you will receive an invoice from [facility]. Please contact [facility] at [phone] with questions or concerns regarding your invoice.  ° °Our billing staff will not be able to assist you with questions regarding bills from these companies. ° °You will be contacted with the lab results as soon as they are available. The fastest way to get your results is to activate your My Chart account. Instructions are located on the last page of this paperwork. If you have not heard from us regarding the results in 2 weeks, please contact this office. °  ° ° ° °

## 2020-03-21 ENCOUNTER — Other Ambulatory Visit: Payer: Self-pay | Admitting: Registered Nurse

## 2020-03-21 DIAGNOSIS — M25512 Pain in left shoulder: Secondary | ICD-10-CM

## 2020-03-21 NOTE — Telephone Encounter (Signed)
Requested medication (s) are due for refill today: yes  Requested medication (s) are on the active medication list: yes  Last refill:  02/26/20 #30  Future visit scheduled: no  Notes to clinic:  Please review if refill appropriate. Last office visit on 03/21/20. No notes on noted in chart.    Requested Prescriptions  Pending Prescriptions Disp Refills   diclofenac (VOLTAREN) 75 MG EC tablet 30 tablet 0    Sig: Take 1 tablet (75 mg total) by mouth 2 (two) times daily.      Analgesics:  NSAIDS Passed - 03/21/2020 12:42 PM      Passed - Cr in normal range and within 360 days    Creat  Date Value Ref Range Status  10/04/2015 0.91 0.70 - 1.33 mg/dL Final    Comment:      For patients > or = 55 years of age: The upper reference limit for Creatinine is approximately 13% higher for people identified as African-American.      Creatinine, Ser  Date Value Ref Range Status  02/26/2020 0.83 0.76 - 1.27 mg/dL Final          Passed - HGB in normal range and within 360 days    Hemoglobin  Date Value Ref Range Status  02/26/2020 16.6 13.0 - 17.7 g/dL Final          Passed - Patient is not pregnant      Passed - Valid encounter within last 12 months    Recent Outpatient Visits           Yesterday    [facility], [place], NP   3 weeks ago Acute pain of left shoulder   [facility], [place], NP   7 months ago Gastroesophageal reflux disease, unspecified whether esophagitis present   [facility], [place]elfino Lovett, NP   1 year ago Hearing decreased, left   [facility], Delfino Lovett, NP   1 year ago Pain in both knees, unspecified chronicity   [facility], Delfino Lovett, NP

## 2020-03-21 NOTE — Telephone Encounter (Signed)
Patient is requesting a refill of the following medications: Requested Prescriptions   Pending Prescriptions Disp Refills   diclofenac (VOLTAREN) 75 MG EC tablet 30 tablet 0    Sig: Take 1 tablet (75 mg total) by mouth 2 (two) times daily.    Date of patient request: 03/21/2020 Last office visit: 03/20/2020 Date of last refill: 02/26/2020 Last refill amount: 30

## 2020-03-21 NOTE — Telephone Encounter (Signed)
Medication Refill - Medication: Pt is requesting prednisone  diclofenac (VOLTAREN) 75 MG EC tablet With refills he states, and whatever else was discussed during his visit yesterday with PCP.   Has the patient contacted their pharmacy? Yes.   (Agent: If no, request that the patient contact the pharmacy for the refill.) (Agent: If yes, when and what did the pharmacy advise?)  Preferred Pharmacy (with phone number or street name):  [facility], [address]  [place] [address]  Phone: [phone] Fax: [phone]     Agent: Please be advised that RX refills may take up to 3 business days. We ask that you follow-up with your pharmacy.

## 2020-03-22 ENCOUNTER — Encounter: Payer: Self-pay | Admitting: Registered Nurse

## 2020-03-22 ENCOUNTER — Other Ambulatory Visit: Payer: Self-pay

## 2020-03-22 ENCOUNTER — Telehealth: Payer: Self-pay | Admitting: Registered Nurse

## 2020-03-22 DIAGNOSIS — M25512 Pain in left shoulder: Secondary | ICD-10-CM

## 2020-03-22 MED ORDER — DICLOFENAC SODIUM 75 MG PO TBEC: 75.0000 mg | DELAYED_RELEASE_TABLET | Freq: Two times a day (BID) | ORAL | 0 refills | Status: DC

## 2020-03-22 NOTE — Telephone Encounter (Signed)
Patient is wanting to follow up on three referrals made on his last appt   [facility]  allergist  And  [facility] cancer specialists

## 2020-03-22 NOTE — Telephone Encounter (Signed)
Patient need a prescription for Prednisone. Please Advise

## 2020-03-22 NOTE — Telephone Encounter (Signed)
Patient came into office after  Leaving several messages and is needing Prednisone as discussed on his last visit     Pleas advise

## 2020-03-24 MED ORDER — DICLOFENAC SODIUM 75 MG PO TBEC: 75.0000 mg | DELAYED_RELEASE_TABLET | Freq: Two times a day (BID) | ORAL | 0 refills | Status: DC

## 2020-03-25 ENCOUNTER — Other Ambulatory Visit: Payer: Self-pay | Admitting: Registered Nurse

## 2020-03-25 DIAGNOSIS — G8929 Other chronic pain: Secondary | ICD-10-CM

## 2020-03-25 MED ORDER — PREDNISONE 10 MG PO TABS: ORAL_TABLET | ORAL | 0 refills | Status: AC

## 2020-04-10 NOTE — Telephone Encounter (Signed)
Patient is looking for 3 referrals that were suppose to be sent on his last office visit.

## 2020-04-23 NOTE — Telephone Encounter (Signed)
Pt was under the impression he was being referred to Orthopedic specialist for shoulder pain, an allergist and an oncologist/pulmonologist? For lung cancer??  Please advise pt has called back 2 times unsure why first message was not forwarded

## 2020-04-23 NOTE — Telephone Encounter (Signed)
Pt is waiting on referral to the below specialists. Pt would like a callback

## 2020-04-25 ENCOUNTER — Encounter: Payer: Self-pay | Admitting: Registered Nurse

## 2020-04-25 NOTE — Telephone Encounter (Signed)
Pt waiting on referrals as he has reached out several times please advise.

## 2020-04-26 ENCOUNTER — Telehealth: Payer: Self-pay | Admitting: Registered Nurse

## 2020-04-26 DIAGNOSIS — K219 Gastro-esophageal reflux disease without esophagitis: Secondary | ICD-10-CM

## 2020-04-26 DIAGNOSIS — Z122 Encounter for screening for malignant neoplasm of respiratory organs: Secondary | ICD-10-CM

## 2020-04-26 DIAGNOSIS — G8929 Other chronic pain: Secondary | ICD-10-CM

## 2020-04-26 DIAGNOSIS — J45909 Unspecified asthma, uncomplicated: Secondary | ICD-10-CM

## 2020-04-26 NOTE — Telephone Encounter (Signed)
Pt is stating he is waiting on 3 referrals to be completed. He sent a pt message in on 04/25/20. Please advise

## 2020-04-26 NOTE — Telephone Encounter (Signed)
Called pt and gathered more information about the referrals. I have placed them for him and he stated understanding.

## 2020-05-01 ENCOUNTER — Telehealth: Payer: Self-pay | Admitting: *Deleted

## 2020-05-01 DIAGNOSIS — Z87891 Personal history of nicotine dependence: Secondary | ICD-10-CM

## 2020-05-02 NOTE — Telephone Encounter (Signed)
Spoke with pt and scheduled SD[facility] 05/27/20 9:00 CT ordered and will be scheduled  Nothing further needed

## 2020-05-04 ENCOUNTER — Other Ambulatory Visit: Payer: Self-pay | Admitting: Registered Nurse

## 2020-05-04 DIAGNOSIS — M25512 Pain in left shoulder: Secondary | ICD-10-CM

## 2020-05-05 NOTE — Telephone Encounter (Signed)
Requested Prescriptions  Pending Prescriptions Disp Refills  . diclofenac (VOLTAREN) 75 MG EC tablet [Pharmacy Med Name: DICLOFENAC SODIUM 75MG DR TABLETS] 30 tablet 0    Sig: TAKE 1 TABLET(75 MG) BY MOUTH TWICE DAILY     Analgesics:  NSAIDS Passed - 05/04/2020  7:53 PM      Passed - Cr in normal range and within 360 days    Creat  Date Value Ref Range Status  10/04/2015 0.91 0.70 - 1.33 mg/dL Final    Comment:      For patients > or = 55 years of age: The upper reference limit for Creatinine is approximately 13% higher for people identified as African-American.      Creatinine, Ser  Date Value Ref Range Status  02/26/2020 0.83 0.76 - 1.27 mg/dL Final         Passed - HGB in normal range and within 360 days    Hemoglobin  Date Value Ref Range Status  02/26/2020 16.6 13.0 - 17.7 g/dL Final         Passed - Patient is not pregnant      Passed - Valid encounter within last 12 months    Recent Outpatient Visits          1 month ago    [facility], [place], NP   2 months ago Acute pain of left shoulder   [facility], [place], NP   8 months ago Gastroesophageal reflux disease, unspecified whether esophagitis present   [facility], Delfino Lovett, NP   1 year ago Hearing decreased, left   [facility], Delfino Lovett, NP   1 year ago Pain in both knees, unspecified chronicity   [facility], Delfino Lovett, NP

## 2020-05-07 ENCOUNTER — Ambulatory Visit: Payer: BC Managed Care – PPO | Admitting: Family Medicine

## 2020-05-07 ENCOUNTER — Other Ambulatory Visit: Payer: Self-pay

## 2020-05-07 DIAGNOSIS — M25512 Pain in left shoulder: Secondary | ICD-10-CM | POA: Diagnosis not present

## 2020-05-07 DIAGNOSIS — G8929 Other chronic pain: Secondary | ICD-10-CM | POA: Diagnosis not present

## 2020-05-07 NOTE — Patient Instructions (Signed)
    Glucosamine Sulfate:  1,000 mg twice daily  Turmeric:  500 mg twice daily

## 2020-05-07 NOTE — Progress Notes (Signed)
Office Visit Note   Patient: Casey [name]           Date of Birth: 01-24-1966           MRN: [id number] Visit Date: 05/07/2020 Requested by: Maximiano Coss, NP [place],  [place] [address] PCP: Maximiano Coss, NP  Subjective: Chief Complaint  Patient presents with  . Left Shoulder - Pain    Pain started 02/18/20. Worse with driving. Has never been excruciating pain, but has been constant. The pain has subsided over this past weekend. Taking diclofenac.     HPI: He is here with left shoulder pain.  Symptoms started December 26, no injury.  Pain on top and in the lateral holder, not severe but on a daily basis.  Since making his appointment, over the past week and his pain seems to have subsided for the most part but because it was so persistent, he wanted to come in to be sure everything was okay.  He is right-hand dominant.  Denies any numbness or tingling in his arm, denies any weakness.  He does have occasional soreness in his neck.  The pain is worse when driving while holding the steering wheel with his left hand.  He tried a muscle relaxant and diclofenac but it did not help his shoulder, however he got good relief from some knee pain that he has been having.  He is otherwise been in good health.               ROS:   All other systems were reviewed and are negative.  Objective: Vital Signs: There were no vitals taken for this visit.  Physical Exam:  General:  Alert and oriented, in no acute distress. Pulm:  Breathing unlabored. Psy:  Normal mood, congruent affect. Skin: No rash Left shoulder: Full active range of motion compared to the right.  Spurling's test is negative for radicular pain but it does cause some soreness on the left side of his neck which somewhat limits the pain he has been having.  No tenderness at the [facility] joint, negative AC crossover test.  No tenderness in the subacromial space.  Rotator cuff strength is 5/5 throughout and pain-free.  Biceps,  triceps, wrist and intrinsic hand strength are normal and DTRs are 2+ bilaterally.  Imaging: No results found.  Assessment & Plan: 1.  Resolved left shoulder pain, etiology uncertain.  Could be impingement versus cervical nerve irritation. -We will start rotator cuff strengthening for maintenance.  Trial of glucosamine and turmeric.  If symptoms recur, he will message me and we will consider imaging with x-rays of the left shoulder and cervical spine followed by physical therapy referral.  If that does not help, then MRI scan imaging.     Procedures: No procedures performed        PMFS History: Patient Active Problem List   Diagnosis Date Noted  . Hearing decreased, left 11/25/2018  . Patellofemoral pain syndrome of both knees 09/19/2018  . HTN (hypertension) 09/07/2015  . Eczema 04/06/2015   Past Medical History:  Diagnosis Date  . History of kidney stones   . Hyperglycemia 09/07/2015  . Hypertension   . SINUSITIS, ACUTE 03/08/2008   Qualifier: Diagnosis of  By: Amil Amen MD, Gypsy Lore History  Problem Relation Age of Onset  . Stroke Mother   . Hypertension Father   . Bladder Cancer Father   . Colon cancer Neg Hx     Past Surgical History:  Procedure Laterality Date  . APPENDECTOMY  2005  . EXTRACORPOREAL SHOCK WAVE LITHOTRIPSY Left 01/27/2018   Procedure: EXTRACORPOREAL SHOCK WAVE LITHOTRIPSY (ESWL);  Surgeon: Bjorn Loser, MD;  Location: [facility];  Service: Urology;  Laterality: Left;  . WISDOM TOOTH EXTRACTION     Social History   Occupational History  . Not on file  Tobacco Use  . Smoking status: Former Smoker    Packs/day: 1.50    Years: 35.00    Pack years: 52.50    Quit date: 11/24/2015    Years since quitting: 4.4  . Smokeless tobacco: Never Used  Vaping Use  . Vaping Use: Never used  Substance and Sexual Activity  . Alcohol use: No    Alcohol/week: 0.0 standard drinks    Comment: rare   . Drug use: No  . Sexual activity: Not  Currently

## 2020-05-27 ENCOUNTER — Other Ambulatory Visit: Payer: Self-pay

## 2020-05-27 ENCOUNTER — Ambulatory Visit
Admission: RE | Admit: 2020-05-27 | Discharge: 2020-05-27 | Disposition: A | Discharge status: Home / Self Care | Payer: BC Managed Care – PPO | Source: Ambulatory Visit | Attending: Acute Care | Admitting: Acute Care

## 2020-05-27 ENCOUNTER — Encounter: Payer: Self-pay | Admitting: Acute Care

## 2020-05-27 ENCOUNTER — Ambulatory Visit (INDEPENDENT_AMBULATORY_CARE_PROVIDER_SITE_OTHER): Payer: BC Managed Care – PPO | Admitting: Acute Care

## 2020-05-27 VITALS — BP 122/84 | HR 79 | Temp 97.0°F | Ht 73.5 in | Wt 254.6 lb

## 2020-05-27 DIAGNOSIS — Z122 Encounter for screening for malignant neoplasm of respiratory organs: Secondary | ICD-10-CM

## 2020-05-27 DIAGNOSIS — Z87891 Personal history of nicotine dependence: Secondary | ICD-10-CM

## 2020-05-27 NOTE — Patient Instructions (Signed)
Thank you for participating in the [facility] Scre[facility]. It was our pleasure to meet you today. We will call you with the results of your scan within the next few days. Your scan will be assigned a Lung RADS category score by the physicians reading the scans.  This Lung RADS score determines follow up scanning.  See below for description of categories, and follow up screening recommendations. We will be in touch to schedule your follow up screening annually or based on recommendations of our providers. We will fax a copy of your scan results to your Primary Care Physician, or the physician who referred you to the program, to ensure they have the results. Please call the office if you have any questions or concerns regarding your scanning experience or results.  Our office number is [phone]. Please speak with Denise Phelps, RN. She is our Lung Cancer Screening RN. If she is unavailable when you call, please have the office staff send her a message. She will return your call at her earliest convenience. Remember, if your scan is normal, we will scan you annually as long as you continue to meet the criteria for the program. (Age 55-77, Current smoker or smoker who has quit within the last 15 years). If you are a smoker, remember, quitting is the single most powerful action that you can take to decrease your risk of lung cancer and other pulmonary, breathing related problems. We know quitting is hard, and we are here to help.  Please let us know if there is anything we can do to help you meet your goal of quitting. If you are a former smoker, congratulations. We are proud of you! Remain smoke free! Remember you can refer friends or family members through the number above.  We will screen them to make sure they meet criteria for the program. Thank you for helping us take better care of you by participating in Lung Screening.  Lung RADS Categories:  Lung RADS 1: no nodules  or definitely non-concerning nodules.  Recommendation is for a repeat annual scan in 12 months.  Lung RADS 2:  nodules that are non-concerning in appearance and behavior with a very low likelihood of becoming an active cancer. Recommendation is for a repeat annual scan in 12 months.  Lung RADS 3: nodules that are probably non-concerning , includes nodules with a low likelihood of becoming an active cancer.  Recommendation is for a 6-month repeat screening scan. Often noted after an upper respiratory illness. We will be in touch to make sure you have no questions, and to schedule your 6-month scan.  Lung RADS 4 A: nodules with concerning findings, recommendation is most often for a follow up scan in 3 months or additional testing based on our provider's assessment of the scan. We will be in touch to make sure you have no questions and to schedule the recommended 3 month follow up scan.  Lung RADS 4 B:  indicates findings that are concerning. We will be in touch with you to schedule additional diagnostic testing based on our provider's  assessment of the scan.   

## 2020-05-27 NOTE — Progress Notes (Signed)
Shared Decision Making Visit Lung Cancer Screening Program [phone])   Eligibility:  Age 55 y.o.  Pack Years Smoking History Calculation 61 pack year smoking history (# packs/per year x # years smoked)  Recent History of coughing up blood  no  Unexplained weight loss? no ( >Than 15 pounds within the last 6 months )  Prior History Lung / other cancer no (Diagnosis within the last 5 years already requiring surveillance chest CT Scans).  Smoking Status Former Smoker  Former Smokers: Years since quit: 4 years  Quit Date: 11/2015  Visit Components:  Discussion included one or more decision making aids. yes  Discussion included risk/benefits of screening. yes  Discussion included potential follow up diagnostic testing for abnormal scans. yes  Discussion included meaning and risk of over diagnosis. yes  Discussion included meaning and risk of False Positives. yes  Discussion included meaning of total radiation exposure. yes  Counseling Included:  Importance of adherence to annual lung cancer LDCT screening. yes  Impact of comorbidities on ability to participate in the program. yes  Ability and willingness to under diagnostic treatment. yes  Smoking Cessation Counseling:  Current Smokers:   Discussed importance of smoking cessation. yes  Information about tobacco cessation classes and interventions provided to patient. yes  Patient provided with "ticket" for LDCT Scan. yes  Symptomatic Patient. no  Counseling  Diagnosis Code: Tobacco Use Z72.0  Asymptomatic Patient yes  Counseling (Intermediate counseling: > three minutes counseling) Y9244  Former Smokers:   Discussed the importance of maintaining cigarette abstinence. yes  Diagnosis Code: Personal History of Nicotine Dependence. Q28.638  Information about tobacco cessation classes and interventions provided to patient. Yes  Patient provided with "ticket" for LDCT Scan. yes  Written Order for Lung  Cancer Screening with LDCT placed in Epic. Yes (CT Chest Lung Cancer Screening Low Dose W/O CM) [id number] Z12.2-Screening of respiratory organs Z87.891-Personal history of nicotine dependence  I spent 25 minutes of face to face time with Mr. Sassaman discussing the risks and benefits of lung cancer screening. We viewed a power point together that explained in detail the above noted topics. We took the time to pause the power point at intervals to allow for questions to be asked and answered to ensure understanding. We discussed that he had taken the single most powerful action possible to decrease his risk of developing lung cancer when he quit smoking. I counseled him to remain smoke free, and to contact me if he ever had the desire to smoke again so that I can provide resources and tools to help support the effort to remain smoke free. We discussed the time and location of the scan, and that either  Doroteo Glassman RN or I will call with the results within  24-48 hours of receiving them. He has my card and contact information in the event he needs to speak with me, in addition to a copy of the power point we reviewed as a resource. He verbalized understanding of all of the above and had no further questions upon leaving the office.     I explained to the patient that there has been a high incidence of coronary artery disease noted on these exams. I explained that this is a non-gated exam therefore degree or severity cannot be determined. This patient is not on statin therapy. I have asked the patient to follow-up with their PCP regarding any incidental finding of coronary artery disease and management with diet or medication as they feel is clinically  indicated. The patient verbalized understanding of the above and had no further questions.     Magdalen Spatz, NP 05/27/2020

## 2020-05-28 NOTE — Progress Notes (Signed)
Please call patient and let them  know their  low dose Ct was read as a Lung RADS 1, negative study: no nodules or definitely benign nodules. Radiology recommendation is for a repeat LDCT in 12 months. .Please let them  know we will order and schedule their  annual screening scan for 04/2021. Please let them  know there was notation of CAD on their  scan.  Please remind the patient  that this is a non-gated exam therefore degree or severity of disease  cannot be determined. Please have them  follow up with their PCP regarding potential risk factor modification, dietary therapy or pharmacologic therapy if clinically indicated. Pt.  is not currently on statin therapy. Please place order for annual  screening scan for  04/2021 and fax results to PCP. Thanks so much.

## 2020-05-31 ENCOUNTER — Telehealth: Payer: Self-pay | Admitting: *Deleted

## 2020-05-31 DIAGNOSIS — Z87891 Personal history of nicotine dependence: Secondary | ICD-10-CM

## 2020-06-03 NOTE — Telephone Encounter (Signed)
Pt informed of CT results per Sarah Groce, NP.  PT verbalized understanding.  Copy sent to PCP.  Order placed for 1 yr f/u CT.  

## 2020-07-16 DIAGNOSIS — D414 Neoplasm of uncertain behavior of bladder: Secondary | ICD-10-CM | POA: Diagnosis not present

## 2020-07-16 DIAGNOSIS — N200 Calculus of kidney: Secondary | ICD-10-CM | POA: Diagnosis not present

## 2020-07-16 DIAGNOSIS — R8271 Bacteriuria: Secondary | ICD-10-CM | POA: Diagnosis not present

## 2020-07-17 ENCOUNTER — Other Ambulatory Visit: Payer: Self-pay

## 2020-07-18 ENCOUNTER — Other Ambulatory Visit: Payer: Self-pay

## 2020-07-18 ENCOUNTER — Encounter: Payer: Self-pay | Admitting: Internal Medicine

## 2020-07-18 ENCOUNTER — Ambulatory Visit: Payer: BC Managed Care – PPO | Admitting: Internal Medicine

## 2020-07-18 ENCOUNTER — Other Ambulatory Visit: Payer: Self-pay | Admitting: Urology

## 2020-07-18 VITALS — BP 128/84 | HR 84 | Temp 98.2°F | Resp 16 | Ht 73.5 in | Wt 252.0 lb

## 2020-07-18 DIAGNOSIS — Z23 Encounter for immunization: Secondary | ICD-10-CM

## 2020-07-18 DIAGNOSIS — Z125 Encounter for screening for malignant neoplasm of prostate: Secondary | ICD-10-CM | POA: Diagnosis not present

## 2020-07-18 DIAGNOSIS — Z0001 Encounter for general adult medical examination with abnormal findings: Secondary | ICD-10-CM

## 2020-07-18 DIAGNOSIS — I10 Essential (primary) hypertension: Secondary | ICD-10-CM

## 2020-07-18 DIAGNOSIS — R7303 Prediabetes: Secondary | ICD-10-CM | POA: Diagnosis not present

## 2020-07-18 LAB — POCT GLYCOSYLATED HEMOGLOBIN (HGB A1C): Hemoglobin A1C: 6.4 % — AB (ref 4.0–5.6)

## 2020-07-18 LAB — PSA: PSA: 0.59 ng/mL (ref 0.10–4.00)

## 2020-07-18 LAB — BASIC METABOLIC PANEL
BUN: 13 mg/dL (ref 6–23)
CO2: 26 mEq/L (ref 19–32)
Calcium: 9.7 mg/dL (ref 8.4–10.5)
Chloride: 103 mEq/L (ref 96–112)
Creatinine, Ser: 0.83 mg/dL (ref 0.40–1.50)
GFR: 98.83 mL/min (ref >60.00)
Glucose, Bld: 104 mg/dL — ABNORMAL HIGH (ref 70–99)
Potassium: 4.2 mEq/L (ref 3.5–5.1)
Sodium: 137 mEq/L (ref 135–145)

## 2020-07-18 NOTE — Progress Notes (Signed)
Subjective:  Patient ID: Casey [name], male    DOB: [birth year]  Age: 55 y.o. MRN: [id number]  CC: Annual Exam and Hypertension  This visit occurred during the SARS-CoV-2 public health emergency.  Safety protocols were in place, including screening questions prior to the visit, additional usage of staff PPE, and extensive cleaning of exam room while observing appropriate contact time as indicated for disinfecting solutions.    HPI Casey [name] presents for a CPX, f/up, and to establish.  He has a history of hypertension, prediabetes, and left bundle branch block.  He tells me the LBBB has been evaluated by cardiology and is considered benign.  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  He is active and denies any recent episodes of headache, blurred vision, chest pain, shortness of breath, diaphoresis, dizziness, or lightheadedness.  History Casey [name] has a past medical history of History of kidney stones, Hyperglycemia (09/07/2015), Hypertension, and SINUSITIS, ACUTE (03/08/2008).   He has a past surgical history that includes Appendectomy (2005); Wisdom tooth extraction; and Extracorporeal shock wave lithotripsy (Left, 01/27/2018).   His family history includes Alcohol abuse in his father and mother; Bladder Cancer in his father; CVA in his father; Diabetes in his mother; Hypertension in his father; Stroke in his mother.He reports that he quit smoking about 4 years ago. He has a 61.25 pack-year smoking history. He has never used smokeless tobacco. He reports that he does not drink alcohol and does not use drugs.  Outpatient Medications Prior to Visit  Medication Sig Dispense Refill  . betamethasone dipropionate 0.05 % cream Apply topically 2 (two) times daily. 30 g 2  . diclofenac (VOLTAREN) 75 MG EC tablet TAKE 1 TABLET(75 MG) BY MOUTH TWICE DAILY 30 tablet 0  . lisinopril (ZESTRIL) 40 MG tablet Take 1 tablet (40 mg total) by mouth daily. 90 tablet 1    Facility-Administered Medications Prior to Visit  Medication Dose Route Frequency Provider Last Rate Last Admin  . 0.9 %  sodium chloride infusion  500 mL Intravenous Once Casey [name], Casey Raspberry, MD        ROS Review of Systems  Constitutional: Negative.  Negative for diaphoresis and fatigue.  HENT: Negative.   Eyes: Negative.   Respiratory: Negative for cough, chest tightness, shortness of breath and wheezing.   Cardiovascular: Negative for chest pain, palpitations and leg swelling.  Gastrointestinal: Negative for abdominal pain, blood in stool, constipation, diarrhea, nausea and vomiting.  Endocrine: Negative.   Genitourinary: Negative.   Musculoskeletal: Negative.  Negative for arthralgias and myalgias.  Skin: Negative.   Neurological: Negative.  Negative for dizziness, weakness, light-headedness and headaches.  Hematological: Negative for adenopathy. Does not bruise/bleed easily.  Psychiatric/Behavioral: Negative.     Objective:  BP 128/84 (BP Location: Left Arm, Patient Position: Sitting, Cuff Size: Large)   Pulse 84   Temp 98.2 F (36.8 C) (Oral)   Resp 16   Ht 6' 1.5" (1.867 m)   Wt 252 lb (114.3 kg)   SpO2 98%   BMI 32.80 kg/m   Physical Exam Vitals reviewed.  HENT:     Nose: Nose normal.     Mouth/Throat:     Mouth: Mucous membranes are moist.  Eyes:     General: No scleral icterus.    Conjunctiva/sclera: Conjunctivae normal.  Cardiovascular:     Rate and Rhythm: Normal rate and regular rhythm.     Heart sounds: No murmur heard.   Pulmonary:     Effort: Pulmonary  effort is normal.     Breath sounds: No stridor. No wheezing, rhonchi or rales.  Abdominal:     General: Abdomen is protuberant. Bowel sounds are normal. There is no distension.     Palpations: Abdomen is soft. There is no hepatomegaly, splenomegaly or mass.     Tenderness: There is no abdominal tenderness.  Musculoskeletal:        General: Normal range of motion.     Cervical back: Neck  supple.     Right lower leg: No edema.     Left lower leg: No edema.  Lymphadenopathy:     Cervical: No cervical adenopathy.  Skin:    General: Skin is warm and dry.  Neurological:     General: No focal deficit present.     Mental Status: He is alert.  Psychiatric:        Mood and Affect: Mood normal.     Lab Results  Component Value Date   WBC 5.3 02/26/2020   HGB 16.6 02/26/2020   HCT 45.7 02/26/2020   PLT 289 12/23/2017   GLUCOSE 104 (H) 07/18/2020   CHOL 114 02/26/2020   TRIG 122 02/26/2020   HDL 26 (L) 02/26/2020   LDLCALC 66 02/26/2020   ALT 43 02/26/2020   AST 32 02/26/2020   NA 137 07/18/2020   K 4.2 07/18/2020   CL 103 07/18/2020   CREATININE 0.83 07/18/2020   BUN 13 07/18/2020   CO2 26 07/18/2020   TSH 2.170 02/26/2020   PSA 0.59 07/18/2020   HGBA1C 6.4 (A) 07/18/2020    Assessment & Plan:   Casey [name] was seen today for annual exam and hypertension.  Diagnoses and all orders for this visit:  Encounter for general adult medical examination with abnormal findings- Exam completed, labs reviewed - Statin therapy is not indicated, vaccines reviewed and updated, cancer screenings are up-to-date, patient education was given. -     PSA; Future -     PSA  Prediabetes- His A1c is at 6.4%.  He is committed to improving his lifestyle modifications. -     POCT glycosylated hemoglobin (Hb A1C) -     Basic metabolic panel; Future -     Basic metabolic panel  Primary hypertension- His blood pressure is adequately well controlled.  Electrolytes and renal function are normal.  Will continue the current dose of the ACE inhibitor. -     Basic metabolic panel; Future -     Basic metabolic panel  Other orders -     Tdap vaccine greater than or equal to 7yo IM   I am having Casey [name] maintain his betamethasone dipropionate, lisinopril, and diclofenac. We will continue to administer sodium chloride.  No orders of the defined types were placed in this  encounter.    Follow-up: Return in about 6 months (around 01/18/2021).  Casey Calico, MD

## 2020-07-18 NOTE — Patient Instructions (Signed)

## 2020-07-19 ENCOUNTER — Encounter: Payer: Self-pay | Admitting: Internal Medicine

## 2020-08-06 ENCOUNTER — Other Ambulatory Visit: Payer: Self-pay

## 2020-08-06 ENCOUNTER — Encounter: Payer: Self-pay | Admitting: Allergy & Immunology

## 2020-08-06 ENCOUNTER — Ambulatory Visit: Payer: BC Managed Care – PPO | Admitting: Allergy & Immunology

## 2020-08-06 VITALS — BP 120/74 | HR 74 | Temp 97.9°F | Ht 73.0 in | Wt 252.0 lb

## 2020-08-06 DIAGNOSIS — L2089 Other atopic dermatitis: Secondary | ICD-10-CM

## 2020-08-06 DIAGNOSIS — J302 Other seasonal allergic rhinitis: Secondary | ICD-10-CM | POA: Diagnosis not present

## 2020-08-06 DIAGNOSIS — T50905D Adverse effect of unspecified drugs, medicaments and biological substances, subsequent encounter: Secondary | ICD-10-CM | POA: Diagnosis not present

## 2020-08-06 DIAGNOSIS — T7800XD Anaphylactic reaction due to unspecified food, subsequent encounter: Secondary | ICD-10-CM

## 2020-08-06 MED ORDER — EPINEPHRINE 0.3 MG/0.3ML IJ SOAJ: 0.3000 mg | Freq: Once | INTRAMUSCULAR | 2 refills | Status: AC

## 2020-08-06 NOTE — Patient Instructions (Addendum)
1. Adverse effect of drug - You passed the penicillin challenge today. - We are not going to remove this from her allergy list. - We have physicians on call 24/7 in case you need [place].  2. Flexural atopic dermatitis - Continue with betamethasone as needed to the rash on your legs. - I would highly recommend moisturizing since moist skin tends to itch less than dry skin. - Add on Zyrtec (cetirizine) 10 mg nightly to help with itching.  3. Anaphylactic shock due to food (seafood) - We are not going to get testing to look at seafood allergy levels. - We we will contact you in 1 to 2 weeks with the results of the testing. - We can send an EpiPen if you are interested, otherwise we can wait until the testing gets back.  4. Seasonal allergic rhinitis - Since we are getting blood, I will just add on an environmental allergy panel via the blood. - The addition of Zyrtec (cetirizine) should help with any allergy symptoms 2.  5. Return in about 3 months (around 11/06/2020).    Please inform us of any Emergency Department visits, hospitalizations, or changes in symptoms. Call us before going to the ED for breathing or allergy symptoms since we might be able to fit you in for a sick visit. Feel free to contact us anytime with any questions, problems, or concerns.  It was a pleasure to meet you today!  Websites that have reliable patient information: 1. American Academy of Asthma, Allergy, and Immunology: www.aaaai.org 2. Food Allergy Research and Education (FARE): foodallergy.org 3. Mothers of Asthmatics: http://www.asthmacommunitynetwork.org 4. American College of Allergy, Asthma, and Immunology: www.acaai.org   COVID-19 Vaccine Information can be found at: ShippingScam.co.uk For questions related to vaccine distribution or appointments, please email [email or web address] or call [phone].   We realize that you might be concerned  about having an allergic reaction to the COVID19 vaccines. To help with that concern, WE ARE OFFERING THE COVID19 VACCINES IN OUR OFFICE! Ask the front desk for dates!     "Like" [place] on Facebook and Instagram for our latest updates!      A healthy democracy works best when [employer] participate! Make sure you are registered to vote! If you have moved or changed any of your contact information, you will need to get this updated before voting!  In some cases, you MAY be able to register to vote online: [email or web address]

## 2020-08-06 NOTE — Progress Notes (Signed)
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 Adverse effect of drug - You passed the penicillin challenge today. - We are not going to remove this from her allergy list. - We have physicians on call 24/7 in case you need [place].  2. Flexural atopic dermatitis - Continue with betamethasone as needed to the rash on your legs. - I would highly recommend moisturizing since moist skin tends to itch less than dry skin. - Add on Zyrtec (cetirizine) 10 mg nightly to help with itching.  3. Anaphylactic shock due to food (seafood) - We are not going to get testing to look at seafood allergy levels. - We we will contact you in 1 to 2 weeks with the results of the testing. - We can send an EpiPen if you are interested, otherwise we can wait until the testing gets back.  4. Seasonal allergic rhinitis - Since we are getting blood, I will just add on an environmental allergy panel via the blood. - The addition of Zyrtec (cetirizine) should help with any allergy symptoms 2.  5. Return in about 3 months (around 11/06/2020).    This note in its entirety was forwarded to the Provider who requested this consultation.  Subjective:   Casey [name] is a 55 y.o. male presenting today for evaluation of  Chief Complaint  Patient presents with   Allergies    History of PCN allergy and fish   Eczema    legs    Maxwel Meadowcroft has a history of the following: Patient Active Problem List   Diagnosis Date Noted   Encounter for general adult medical examination with  abnormal findings 07/18/2020   Prediabetes 07/18/2020   Primary hypertension 07/18/2020   Hearing decreased, left 11/25/2018   Patellofemoral pain syndrome of both knees 09/19/2018   Eczema 04/06/2015    History obtained from: chart review and patient.  Casey [name] was referred by Janith Lima, MD.     Casey [name] is a 55 y.o. male presenting for an evaluation of multiple atopic complaints .   Allergic Rhinitis Symptom History: He does report some seasonal allergic rhinitis symptoms that affect his nose.  He will occasionally have some sneezing.  This happens during just a couple of weeks of the year and most of the time he does not even take anything for it.  He has never been allergy tested.  He does not get recurrent sinus infections.  Food Allergy Symptom History: He does report some throat fullness with seafood.  This happened initially when he was a child probably around 10 or 84 years of age.  He has noticed over time that even when he handles raw fish he will start breaking out in a rash on his hands.  He has never been tested.  He has never needed to use his EpiPen.  In fact, it does not seem that he has ever had an EpiPen.  Eczema Symptom History: He does have some eczema on his bilateral legs.  He went to see a dermatologist who gave him  betamethasone to use as needed.  He does not do this as often as he should.  He does not moisturize on a routine basis.  It does not seem to change whether he wears pants or shorts.  He has never undergone patch testing.  He only saw the dermatologist once.  Drug Allergy Symptom History: One of his main complaints today is his penicillin allergy.  He tells me he does not remember ever having reacted to penicillin, but his mother always told him that he was allergic.  He asked his mom and apparently it was just a rash near the end of a course of amoxicillin when he was very young, likely around 6 or 7.  He has been avoiding penicillins for his entire life  now.  Otherwise, there is no history of other atopic diseases, including asthma, stinging insect allergies, urticaria, or contact dermatitis. There is no significant infectious history. Vaccinations are up to date.    Past Medical History: Patient Active Problem List   Diagnosis Date Noted   Encounter for general adult medical examination with abnormal findings 07/18/2020   Prediabetes 07/18/2020   Primary hypertension 07/18/2020   Hearing decreased, left 11/25/2018   Patellofemoral pain syndrome of both knees 09/19/2018   Eczema 04/06/2015    Medication List:  Allergies as of 08/06/2020       Reactions   Fish Allergy Rash   Penicillins Rash   REACTION: had reaction as an infant--cannot recall reaction        Medication List        Accurate as of August 06, 2020  1:30 PM. If you have any questions, ask your nurse or doctor.          betamethasone dipropionate 0.05 % cream Apply topically 2 (two) times daily.   diclofenac 75 MG EC tablet Commonly known as: VOLTAREN TAKE 1 TABLET(75 MG) BY MOUTH TWICE DAILY   EPINEPHrine 0.3 mg/0.3 mL Soaj injection Commonly known as: EpiPen 2-Pak Inject 0.3 mg into the muscle once for 1 dose. Started by: Valentina Shaggy, MD   lisinopril 40 MG tablet Commonly known as: ZESTRIL Take 1 tablet (40 mg total) by mouth daily.        Birth History: non-contributory  Developmental History: non-contributory  Past Surgical History: Past Surgical History:  Procedure Laterality Date   APPENDECTOMY  2005   EXTRACORPOREAL SHOCK WAVE LITHOTRIPSY Left 01/27/2018   Procedure: EXTRACORPOREAL SHOCK WAVE LITHOTRIPSY (ESWL);  Surgeon: Bjorn Loser, MD;  Location: [facility];  Service: Urology;  Laterality: Left;   WISDOM TOOTH EXTRACTION       Family History: Family History  Problem Relation Age of Onset   Allergic rhinitis Mother    Stroke Mother    Alcohol abuse Mother    Diabetes Mother    COPD Mother    Asthma Father         childhood   Hypertension Father    Bladder Cancer Father    Alcohol abuse Father    CVA Father    Lupus Maternal Aunt    COPD Maternal Grandmother    Colon cancer Neg Hx    Eczema Neg Hx    Urticaria Neg Hx    Angioedema Neg Hx      Social History: Marlee lives at home in an apartment that is 53+ years old.  There might be some mildew damage in the home.  There are hardwoods throughout the home.  There is gas heating and central cooling.  There are no dust mite covers on the bedding.  There is no tobacco exposure.  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  He has been there for 15 years.  There is no fume, chemical, or dust exposure.  He was a smoker from 40 through 2017.  He smoked 2 packs/day at the time. He carries no diagnosis of COPD.   Review of Systems  Constitutional: Negative.  Negative for chills, fever, malaise/fatigue and weight loss.  HENT:  Positive for congestion. Negative for ear discharge and ear pain.        Positive for postnasal drip.  Eyes:  Negative for pain, discharge and redness.  Respiratory:  Negative for cough, sputum production, shortness of breath and wheezing.   Cardiovascular: Negative.  Negative for chest pain and palpitations.  Gastrointestinal:  Negative for abdominal pain, constipation, diarrhea, heartburn, nausea and vomiting.  Skin: Negative.  Negative for itching and rash.  Neurological:  Negative for dizziness and headaches.  Endo/Heme/Allergies:  Positive for environmental allergies. Does not bruise/bleed easily.       Positive for possible food allergies.      Objective:   Blood pressure 120/74, pulse 74, temperature 97.9 F (36.6 C), temperature source Temporal, height 6' 1"  (1.854 m), weight 252 lb (114.3 kg), SpO2 99 %. Body mass index is 33.25 kg/m.   Physical Exam:   Physical Exam Constitutional:      Appearance: He is well-developed.  HENT:     Head:  Normocephalic and atraumatic.     Right Ear: Tympanic membrane, ear canal and external ear normal. No drainage, swelling or tenderness. Tympanic membrane is not injected, scarred, erythematous, retracted or bulging.     Left Ear: Tympanic membrane, ear canal and external ear normal. No drainage, swelling or tenderness. Tympanic membrane is not injected, scarred, erythematous, retracted or bulging.     Nose: Rhinorrhea present. No nasal deformity, septal deviation or mucosal edema.     Right Turbinates: Enlarged.     Left Turbinates: Enlarged.     Right Sinus: No maxillary sinus tenderness or frontal sinus tenderness.     Left Sinus: No maxillary sinus tenderness or frontal sinus tenderness.     Mouth/Throat:     Mouth: Mucous membranes are not pale and not dry.     Pharynx: Uvula midline.  Eyes:     General: Allergic shiner present.        Right eye: No discharge.        Left eye: No discharge.     Conjunctiva/sclera: Conjunctivae normal.     Right eye: Right conjunctiva is not injected. No chemosis.    Left eye: Left conjunctiva is not injected. No chemosis.    Pupils: Pupils are equal, round, and reactive to light.  Cardiovascular:     Rate and Rhythm: Normal rate and regular rhythm.     Heart sounds: Normal heart sounds.  Pulmonary:     Effort: Pulmonary effort is normal. No tachypnea, accessory muscle usage or respiratory distress.     Breath sounds: Normal breath sounds. No wheezing, rhonchi or rales.     Comments: Moving air well in all lung fields. Chest:     Chest wall: No tenderness.  Abdominal:     Tenderness: There is no abdominal tenderness. There is no guarding or rebound.  Lymphadenopathy:     Head:     Right side of head: No submandibular, tonsillar or occipital adenopathy.  Left side of head: No submandibular, tonsillar or occipital adenopathy.     Cervical: No cervical adenopathy.  Skin:    General: Skin is warm.     Capillary Refill: Capillary refill takes  less than 2 seconds.     Coloration: Skin is not pale.     Findings: Rash present. No abrasion, erythema or petechiae. Rash is not papular, urticarial or vesicular.     Comments: Eczematous lesions over the bilateral legs.  There are prominent in the popliteal fossa.  There is no honey crusting or oozing.  He does have excoriations present.  Neurological:     Mental Status: He is alert.     Diagnostic studies:   Allergy Studies:     Oral Challenge - 08/06/20 1200     Challenge Food/Drug penicillin oral challenge    Food/Drug provided by office    BP 120/74    Pulse 74    Lungs 99%    Skin clear    Mouth clear    Time 0922    Dose 1 ml    Lungs clear    Skin clear    Mouth clear    Time 0953    Dose 10 ml    Lungs clear    Skin clear    Mouth clear             Allergy testing results were read and interpreted by myself, documented by clinical staff.         Salvatore Marvel, MD [facility]

## 2020-08-07 ENCOUNTER — Encounter: Payer: Self-pay | Admitting: Allergy & Immunology

## 2020-08-08 ENCOUNTER — Other Ambulatory Visit: Payer: Self-pay | Admitting: Internal Medicine

## 2020-08-08 DIAGNOSIS — I10 Essential (primary) hypertension: Secondary | ICD-10-CM

## 2020-08-08 MED ORDER — LISINOPRIL 40 MG PO TABS
40.0000 mg | ORAL_TABLET | Freq: Every day | ORAL | 1 refills | Status: DC
Start: 2020-08-08 — End: 2021-02-01

## 2020-08-12 LAB — ALLERGENS W/COMP RFLX AREA 2
Alternaria Alternata IgE: <0.1 kU/L
Aspergillus Fumigatus IgE: <0.1 kU/L
Bermuda Grass IgE: <0.1 kU/L
Cedar, Mountain IgE: <0.1 kU/L
Cladosporium Herbarum IgE: <0.1 kU/L
Cockroach, German IgE: <0.1 kU/L
Common Silver Birch IgE: <0.1 kU/L
Cottonwood IgE: <0.1 kU/L
D Farinae IgE: <0.1 kU/L
D Pteronyssinus IgE: <0.1 kU/L
E001-IgE Cat Dander: <0.1 kU/L
E005-IgE Dog Dander: <0.1 kU/L
Elm, American IgE: <0.1 kU/L
IgE (Immunoglobulin E), Serum: 97 IU/mL (ref 6–495)
Johnson Grass IgE: <0.1 kU/L
Maple/Box Elder IgE: <0.1 kU/L
Mouse Urine IgE: <0.1 kU/L
Oak, White IgE: <0.1 kU/L
Pecan, Hickory IgE: <0.1 kU/L
Penicillium Chrysogen IgE: <0.1 kU/L
Pigweed, Rough IgE: <0.1 kU/L
Ragweed, Short IgE: <0.1 kU/L
Sheep Sorrel IgE Qn: <0.1 kU/L
Timothy Grass IgE: <0.1 kU/L
White Mulberry IgE: <0.1 kU/L

## 2020-08-12 LAB — ALLERGY PANEL 19, SEAFOOD GROUP
Allergen Salmon IgE: <0.1 kU/L
Catfish: <0.1 kU/L
Codfish IgE: <0.1 kU/L
F023-IgE Crab: <0.1 kU/L
F080-IgE Lobster: <0.1 kU/L
Shrimp IgE: <0.1 kU/L
Tuna: <0.1 kU/L

## 2020-09-24 ENCOUNTER — Encounter (HOSPITAL_BASED_OUTPATIENT_CLINIC_OR_DEPARTMENT_OTHER): Payer: Self-pay | Admitting: Urology

## 2020-09-25 ENCOUNTER — Other Ambulatory Visit: Payer: Self-pay

## 2020-09-25 ENCOUNTER — Encounter (HOSPITAL_BASED_OUTPATIENT_CLINIC_OR_DEPARTMENT_OTHER): Payer: Self-pay | Admitting: Urology

## 2020-09-25 NOTE — Progress Notes (Signed)
Spoke w/ via phone for pre-op interview--- Pt Lab needs dos----  Istat and EKG             Lab results------ no COVID test -----patient states asymptomatic no test needed Arrive at ------- 1015 on 09-27-2020 NPO after MN NO Solid Food.  Clear liquids from MN until--- 0915 Med rec completed Medications to take morning of surgery -----NONE Diabetic medication ----- n/a Patient instructed no nail polish to be worn day of surgery Patient instructed to bring photo id and insurance card day of surgery Patient aware to have Driver (ride ) / caregiver for 24 hours after surgery --friend, Insurance risk surveyor Patient Special Instructions -----n/a Pre-Op special Istructions ----- n/a Patient verbalized understanding of instructions that were given at this phone interview. Patient denies shortness of breath, chest pain, fever, cough at this phone interview.

## 2020-09-27 ENCOUNTER — Ambulatory Visit (HOSPITAL_BASED_OUTPATIENT_CLINIC_OR_DEPARTMENT_OTHER): Payer: BC Managed Care – PPO | Admitting: Certified Registered"

## 2020-09-27 ENCOUNTER — Encounter (HOSPITAL_BASED_OUTPATIENT_CLINIC_OR_DEPARTMENT_OTHER): Payer: Self-pay | Admitting: Urology

## 2020-09-27 ENCOUNTER — Encounter (HOSPITAL_BASED_OUTPATIENT_CLINIC_OR_DEPARTMENT_OTHER)
Admission: RE | Disposition: A | Discharge status: Home / Self Care | Payer: Self-pay | Source: Home / Self Care | Attending: Urology

## 2020-09-27 ENCOUNTER — Ambulatory Visit (HOSPITAL_BASED_OUTPATIENT_CLINIC_OR_DEPARTMENT_OTHER)
Admission: RE | Admit: 2020-09-27 | Discharge: 2020-09-27 | Disposition: A | Discharge status: Home / Self Care | Payer: BC Managed Care – PPO | Attending: Urology | Admitting: Urology

## 2020-09-27 DIAGNOSIS — Z91013 Allergy to seafood: Secondary | ICD-10-CM | POA: Diagnosis not present

## 2020-09-27 DIAGNOSIS — Z825 Family history of asthma and other chronic lower respiratory diseases: Secondary | ICD-10-CM | POA: Diagnosis not present

## 2020-09-27 DIAGNOSIS — K219 Gastro-esophageal reflux disease without esophagitis: Secondary | ICD-10-CM | POA: Diagnosis not present

## 2020-09-27 DIAGNOSIS — E669 Obesity, unspecified: Secondary | ICD-10-CM | POA: Diagnosis not present

## 2020-09-27 DIAGNOSIS — Z8052 Family history of malignant neoplasm of bladder: Secondary | ICD-10-CM | POA: Diagnosis not present

## 2020-09-27 DIAGNOSIS — Z833 Family history of diabetes mellitus: Secondary | ICD-10-CM | POA: Insufficient documentation

## 2020-09-27 DIAGNOSIS — Z791 Long term (current) use of non-steroidal anti-inflammatories (NSAID): Secondary | ICD-10-CM | POA: Diagnosis not present

## 2020-09-27 DIAGNOSIS — Z8249 Family history of ischemic heart disease and other diseases of the circulatory system: Secondary | ICD-10-CM | POA: Diagnosis not present

## 2020-09-27 DIAGNOSIS — Z823 Family history of stroke: Secondary | ICD-10-CM | POA: Diagnosis not present

## 2020-09-27 DIAGNOSIS — I10 Essential (primary) hypertension: Secondary | ICD-10-CM

## 2020-09-27 DIAGNOSIS — D090 Carcinoma in situ of bladder: Secondary | ICD-10-CM | POA: Diagnosis not present

## 2020-09-27 DIAGNOSIS — C672 Malignant neoplasm of lateral wall of bladder: Secondary | ICD-10-CM | POA: Diagnosis not present

## 2020-09-27 DIAGNOSIS — Z6831 Body mass index (BMI) 31.0-31.9, adult: Secondary | ICD-10-CM | POA: Insufficient documentation

## 2020-09-27 DIAGNOSIS — D494 Neoplasm of unspecified behavior of bladder: Secondary | ICD-10-CM | POA: Diagnosis not present

## 2020-09-27 DIAGNOSIS — Z87442 Personal history of urinary calculi: Secondary | ICD-10-CM | POA: Diagnosis not present

## 2020-09-27 DIAGNOSIS — Z87891 Personal history of nicotine dependence: Secondary | ICD-10-CM | POA: Diagnosis not present

## 2020-09-27 DIAGNOSIS — R7303 Prediabetes: Secondary | ICD-10-CM | POA: Diagnosis not present

## 2020-09-27 DIAGNOSIS — D414 Neoplasm of uncertain behavior of bladder: Secondary | ICD-10-CM | POA: Diagnosis not present

## 2020-09-27 HISTORY — DX: Gastro-esophageal reflux disease without esophagitis: K21.9

## 2020-09-27 HISTORY — DX: Personal history of colonic polyps: Z86.010

## 2020-09-27 HISTORY — DX: Unspecified right bundle-branch block: I45.10

## 2020-09-27 HISTORY — DX: Other seasonal allergic rhinitis: J30.2

## 2020-09-27 HISTORY — DX: Presence of spectacles and contact lenses: Z97.3

## 2020-09-27 HISTORY — DX: Prediabetes: R73.03

## 2020-09-27 HISTORY — DX: Personal history of adenomatous and serrated colon polyps: Z86.0101

## 2020-09-27 HISTORY — DX: Neoplasm of unspecified behavior of bladder: D49.4

## 2020-09-27 HISTORY — PX: TRANSURETHRAL RESECTION OF BLADDER TUMOR WITH MITOMYCIN-C: SHX6459

## 2020-09-27 HISTORY — DX: Obstructive sleep apnea (adult) (pediatric): G47.33

## 2020-09-27 HISTORY — DX: Nocturia: R35.1

## 2020-09-27 LAB — POCT I-STAT, CHEM 8
BUN: 12 mg/dL (ref 6–20)
Calcium, Ion: 1.31 mmol/L (ref 1.15–1.40)
Chloride: 104 mmol/L (ref 98–111)
Creatinine, Ser: 0.8 mg/dL (ref 0.61–1.24)
Glucose, Bld: 145 mg/dL — ABNORMAL HIGH (ref 70–99)
HCT: 46 % (ref 39.0–52.0)
Hemoglobin: 15.6 g/dL (ref 13.0–17.0)
Potassium: 4 mmol/L (ref 3.5–5.1)
Sodium: 140 mmol/L (ref 135–145)
TCO2: 23 mmol/L (ref 22–32)

## 2020-09-27 SURGERY — TRANSURETHRAL RESECTION OF BLADDER TUMOR WITH MITOMYCIN-C
Anesthesia: General | Site: Pelvis | Laterality: Bilateral

## 2020-09-27 MED ORDER — ONDANSETRON HCL 4 MG/2ML IJ SOLN
INTRAMUSCULAR | Status: AC
  Filled 2020-09-27: qty 2

## 2020-09-27 MED ORDER — FENTANYL CITRATE (PF) 100 MCG/2ML IJ SOLN
INTRAMUSCULAR | Status: AC
  Filled 2020-09-27: qty 2

## 2020-09-27 MED ORDER — MIDAZOLAM HCL 2 MG/2ML IJ SOLN
INTRAMUSCULAR | Status: AC
  Filled 2020-09-27: qty 2

## 2020-09-27 MED ORDER — LACTATED RINGERS IV SOLN: INTRAVENOUS | Status: DC

## 2020-09-27 MED ORDER — ONDANSETRON HCL 4 MG/2ML IJ SOLN: 4.0000 mg | Freq: Once | INTRAMUSCULAR | Status: DC | PRN

## 2020-09-27 MED ORDER — CIPROFLOXACIN IN D5W 400 MG/200ML IV SOLN
INTRAVENOUS | Status: AC
  Filled 2020-09-27: qty 200

## 2020-09-27 MED ORDER — IOHEXOL 300 MG/ML  SOLN
INTRAMUSCULAR | Status: DC | PRN
  Administered 2020-09-27: 10 mL via URETHRAL

## 2020-09-27 MED ORDER — FENTANYL CITRATE (PF) 100 MCG/2ML IJ SOLN
INTRAMUSCULAR | Status: DC | PRN
  Administered 2020-09-27 (×2): 50 ug via INTRAVENOUS

## 2020-09-27 MED ORDER — MIDAZOLAM HCL 2 MG/2ML IJ SOLN
INTRAMUSCULAR | Status: DC | PRN
  Administered 2020-09-27: 2 mg via INTRAVENOUS

## 2020-09-27 MED ORDER — PROPOFOL 10 MG/ML IV BOLUS
INTRAVENOUS | Status: AC
  Filled 2020-09-27: qty 20

## 2020-09-27 MED ORDER — ROCURONIUM BROMIDE 10 MG/ML (PF) SYRINGE
PREFILLED_SYRINGE | INTRAVENOUS | Status: DC | PRN
  Administered 2020-09-27: 50 mg via INTRAVENOUS

## 2020-09-27 MED ORDER — LIDOCAINE HCL (PF) 2 % IJ SOLN
INTRAMUSCULAR | Status: AC
  Filled 2020-09-27: qty 5

## 2020-09-27 MED ORDER — SODIUM CHLORIDE 0.9 % IR SOLN
Status: DC | PRN
  Administered 2020-09-27: 3000 mL via INTRAVESICAL

## 2020-09-27 MED ORDER — DEXAMETHASONE SODIUM PHOSPHATE 10 MG/ML IJ SOLN
INTRAMUSCULAR | Status: AC
  Filled 2020-09-27: qty 1

## 2020-09-27 MED ORDER — LIDOCAINE 2% (20 MG/ML) 5 ML SYRINGE
INTRAMUSCULAR | Status: DC | PRN
  Administered 2020-09-27: 100 mg via INTRAVENOUS

## 2020-09-27 MED ORDER — HYDROCODONE-ACETAMINOPHEN 5-325 MG PO TABS: 1.0000 | ORAL_TABLET | ORAL | 0 refills | Status: DC | PRN

## 2020-09-27 MED ORDER — ROCURONIUM BROMIDE 10 MG/ML (PF) SYRINGE
PREFILLED_SYRINGE | INTRAVENOUS | Status: AC
  Filled 2020-09-27: qty 10

## 2020-09-27 MED ORDER — FENTANYL CITRATE (PF) 100 MCG/2ML IJ SOLN: 25.0000 ug | INTRAMUSCULAR | Status: DC | PRN

## 2020-09-27 MED ORDER — SUGAMMADEX SODIUM 200 MG/2ML IV SOLN
INTRAVENOUS | Status: DC | PRN
  Administered 2020-09-27: 250 mg via INTRAVENOUS

## 2020-09-27 MED ORDER — GEMCITABINE CHEMO FOR BLADDER INSTILLATION 2000 MG
2000.0000 mg | Freq: Once | INTRAVENOUS | Status: AC
  Administered 2020-09-27: 2000 mg via INTRAVESICAL
  Filled 2020-09-27: qty 2000

## 2020-09-27 MED ORDER — WHITE PETROLATUM EX OINT
TOPICAL_OINTMENT | CUTANEOUS | Status: AC
  Filled 2020-09-27: qty 5

## 2020-09-27 MED ORDER — SUGAMMADEX SODIUM 500 MG/5ML IV SOLN
INTRAVENOUS | Status: AC
  Filled 2020-09-27: qty 5

## 2020-09-27 MED ORDER — OXYCODONE HCL 5 MG PO TABS
5.0000 mg | ORAL_TABLET | Freq: Once | ORAL | Status: DC | PRN
Start: 2020-09-27 — End: 2020-09-27

## 2020-09-27 MED ORDER — ONDANSETRON HCL 4 MG/2ML IJ SOLN
INTRAMUSCULAR | Status: DC | PRN
  Administered 2020-09-27: 4 mg via INTRAVENOUS

## 2020-09-27 MED ORDER — DEXAMETHASONE SODIUM PHOSPHATE 10 MG/ML IJ SOLN
INTRAMUSCULAR | Status: DC | PRN
  Administered 2020-09-27: 5 mg via INTRAVENOUS

## 2020-09-27 MED ORDER — OXYCODONE HCL 5 MG/5ML PO SOLN: 5.0000 mg | Freq: Once | ORAL | Status: DC | PRN

## 2020-09-27 MED ORDER — PROPOFOL 10 MG/ML IV BOLUS
INTRAVENOUS | Status: DC | PRN
  Administered 2020-09-27: 180 mg via INTRAVENOUS

## 2020-09-27 MED ORDER — CIPROFLOXACIN IN D5W 400 MG/200ML IV SOLN
400.0000 mg | INTRAVENOUS | Status: AC
  Administered 2020-09-27: 400 mg via INTRAVENOUS

## 2020-09-27 SURGICAL SUPPLY — 25 items
BAG DRAIN URO-CYSTO SKYTR STRL (DRAIN) ×2 IMPLANT
BAG URINE DRAIN 2000ML AR STRL (UROLOGICAL SUPPLIES) ×2 IMPLANT
BAG URINE LEG 500ML (DRAIN) IMPLANT
CATH FOLEY 2WAY SLVR  5CC 18FR (CATHETERS) ×2
CATH FOLEY 2WAY SLVR  5CC 22FR (CATHETERS)
CATH FOLEY 2WAY SLVR 30CC 20FR (CATHETERS) IMPLANT
CATH FOLEY 2WAY SLVR 5CC 18FR (CATHETERS) ×1 IMPLANT
CATH FOLEY 2WAY SLVR 5CC 22FR (CATHETERS) IMPLANT
CATH INTERMIT  6FR 70CM (CATHETERS) ×2 IMPLANT
CLOTH BEACON ORANGE TIMEOUT ST (SAFETY) ×2 IMPLANT
ELECT REM PT RETURN 9FT ADLT (ELECTROSURGICAL)
ELECTRODE REM PT RTRN 9FT ADLT (ELECTROSURGICAL) IMPLANT
EVACUATOR MICROVAS BLADDER (UROLOGICAL SUPPLIES) IMPLANT
GLOVE SURG ENC MOIS LTX SZ7.5 (GLOVE) ×2 IMPLANT
GOWN STRL REUS W/TWL LRG LVL3 (GOWN DISPOSABLE) ×2 IMPLANT
HOLDER FOLEY CATH W/STRAP (MISCELLANEOUS) ×2 IMPLANT
IV NS IRRIG 3000ML ARTHROMATIC (IV SOLUTION) ×2 IMPLANT
KIT TURNOVER CYSTO (KITS) ×2 IMPLANT
LOOP CUT BIPOLAR 24F LRG (ELECTROSURGICAL) ×2 IMPLANT
MANIFOLD NEPTUNE II (INSTRUMENTS) ×2 IMPLANT
PACK CYSTO (CUSTOM PROCEDURE TRAY) ×2 IMPLANT
SYR TOOMEY IRRIG 70ML (MISCELLANEOUS) ×2
SYRINGE TOOMEY IRRIG 70ML (MISCELLANEOUS) ×1 IMPLANT
TUBE CONNECTING 12X1/4 (SUCTIONS) ×2 IMPLANT
TUBING UROLOGY SET (TUBING) ×2 IMPLANT

## 2020-09-27 NOTE — Transfer of Care (Signed)
Immediate Anesthesia Transfer of Care Note  Patient: Ashvik Grundman  Procedure(s) Performed: Procedure(s) (LRB): TRANSURETHRAL RESECTION OF BLADDER TUMOR WITH GEMCITABINE BILATERAL RETROGRADE PYELOGRAM (Bilateral)  Patient Location: PACU  Anesthesia Type: General  Level of Consciousness: awake, oriented, sedated and patient cooperative  Airway & Oxygen Therapy: Patient Spontanous Breathing and Patient connected to face mask oxygen  Post-op Assessment: Report given to PACU RN and Post -op Vital signs reviewed and stable  Post vital signs: Reviewed and stable  Complications: No apparent anesthesia complications Last Vitals:  Vitals Value Taken Time  BP 132/95 09/27/20 1303  Temp 36.3 C 09/27/20 1304  Pulse 79 09/27/20 1306  Resp 16 09/27/20 1306  SpO2 99 % 09/27/20 1306  Vitals shown include unvalidated device data.  Last Pain:  Vitals:   09/27/20 1013  TempSrc: Oral  PainSc: 0-No pain      Patients Stated Pain Goal: 6 (92/90/90 3014)  Complications: No notable events documented.

## 2020-09-27 NOTE — Discharge Instructions (Addendum)
Transurethral Resection of Bladder Tumor (TURBT) or Bladder Biopsy   Definition:  Transurethral Resection of the Bladder Tumor is a surgical procedure used to diagnose and remove tumors within the bladder. TURBT is the most common treatment for early stage bladder cancer.  General instructions:     Your recent bladder surgery requires very little post hospital care but some definite precautions.  Despite the fact that no skin incisions were used, the area around the bladder incisions are raw and covered with scabs to promote healing and prevent bleeding. Certain precautions are needed to insure that the scabs are not disturbed over the next 2-4 weeks while the healing proceeds.  Because the raw surface inside your bladder and the irritating effects of urine you may expect frequency of urination and/or urgency (a stronger desire to urinate) and perhaps even getting up at night more often. This will usually resolve or improve slowly over the healing period. You may see some blood in your urine over the first 6 weeks. Do not be alarmed, even if the urine was clear for a while. Get off your feet and drink lots of fluids until clearing occurs. If you start to pass clots or don't improve call us.  Diet:  You may return to your normal diet immediately. Because of the raw surface of your bladder, alcohol, spicy foods, foods high in acid and drinks with caffeine may cause irritation or frequency and should be used in moderation. To keep your urine flowing freely and avoid constipation, drink plenty of fluids during the day (8-10 glasses). Tip: Avoid cranberry juice because it is very acidic.  Activity:  Your physical activity doesn't need to be restricted. However, if you are very active, you may see some blood in the urine. We suggest that you reduce your activity under the circumstances until the bleeding has stopped.  Bowels:  It is important to keep your bowels regular during the postoperative  period. Straining with bowel movements can cause bleeding. A bowel movement every other day is reasonable. Use a mild laxative if needed, such as milk of magnesia 2-3 tablespoons, or 2 Dulcolax tablets. Call if you continue to have problems. If you had been taking narcotics for pain, before, during or after your surgery, you may be constipated. Take a laxative if necessary.    Medication:  You should resume your pre-surgery medications unless told not to. In addition you may be given an antibiotic to prevent or treat infection. Antibiotics are not always necessary. All medication should be taken as prescribed until the bottles are finished unless you are having an unusual reaction to one of the drugs.   Post Anesthesia Home Care Instructions  Activity: Get plenty of rest for the remainder of the day. A responsible individual must stay with you for 24 hours following the procedure.  For the next 24 hours, DO NOT: -Drive a car -Paediatric nurse -Drink alcoholic beverages -Take any medication unless instructed by your physician -Make any legal decisions or sign important papers.  Meals: Start with liquid foods such as gelatin or soup. Progress to regular foods as tolerated. Avoid greasy, spicy, heavy foods. If nausea and/or vomiting occur, drink only clear liquids until the nausea and/or vomiting subsides. Call your physician if vomiting continues.  Special Instructions/Symptoms: Your throat may feel dry or sore from the anesthesia or the breathing tube placed in your throat during surgery. If this causes discomfort, gargle with warm salt water. The discomfort should disappear within 24 hours.

## 2020-09-27 NOTE — Anesthesia Preprocedure Evaluation (Addendum)
Anesthesia Evaluation  Patient identified by MRN, date of birth, ID band Patient awake    Reviewed: Allergy & Precautions, NPO status , Patient's Chart, lab work & pertinent test results, reviewed documented beta blocker date and time   Airway Mallampati: II  TM Distance: >3 FB Neck ROM: Full    Dental  (+) Dental Advisory Given, Caps, Partial Upper,    Pulmonary sleep apnea and Continuous Positive Airway Pressure Ventilation , former smoker,    Pulmonary exam normal breath sounds clear to auscultation       Cardiovascular hypertension, Pt. on medications Normal cardiovascular exam+ dysrhythmias  Rhythm:Regular Rate:Normal  EKG 09/27/20 NSR, RBBB pattern   Neuro/Psych negative neurological ROS  negative psych ROS   GI/Hepatic Neg liver ROS, GERD  Medicated,  Endo/Other  Obesity Pre diabetes  Renal/GU negative Renal ROSHx/o renal calculi   Bladder tumor    Musculoskeletal Hx/o eczema   Abdominal (+) + obese,   Peds  Hematology   Anesthesia Other Findings   Reproductive/Obstetrics                            Anesthesia Physical Anesthesia Plan  ASA: 2  Anesthesia Plan: General   Post-op Pain Management:    Induction: Intravenous  PONV Risk Score and Plan: 4 or greater and Treatment may vary due to age or medical condition, Midazolam, Dexamethasone and Ondansetron  Airway Management Planned: Oral ETT  Additional Equipment:   Intra-op Plan:   Post-operative Plan: Extubation in OR  Informed Consent: I have reviewed the patients History and Physical, chart, labs and discussed the procedure including the risks, benefits and alternatives for the proposed anesthesia with the patient or authorized representative who has indicated his/her understanding and acceptance.     Dental advisory given  Plan Discussed with: CRNA and Anesthesiologist  Anesthesia Plan Comments:         Anesthesia Quick Evaluation

## 2020-09-27 NOTE — Anesthesia Postprocedure Evaluation (Signed)
Anesthesia Post Note  Patient: Casey [name]  Procedure(s) Performed: TRANSURETHRAL RESECTION OF BLADDER TUMOR WITH GEMCITABINE BILATERAL RETROGRADE PYELOGRAM (Bilateral: Pelvis)     Patient location during evaluation: PACU Anesthesia Type: General Level of consciousness: awake and alert and oriented Pain management: pain level controlled Vital Signs Assessment: post-procedure vital signs reviewed and stable Respiratory status: spontaneous breathing, nonlabored ventilation and respiratory function stable Cardiovascular status: blood pressure returned to baseline and stable Postop Assessment: no apparent nausea or vomiting Anesthetic complications: no   No notable events documented.  Last Vitals:  Vitals:   09/27/20 1330 09/27/20 1345  BP: 121/77 (!) 134/93  Pulse: 78 72  Resp: 16 13  Temp:    SpO2: 98% 100%    Last Pain:  Vitals:   09/27/20 1345  TempSrc:   PainSc: 0-No pain                 Modesto Ganoe A.

## 2020-09-27 NOTE — Anesthesia Procedure Notes (Signed)
Procedure Name: Intubation Date/Time: 09/27/2020 12:28 PM Performed by: Suan Halter, CRNA Pre-anesthesia Checklist: Patient identified, Emergency Drugs available, Suction available and Patient being monitored Patient Re-evaluated:Patient Re-evaluated prior to induction Oxygen Delivery Method: Circle system utilized Preoxygenation: Pre-oxygenation with 100% oxygen Induction Type: IV induction Ventilation: Mask ventilation without difficulty Laryngoscope Size: Mac and 4 Grade View: Grade I Tube type: Oral Tube size: 7.5 mm Number of attempts: 1 Airway Equipment and Method: Stylet and Oral airway Placement Confirmation: ETT inserted through vocal cords under direct vision, positive ETCO2 and breath sounds checked- equal and bilateral Secured at: 22 cm Tube secured with: Tape Dental Injury: Teeth and Oropharynx as per pre-operative assessment

## 2020-09-27 NOTE — Interval H&P Note (Signed)
History and Physical Interval Note:  09/27/2020 12:14 PM  Casey [name]  has presented today for surgery, with the diagnosis of BLADDER TUMOR.  The various methods of treatment have been discussed with the patient and family. After consideration of risks, benefits and other options for treatment, the patient has consented to  Procedure(s): TRANSURETHRAL RESECTION OF BLADDER TUMOR WITH GEMCITABINE BILATERAL RETROGRADE PYELOGRAM (Bilateral) as a surgical intervention.  The patient's history has been reviewed, patient examined, no change in status, stable for surgery.  I have reviewed the patient's chart and labs.  Questions were answered to the patient's satisfaction.     Marton Redwood, III

## 2020-09-27 NOTE — Op Note (Signed)
Operative Note  Preoperative diagnosis:  1.  Bladder tumor  Postoperative diagnosis: 1.  Bladder tumor--small  Procedure(s): 1.  Urethral dilation 2.  Transurethral resection of bladder tumor--small 3.  Intravesical instillation of gemcitabine 4.  Bilateral retrograde pyelogram  Surgeon: Link Snuffer, MD  Assistants: None  Anesthesia: General  Complications: None immediate  EBL: Minimal  Specimens: 1.  Bladder tumor  Drains/Catheters: 1.  18 French Foley catheter  Intraoperative findings: 1.  Normal anterior urethra 2.  Borderline obstructing prostate 3.  2 small bladder tumors measuring about 1 cm each on the right posterior lateral wall just lateral to the right ureteral orifice.  No other bladder tumors.  Tumors appeared low-grade and superficial.  They were resected completely.  Bilateral retrograde pyelogram revealed no evidence of filling defects or hydronephrosis.  Indication: 56 year old male with a bladder tumor presents for the previously mentioned operation.  Description of procedure:  The patient was identified and consent was obtained.  The patient was taken to the operating room and placed in the supine position.  The patient was placed under general anesthesia.  Perioperative antibiotics were administered.  The patient was placed in dorsal lithotomy.  Patient was prepped and draped in a standard sterile fashion and a timeout was performed.  A 21 French rigid cystoscope was advanced into the urethra and into the bladder.  The meatus was a little bit tight with this insertion.  I inspected the entire bladder mucosa with the findings noted above.  The left ureter was cannulated with an open-ended ureteral catheter and a retrograde pyelogram was performed.  The same was performed on the right.  I withdrew the scope and sequentially dilated the urethra with sounds from 24 [place] up to 30 [place].  The 67 French resectoscope with a visual obturator in place was then  easily advanced into the urethra and into the bladder.  This was exchanged for the bipolar working element.  The tumors were resected on bipolar settings and resection beds fulgurated.  I collected the specimens for pathology.  I reinspected the bladder mucosa and there was no active bleeding.  No other bladder tumors.  I withdrew the scope and placed an 56 French Foley catheter.  Patient tolerated the procedure well was stable postoperative.  In the PACU, intravesical instillation of gemcitabine was performed.  This remained for proxy 1 hour prior to being discarded.  Plan: Return in 1 week for pathology review

## 2020-09-27 NOTE — H&P (Signed)
H&P  Chief Complaint: Bladder tumors  History of Present Illness: 55 year old male found to have bladder tumors on cystoscopy presents for TURBT with instillation of gemcitabine and bilateral retrograde pyelogram.  Past Medical History:  Diagnosis Date   Bladder tumor    Eczema    GERD (gastroesophageal reflux disease)    History of adenomatous polyp of colon    History of kidney stones    Hypertension    Nocturia    OSA (obstructive sleep apnea)    study in epic 08-02-2018;  followed by dr Rexene Alberts   Pre-diabetes    RBBB (right bundle branch block)    Seasonal allergic rhinitis    Wears glasses    Past Surgical History:  Procedure Laterality Date   COLONOSCOPY WITH PROPOFOL  02/10/2017   EXTRACORPOREAL SHOCK WAVE LITHOTRIPSY Left 01/27/2018   Procedure: EXTRACORPOREAL SHOCK WAVE LITHOTRIPSY (ESWL);  Surgeon: Bjorn Loser, MD;  Location: [facility];  Service: Urology;  Laterality: Left;   LAPAROSCOPIC APPENDECTOMY  08/16/2003   @[facility]    TRANSURETHRAL RESECTION OF BLADDER TUMOR  2020   URETEROSCOPY WITH HOLMIUM LASER LITHOTRIPSY  2020   WISDOM TOOTH EXTRACTION     age 23    Home Medications:  Medications Prior to Admission  Medication Sig Dispense Refill Last Dose   betamethasone dipropionate 0.05 % cream Apply topically 2 (two) times daily. 30 g 2 Past Week   lisinopril (ZESTRIL) 40 MG tablet Take 1 tablet (40 mg total) by mouth daily. (Patient taking differently: Take 40 mg by mouth daily.) 90 tablet 1 09/26/2020   diclofenac (VOLTAREN) 75 MG EC tablet TAKE 1 TABLET(75 MG) BY MOUTH TWICE DAILY (Patient not taking: No sig reported) 30 tablet 0 More than a month   Allergies:  Allergies  Allergen Reactions   Fish Allergy Swelling and Rash    Family History  Problem Relation Age of Onset   Allergic rhinitis Mother    Stroke Mother    Alcohol abuse Mother    Diabetes Mother    COPD Mother    Asthma Father        childhood   Hypertension Father    Bladder Cancer Father     Alcohol abuse Father    CVA Father    Lupus Maternal Aunt    COPD Maternal Grandmother    Colon cancer Neg Hx    Eczema Neg Hx    Urticaria Neg Hx    Angioedema Neg Hx    Social History:  reports that he quit smoking about 4 years ago. His smoking use included cigarettes. He has a 61.25 pack-year smoking history. He has never used smokeless tobacco. He reports previous alcohol use. He reports that he does not use drugs.  ROS: A complete review of systems was performed.  All systems are negative except for pertinent findings as noted. ROS   Physical Exam:  Vital signs in last 24 hours: Temp:  [98.8 F (37.1 C)] 98.8 F (37.1 C) (08/05 1013) Pulse Rate:  [81] 81 (08/05 1013) Resp:  [16] 16 (08/05 1013) BP: (121)/(88) 121/88 (08/05 1013) SpO2:  [98 %] 98 % (08/05 1013) Weight:  [110 kg] 110 kg (08/05 1013) General:  Alert and oriented, No acute distress HEENT: Normocephalic, atraumatic Neck: No JVD or lymphadenopathy Cardiovascular: Regular rate and rhythm Lungs: Regular rate and effort Abdomen: Soft, nontender, nondistended, no abdominal masses Back: No CVA tenderness Extremities: No edema Neurologic: Grossly intact  Laboratory Data:  Results for orders placed or performed during the  hospital encounter of 09/27/20 (from the past 24 hour(s))  I-STAT, chem 8     Status: Abnormal   Collection Time: 09/27/20 10:42 AM  Result Value Ref Range   Sodium 140 135 - 145 mmol/L   Potassium 4.0 3.5 - 5.1 mmol/L   Chloride 104 98 - 111 mmol/L   BUN 12 6 - 20 mg/dL   Creatinine, Ser 0.80 0.61 - 1.24 mg/dL   Glucose, Bld 145 (H) 70 - 99 mg/dL   Calcium, Ion 1.31 1.15 - 1.40 mmol/L   TCO2 23 22 - 32 mmol/L   Hemoglobin 15.6 13.0 - 17.0 g/dL   HCT 46.0 39.0 - 52.0 %   No results found for this or any previous visit (from the past 240 hour(s)). Creatinine: Recent Labs    09/27/20 1042  CREATININE 0.80    Impression/Assessment:  Bladder tumor  Plan:  Proceed with  cystoscopy with bilateral retrograde pyelogram, transurethral resection of bladder tumor with instillation of gemcitabine.  Risk and benefits discussed again.  Casey [name], III 09/27/2020, 12:13 PM

## 2020-09-30 ENCOUNTER — Encounter (HOSPITAL_BASED_OUTPATIENT_CLINIC_OR_DEPARTMENT_OTHER): Payer: Self-pay | Admitting: Urology

## 2020-09-30 LAB — SURGICAL PATHOLOGY

## 2020-10-04 DIAGNOSIS — D414 Neoplasm of uncertain behavior of bladder: Secondary | ICD-10-CM | POA: Diagnosis not present

## 2020-11-07 ENCOUNTER — Other Ambulatory Visit: Payer: Self-pay

## 2020-11-07 ENCOUNTER — Ambulatory Visit: Payer: BC Managed Care – PPO | Admitting: Allergy & Immunology

## 2020-11-07 VITALS — BP 120/80 | HR 83 | Temp 98.2°F | Resp 16 | Ht 73.5 in | Wt 252.2 lb

## 2020-11-07 DIAGNOSIS — T7800XD Anaphylactic reaction due to unspecified food, subsequent encounter: Secondary | ICD-10-CM | POA: Diagnosis not present

## 2020-11-07 DIAGNOSIS — L2089 Other atopic dermatitis: Secondary | ICD-10-CM

## 2020-11-07 DIAGNOSIS — J310 Chronic rhinitis: Secondary | ICD-10-CM

## 2020-11-07 MED ORDER — TRIAMCINOLONE ACETONIDE 0.1 % EX OINT: 1.0000 "application " | TOPICAL_OINTMENT | Freq: Two times a day (BID) | CUTANEOUS | 2 refills | Status: DC | PRN

## 2020-11-07 MED ORDER — TACROLIMUS 0.03 % EX OINT: TOPICAL_OINTMENT | Freq: Two times a day (BID) | CUTANEOUS | 2 refills | Status: DC

## 2020-11-07 NOTE — Progress Notes (Signed)
FOLLOW UP  Date of Service/Encounter:  11/07/20   Assessment:   Adverse effect of drug - passed penicillin challenge at the last visit  Flexural atopic dermatitis - poorly controlled and adding on TAC + Protopic today   Anaphylactic shock due to food (seafood) - negative to skin and labs and now tolerating without a problem   Non-allergic rhinitis   Previous smoker (quit 2017)    History of left bundle branch block - cleared by cardiology  Plan/Recommendations:   1. Anaphylactic shock due to food - You have done well introducing fin fish into your diet.  - Continue to do this.  2. Flexural atopic dermatitis - Continue with moisturizing twice daily as you are doing. - Add on triamcinolone ointment twice daily as needed (do not use on the face) - Add on Protopic twice daily as needed (safe to use over the entire body since it is not a steroid). - Information on Dupixent provided: http://klein-barton.net/  3. Non-allergic rhinitis - Testing today showed: negative to the entire panel. - You do NOT have allergies at all. - Consider using Allegra for both itch control and rhinitis control (this is the least sedating antihistamine and does not cross into the brain like the others).   4. Return in about 3 months (around 02/06/2021).    Subjective:   Casey [name] is a 55 y.o. male presenting today for follow up of  Chief Complaint  Patient presents with   Food Intolerance    Has consumed fish since last visit and has had no issues. Wants to get tested again for everything to see what he is allergic to.     Casey [name] has a history of the following: Patient Active Problem List   Diagnosis Date Noted   Encounter for general adult medical examination with abnormal findings 07/18/2020   Prediabetes 07/18/2020   Primary hypertension 07/18/2020   Hearing decreased, left 11/25/2018   Patellofemoral pain syndrome of both knees 09/19/2018   Eczema  04/06/2015    History obtained from: chart review and patient.  Casey [name] is a 55 y.o. male presenting for a follow up visit.  He was last seen in June 2022.  At that time, we did a penicillin challenge which she passed.  For his atopic dermatitis, we continue with betamethasone as needed.  I also recommended moisturizing.  We added Zyrtec 10 mg nightly.  We obtained labs to look for a seafood allergy.  For his allergic rhinitis, we added on the Zyrtec.  Environmental allergy testing was negative.  Seafood panel was negative.  Since last visit, he has done well.   Allergic Rhinitis Symptom History: He does have hayfever and periods of headaches. He usually has issues in late May or early June where he gets "low grade headaches".  His environmental allergy testing via the bottle was completely negative.  He presents today for skin testing.  Food Allergy Symptom History: He ate salmon and did fine. He really enjoyed it.  He is glad that he is lost his allergy.  Skin Symptom History: He is using betamethasone and Cetaphil. He has never done Protpic or Elidel.  He is not doing the cetirizine every day. It makes him feel free fairly sleepy which limits its use. He has done hydrocortisone with minimal improvement. He likes the creams, but he is not consistent with it.  He needs to be better about moisturizing.  He is unsure if he has any kind of overlying contact dermatitis that  might be making this worse.  He does not see dermatology right now.  He does not think this is a huge quality-of-life issue for him, but he references scratching in the middle of the night.  Otherwise, there have been no changes to his past medical history, surgical history, family history, or social history.    Review of Systems  Constitutional: Negative.  Negative for chills, fever, malaise/fatigue and weight loss.  HENT:  Positive for congestion and sinus pain. Negative for ear discharge and ear pain.   Eyes:  Negative for  pain, discharge and redness.  Respiratory:  Negative for cough, sputum production, shortness of breath and wheezing.   Cardiovascular: Negative.  Negative for chest pain and palpitations.  Gastrointestinal:  Negative for abdominal pain, constipation, diarrhea, heartburn, nausea and vomiting.  Skin:  Positive for itching and rash.  Neurological:  Positive for headaches. Negative for dizziness.  Endo/Heme/Allergies:  Positive for environmental allergies. Does not bruise/bleed easily.      Objective:   Blood pressure 120/80, pulse 83, temperature 98.2 F (36.8 C), temperature source Temporal, resp. rate 16, height 6' 1.5" (1.867 m), weight 252 lb 3.2 oz (114.4 kg), SpO2 98 %. Body mass index is 32.82 kg/m.   Physical Exam:  Physical Exam Constitutional:      Appearance: He is well-developed.  HENT:     Head: Normocephalic and atraumatic.     Right Ear: Tympanic membrane, ear canal and external ear normal.     Left Ear: Tympanic membrane, ear canal and external ear normal.     Nose: No nasal deformity, septal deviation, mucosal edema or rhinorrhea.     Right Turbinates: Not enlarged or swollen.     Left Turbinates: Not enlarged or swollen.     Right Sinus: No maxillary sinus tenderness or frontal sinus tenderness.     Left Sinus: No maxillary sinus tenderness or frontal sinus tenderness.     Mouth/Throat:     Mouth: Mucous membranes are not pale and not dry.     Pharynx: Uvula midline.  Eyes:     General: Lids are normal. No allergic shiner.       Right eye: No discharge.        Left eye: No discharge.     Conjunctiva/sclera: Conjunctivae normal.     Right eye: Right conjunctiva is not injected. No chemosis.    Left eye: Left conjunctiva is not injected. No chemosis.    Pupils: Pupils are equal, round, and reactive to light.  Cardiovascular:     Rate and Rhythm: Normal rate and regular rhythm.     Heart sounds: Normal heart sounds.  Pulmonary:     Effort: Pulmonary effort  is normal. No tachypnea, accessory muscle usage or respiratory distress.     Breath sounds: Normal breath sounds. No wheezing, rhonchi or rales.  Chest:     Chest wall: No tenderness.  Lymphadenopathy:     Cervical: No cervical adenopathy.  Skin:    General: Skin is warm.     Capillary Refill: Capillary refill takes less than 2 seconds.     Coloration: Skin is not pale.     Findings: Rash present. No abrasion, erythema or petechiae. Rash is not papular, urticarial or vesicular.     Comments: Eczematous rash on the bilateral arms with some areas that appear more consistent with contact dermatitis with some healing blisters.  He also has several excoriations in his lower extremities with erythematous eczematous patches.  Neurological:  Mental Status: He is alert.  Psychiatric:        Behavior: Behavior is cooperative.     Diagnostic studies:   Allergy Studies:     Intradermal - 11/07/20 1108     Time Antigen Placed 1108    Allergen Manufacturer Greer    Location Arm    Number of Test 15    Control Negative    [place] Negative    Johnson Negative    7 Grass Negative    Ragweed mix Negative    Weed mix Negative    Tree mix Negative    Mold 1 Negative    Mold 2 Negative    Mold 3 Negative    Mold 4 Negative    Cat Negative    Dog Negative    Cockroach Negative    Mite mix Negative             Allergy testing results were read and interpreted by myself, documented by clinical staff.      Salvatore Marvel, MD  [facility] and [place] [facility] [place]

## 2020-11-07 NOTE — Patient Instructions (Addendum)
1. Anaphylactic shock due to food - You have done well introducing fin fish into your diet.  - Continue to do this.  2. Flexural atopic dermatitis - Continue with moisturizing twice daily as you are doing. - Add on triamcinolone ointment twice daily as needed (do not use on the face) - Add on Protopic twice daily as needed (safe to use over the entire body since it is not a steroid). - Information on Dupixent provided: http://klein-barton.net/  3. Non-allergic rhinitis - Testing today showed: negative to the entire panel. - You do NOT have allergies at all. - Consider using Allegra for both itch control and rhinitis control (this is the least sedating antihistamine and does not cross into the brain like the others).   4. Return in about 3 months (around 02/06/2021).    Please inform us of any Emergency Department visits, hospitalizations, or changes in symptoms. Call us before going to the ED for breathing or allergy symptoms since we might be able to fit you in for a sick visit. Feel free to contact us anytime with any questions, problems, or concerns.  It was a pleasure to see you again today!  Websites that have reliable patient information: 1. American Academy of Asthma, Allergy, and Immunology: www.aaaai.org 2. Food Allergy Research and Education (FARE): foodallergy.org 3. Mothers of Asthmatics: http://www.asthmacommunitynetwork.org 4. American College of Allergy, Asthma, and Immunology: www.acaai.org   COVID-19 Vaccine Information can be found at: ShippingScam.co.uk For questions related to vaccine distribution or appointments, please email [email or web address] or call [phone].   We realize that you might be concerned about having an allergic reaction to the COVID19 vaccines. To help with that concern, WE ARE OFFERING THE COVID19 VACCINES IN OUR OFFICE! Ask the front desk for dates!     "Like"  [place] on Facebook and Instagram for our latest updates!      A healthy democracy works best when [employer] participate! Make sure you are registered to vote! If you have moved or changed any of your contact information, you will need to get this updated before voting!  In some cases, you MAY be able to register to vote online: [email or web address]

## 2020-11-08 ENCOUNTER — Encounter: Payer: Self-pay | Admitting: Allergy & Immunology

## 2020-11-14 ENCOUNTER — Telehealth: Payer: Self-pay | Admitting: Allergy & Immunology

## 2020-11-14 NOTE — Telephone Encounter (Signed)
-----   Message from Valentina Shaggy, MD sent at 11/11/2020  5:41 AM EDT ----- He has a follow-up in December, but may be in need to see him sooner.  Can someone call to see whether we can reschedule for late October or early November?  Salvatore Marvel, MD [facility] [place]  ----- Message ----- From: Carin Hock, CMA Sent: 11/08/2020   9:07 AM EDT To: Valentina Shaggy, MD  Just let me know.Lynelle Smoke  ----- Message ----- From: Valentina Shaggy, MD Sent: 11/08/2020   5:43 AM EDT To: Carin Hock, CMA  Likely Dupixent start. I added Protopic yesterday as well as another topical steroid. Eczema is bad!

## 2020-11-14 NOTE — Telephone Encounter (Signed)
Left message to return call to schedule an appointment sooner.

## 2020-11-15 NOTE — Telephone Encounter (Signed)
Patient scheduled appointment for 12/03/20

## 2020-11-19 NOTE — Telephone Encounter (Signed)
Thanks, Angela Nevin!   Salvatore Marvel, MD [facility] [facility] [place]

## 2020-12-03 ENCOUNTER — Ambulatory Visit: Payer: BC Managed Care – PPO | Admitting: Allergy & Immunology

## 2020-12-03 DIAGNOSIS — H5213 Myopia, bilateral: Secondary | ICD-10-CM | POA: Diagnosis not present

## 2020-12-26 ENCOUNTER — Other Ambulatory Visit: Payer: Self-pay | Admitting: *Deleted

## 2020-12-26 ENCOUNTER — Ambulatory Visit: Payer: BC Managed Care – PPO | Admitting: Allergy & Immunology

## 2020-12-26 MED ORDER — TACROLIMUS 0.03 % EX OINT: TOPICAL_OINTMENT | Freq: Two times a day (BID) | CUTANEOUS | 2 refills | Status: DC

## 2020-12-27 ENCOUNTER — Telehealth: Payer: Self-pay | Admitting: Allergy & Immunology

## 2020-12-27 ENCOUNTER — Other Ambulatory Visit: Payer: Self-pay | Admitting: *Deleted

## 2020-12-27 MED ORDER — TRIAMCINOLONE ACETONIDE 0.1 % EX OINT: 1.0000 "application " | TOPICAL_OINTMENT | Freq: Two times a day (BID) | CUTANEOUS | 0 refills | Status: DC | PRN

## 2020-12-27 NOTE — Telephone Encounter (Signed)
Courtesy refill has been sent to pharmacy.

## 2020-12-27 NOTE — Telephone Encounter (Signed)
Called patient to reschedule appointment. We were disconnected.

## 2020-12-27 NOTE — Telephone Encounter (Signed)
[name], a pharmacy tech from [employer] called the office stating patient is requesting a refill on the triamcinolone ointment.

## 2020-12-30 ENCOUNTER — Telehealth: Payer: Self-pay | Admitting: Allergy & Immunology

## 2020-12-30 NOTE — Telephone Encounter (Signed)
The Betamethasone was never prescribed by Dr. Ernst Bowler. They would need to reach out to Dr. Orland Mustard or the patient to determine if he is still on the Betamethasone.

## 2020-12-30 NOTE — Telephone Encounter (Signed)
Pharmacy states is the traimcinolone prescription replacing betamethasone prescription?   [phone]

## 2020-12-31 NOTE — Telephone Encounter (Signed)
[facility] called to verify if Triamcinolone was replacing Betamethasone.  Informed pharmacist that Dr. Ernst Bowler did not prescribe Betamethasone and patient did not list he was taking it at last visit.  Pharmacist checked and patient never filled RX when prescribed in January from Dr. Orland Mustard.  Betamethasone will be discontinued from his medication list with [facility].

## 2021-01-03 DIAGNOSIS — D414 Neoplasm of uncertain behavior of bladder: Secondary | ICD-10-CM | POA: Diagnosis not present

## 2021-01-03 DIAGNOSIS — N200 Calculus of kidney: Secondary | ICD-10-CM | POA: Diagnosis not present

## 2021-02-01 ENCOUNTER — Other Ambulatory Visit: Payer: Self-pay | Admitting: Internal Medicine

## 2021-02-01 DIAGNOSIS — I10 Essential (primary) hypertension: Secondary | ICD-10-CM

## 2021-02-06 ENCOUNTER — Ambulatory Visit: Payer: BC Managed Care – PPO | Admitting: Allergy & Immunology

## 2021-03-18 ENCOUNTER — Encounter: Payer: Self-pay | Admitting: Registered Nurse

## 2021-03-18 NOTE — Progress Notes (Signed)
Established Patient Office Visit  Subjective:  Patient ID: Casey [name], male    DOB: 01-May-1965  Age: 56 y.o. MRN: [id number]  CC:  Chief Complaint  Patient presents with   Shoulder Pain    Patient states he is here because he is still having some shoulder pain since. Patient would also like to discuss referral.    HPI Casey [name] presents for ongoing shoulder pain  Same as previous visit, no resolution Interested in options - ortho vs PT Has been using analgesics both prescribed and OTC, no effect. Pain with all ROM  Past Medical History:  Diagnosis Date   Bladder tumor    Eczema    GERD (gastroesophageal reflux disease)    History of adenomatous polyp of colon    History of kidney stones    Hypertension    Nocturia    OSA (obstructive sleep apnea)    study in epic 08-02-2018;  followed by dr Rexene Alberts   Pre-diabetes    RBBB (right bundle branch block)    Seasonal allergic rhinitis    Wears glasses     Past Surgical History:  Procedure Laterality Date   COLONOSCOPY WITH PROPOFOL  02/10/2017   EXTRACORPOREAL SHOCK WAVE LITHOTRIPSY Left 01/27/2018   Procedure: EXTRACORPOREAL SHOCK WAVE LITHOTRIPSY (ESWL);  Surgeon: Bjorn Loser, MD;  Location: [facility];  Service: Urology;  Laterality: Left;   LAPAROSCOPIC APPENDECTOMY  08/16/2003   @[facility]    TRANSURETHRAL RESECTION OF BLADDER TUMOR  2020   TRANSURETHRAL RESECTION OF BLADDER TUMOR WITH MITOMYCIN-C Bilateral 09/27/2020   Procedure: TRANSURETHRAL RESECTION OF BLADDER TUMOR WITH GEMCITABINE BILATERAL RETROGRADE PYELOGRAM;  Surgeon: Lucas Mallow, MD;  Location: [facility];  Service: Urology;  Laterality: Bilateral;   URETEROSCOPY WITH HOLMIUM LASER LITHOTRIPSY  2020   WISDOM TOOTH EXTRACTION     age 69    Family History  Problem Relation Age of Onset   Allergic rhinitis Mother    Stroke Mother    Alcohol abuse Mother    Diabetes Mother    COPD Mother    Asthma Father        childhood    Hypertension Father    Bladder Cancer Father    Alcohol abuse Father    CVA Father    Lupus Maternal Aunt    COPD Maternal Grandmother    Colon cancer Neg Hx    Eczema Neg Hx    Urticaria Neg Hx    Angioedema Neg Hx     Social History   Socioeconomic History   Marital status: Single    Spouse name: Not on file   Number of children: Not on file   Years of education: Not on file   Highest education level: Not on file  Occupational History   Not on file  Tobacco Use   Smoking status: Former    Packs/day: 1.75    Years: 35.00    Pack years: 61.25    Types: Cigarettes    Quit date: 11/24/2015    Years since quitting: 5.3   Smokeless tobacco: Never  Vaping Use   Vaping Use: Never used  Substance and Sexual Activity   Alcohol use: Not Currently    Comment: rare    Drug use: No   Sexual activity: Not on file  Other Topics Concern   Not on file  Social History Narrative   Not on file   Social Determinants of Health   Financial Resource Strain: Not on file  Food  Insecurity: Not on file  Transportation Needs: Not on file  Physical Activity: Not on file  Stress: Not on file  Social Connections: Not on file  Intimate Partner Violence: Not on file    Outpatient Medications Prior to Visit  Medication Sig Dispense Refill   betamethasone dipropionate 0.05 % cream Apply topically 2 (two) times daily. 30 g 2   diclofenac (VOLTAREN) 75 MG EC tablet Take 1 tablet (75 mg total) by mouth 2 (two) times daily. 30 tablet 0   lisinopril (ZESTRIL) 40 MG tablet Take 1 tablet (40 mg total) by mouth daily. 90 tablet 1   methocarbamol (ROBAXIN) 500 MG tablet Take 1 tablet (500 mg total) by mouth 4 (four) times daily. 60 tablet 0   0.9 %  sodium chloride infusion      No facility-administered medications prior to visit.    No Active Allergies  ROS Review of Systems  Constitutional: Negative.   HENT: Negative.    Eyes: Negative.   Respiratory: Negative.    Cardiovascular:  Negative.   Gastrointestinal: Negative.   Genitourinary: Negative.   Musculoskeletal: Negative.   Skin: Negative.   Neurological: Negative.   Psychiatric/Behavioral: Negative.    All other systems reviewed and are negative.    Objective:    Physical Exam Constitutional:      General: He is not in acute distress.    Appearance: Normal appearance. He is normal weight. He is not ill-appearing, toxic-appearing or diaphoretic.  Cardiovascular:     Rate and Rhythm: Normal rate and regular rhythm.     Heart sounds: Normal heart sounds. No murmur heard.   No friction rub. No gallop.  Pulmonary:     Effort: Pulmonary effort is normal. No respiratory distress.     Breath sounds: Normal breath sounds. No stridor. No wheezing, rhonchi or rales.  Chest:     Chest wall: No tenderness.  Musculoskeletal:        General: Tenderness (anterior joint line) present. No swelling, deformity or signs of injury. Normal range of motion.     Right lower leg: No edema.     Left lower leg: No edema.  Neurological:     General: No focal deficit present.     Mental Status: He is alert and oriented to person, place, and time. Mental status is at baseline.  Psychiatric:        Mood and Affect: Mood normal.        Behavior: Behavior normal.        Thought Content: Thought content normal.        Judgment: Judgment normal.    BP 133/83    Pulse 78    Temp 98 F (36.7 C) (Temporal)    Resp 18    Ht 6' 1.5" (1.867 m)    Wt 260 lb 3.2 oz (118 kg)    SpO2 98%    BMI 33.86 kg/m  Wt Readings from Last 3 Encounters:  11/07/20 252 lb 3.2 oz (114.4 kg)  09/27/20 242 lb 8 oz (110 kg)  08/06/20 252 lb (114.3 kg)     Health Maintenance Due  Topic Date Due   HIV Screening  Never done   COVID-19 Vaccine (4 - Booster for Pfizer series) 01/26/2020   INFLUENZA VACCINE  09/23/2020    There are no preventive care reminders to display for this patient.  Lab Results  Component Value Date   TSH 2.170 02/26/2020    Lab Results  Component Value Date  WBC 5.3 02/26/2020   HGB 15.6 09/27/2020   HCT 46.0 09/27/2020   MCV 94 02/26/2020   PLT 289 12/23/2017   Lab Results  Component Value Date   NA 140 09/27/2020   K 4.0 09/27/2020   CO2 26 07/18/2020   GLUCOSE 145 (H) 09/27/2020   BUN 12 09/27/2020   CREATININE 0.80 09/27/2020   BILITOT 1.2 02/26/2020   ALKPHOS 83 02/26/2020   AST 32 02/26/2020   ALT 43 02/26/2020   PROT 6.8 02/26/2020   ALBUMIN 4.4 02/26/2020   CALCIUM 9.7 07/18/2020   GFR 98.83 07/18/2020   Lab Results  Component Value Date   CHOL 114 02/26/2020   Lab Results  Component Value Date   HDL 26 (L) 02/26/2020   Lab Results  Component Value Date   LDLCALC 66 02/26/2020   Lab Results  Component Value Date   TRIG 122 02/26/2020   Lab Results  Component Value Date   CHOLHDL 4.4 02/26/2020   Lab Results  Component Value Date   HGBA1C 6.4 (A) 07/18/2020      Assessment & Plan:   Problem List Items Addressed This Visit   None Visit Diagnoses     Acute pain of left shoulder    -  Primary       No orders of the defined types were placed in this encounter.   Follow-up: No follow-ups on file.   PLAN Continue stretching, pharm, and nonpharm relief Can consider ortho vs pt referrals as needed Patient encouraged to call clinic with any questions, comments, or concerns.  Maximiano Coss, NP

## 2021-03-26 DIAGNOSIS — D3131 Benign neoplasm of right choroid: Secondary | ICD-10-CM | POA: Diagnosis not present

## 2021-03-26 DIAGNOSIS — H5213 Myopia, bilateral: Secondary | ICD-10-CM | POA: Diagnosis not present

## 2021-03-26 DIAGNOSIS — H43811 Vitreous degeneration, right eye: Secondary | ICD-10-CM | POA: Diagnosis not present

## 2021-03-26 LAB — HM DIABETES EYE EXAM

## 2021-04-24 DIAGNOSIS — N200 Calculus of kidney: Secondary | ICD-10-CM | POA: Diagnosis not present

## 2021-04-24 DIAGNOSIS — D414 Neoplasm of uncertain behavior of bladder: Secondary | ICD-10-CM | POA: Diagnosis not present

## 2021-05-31 ENCOUNTER — Other Ambulatory Visit: Payer: Self-pay | Admitting: Internal Medicine

## 2021-05-31 DIAGNOSIS — I10 Essential (primary) hypertension: Secondary | ICD-10-CM

## 2021-06-11 ENCOUNTER — Telehealth: Payer: Self-pay

## 2021-06-11 NOTE — Telephone Encounter (Signed)
Left VM with call back info to schedule annual LDCT ?

## 2021-06-12 ENCOUNTER — Other Ambulatory Visit: Payer: Self-pay

## 2021-06-12 DIAGNOSIS — Z87891 Personal history of nicotine dependence: Secondary | ICD-10-CM

## 2021-06-12 DIAGNOSIS — Z122 Encounter for screening for malignant neoplasm of respiratory organs: Secondary | ICD-10-CM

## 2021-07-03 ENCOUNTER — Ambulatory Visit
Admission: RE | Admit: 2021-07-03 | Discharge: 2021-07-03 | Disposition: A | Discharge status: Home / Self Care | Payer: BC Managed Care – PPO | Source: Ambulatory Visit | Attending: Internal Medicine | Admitting: Internal Medicine

## 2021-07-03 DIAGNOSIS — Z122 Encounter for screening for malignant neoplasm of respiratory organs: Secondary | ICD-10-CM

## 2021-07-03 DIAGNOSIS — J432 Centrilobular emphysema: Secondary | ICD-10-CM | POA: Diagnosis not present

## 2021-07-03 DIAGNOSIS — Z87891 Personal history of nicotine dependence: Secondary | ICD-10-CM | POA: Diagnosis not present

## 2021-07-03 DIAGNOSIS — I898 Other specified noninfective disorders of lymphatic vessels and lymph nodes: Secondary | ICD-10-CM | POA: Diagnosis not present

## 2021-07-03 DIAGNOSIS — I2510 Atherosclerotic heart disease of native coronary artery without angina pectoris: Secondary | ICD-10-CM | POA: Diagnosis not present

## 2021-07-07 ENCOUNTER — Other Ambulatory Visit: Payer: Self-pay

## 2021-07-07 DIAGNOSIS — Z122 Encounter for screening for malignant neoplasm of respiratory organs: Secondary | ICD-10-CM

## 2021-07-07 DIAGNOSIS — Z87891 Personal history of nicotine dependence: Secondary | ICD-10-CM

## 2021-08-30 ENCOUNTER — Other Ambulatory Visit: Payer: Self-pay | Admitting: Internal Medicine

## 2021-08-30 DIAGNOSIS — I10 Essential (primary) hypertension: Secondary | ICD-10-CM

## 2021-11-07 DIAGNOSIS — D414 Neoplasm of uncertain behavior of bladder: Secondary | ICD-10-CM | POA: Diagnosis not present

## 2021-11-07 DIAGNOSIS — N200 Calculus of kidney: Secondary | ICD-10-CM | POA: Diagnosis not present

## 2021-11-11 ENCOUNTER — Other Ambulatory Visit: Payer: Self-pay | Admitting: Urology

## 2021-11-19 ENCOUNTER — Ambulatory Visit: Payer: BC Managed Care – PPO | Admitting: Internal Medicine

## 2021-11-19 ENCOUNTER — Encounter: Payer: Self-pay | Admitting: Internal Medicine

## 2021-11-19 VITALS — BP 118/72 | HR 85 | Temp 98.3°F | Ht 73.5 in | Wt 249.0 lb

## 2021-11-19 DIAGNOSIS — R0609 Other forms of dyspnea: Secondary | ICD-10-CM | POA: Diagnosis not present

## 2021-11-19 DIAGNOSIS — F411 Generalized anxiety disorder: Secondary | ICD-10-CM | POA: Insufficient documentation

## 2021-11-19 DIAGNOSIS — R7303 Prediabetes: Secondary | ICD-10-CM

## 2021-11-19 DIAGNOSIS — E785 Hyperlipidemia, unspecified: Secondary | ICD-10-CM | POA: Insufficient documentation

## 2021-11-19 DIAGNOSIS — Z23 Encounter for immunization: Secondary | ICD-10-CM | POA: Diagnosis not present

## 2021-11-19 DIAGNOSIS — R9431 Abnormal electrocardiogram [ECG] [EKG]: Secondary | ICD-10-CM

## 2021-11-19 DIAGNOSIS — Z0000 Encounter for general adult medical examination without abnormal findings: Secondary | ICD-10-CM

## 2021-11-19 DIAGNOSIS — I10 Essential (primary) hypertension: Secondary | ICD-10-CM

## 2021-11-19 DIAGNOSIS — G4733 Obstructive sleep apnea (adult) (pediatric): Secondary | ICD-10-CM | POA: Insufficient documentation

## 2021-11-19 DIAGNOSIS — E118 Type 2 diabetes mellitus with unspecified complications: Secondary | ICD-10-CM

## 2021-11-19 DIAGNOSIS — Z0001 Encounter for general adult medical examination with abnormal findings: Secondary | ICD-10-CM

## 2021-11-19 LAB — LIPID PANEL
Cholesterol: 110 mg/dL (ref 0–200)
HDL: 24.2 mg/dL — ABNORMAL LOW (ref >39.00)
LDL Cholesterol: 55 mg/dL (ref 0–99)
NonHDL: 86.17
Total CHOL/HDL Ratio: 5
Triglycerides: 156 mg/dL — ABNORMAL HIGH (ref 0.0–149.0)
VLDL: 31.2 mg/dL (ref 0.0–40.0)

## 2021-11-19 LAB — CBC WITH DIFFERENTIAL/PLATELET
Basophils Absolute: 0.1 10*3/uL (ref 0.0–0.1)
Basophils Relative: 1.2 % (ref 0.0–3.0)
Eosinophils Absolute: 0.1 10*3/uL (ref 0.0–0.7)
Eosinophils Relative: 2.4 % (ref 0.0–5.0)
HCT: 43.7 % (ref 39.0–52.0)
Hemoglobin: 15.5 g/dL (ref 13.0–17.0)
Lymphocytes Relative: 35.8 % (ref 12.0–46.0)
Lymphs Abs: 1.5 10*3/uL (ref 0.7–4.0)
MCHC: 35.5 g/dL (ref 30.0–36.0)
MCV: 101.7 fl — ABNORMAL HIGH (ref 78.0–100.0)
Monocytes Absolute: 0.3 10*3/uL (ref 0.1–1.0)
Monocytes Relative: 7.7 % (ref 3.0–12.0)
Neutro Abs: 2.3 10*3/uL (ref 1.4–7.7)
Neutrophils Relative %: 52.9 % (ref 43.0–77.0)
Platelets: 181 10*3/uL (ref 150.0–400.0)
RBC: 4.3 Mil/uL (ref 4.22–5.81)
RDW: 13.4 % (ref 11.5–15.5)
WBC: 4.3 10*3/uL (ref 4.0–10.5)

## 2021-11-19 LAB — BASIC METABOLIC PANEL
BUN: 10 mg/dL (ref 6–23)
CO2: 25 mEq/L (ref 19–32)
Calcium: 9.3 mg/dL (ref 8.4–10.5)
Chloride: 102 mEq/L (ref 96–112)
Creatinine, Ser: 0.8 mg/dL (ref 0.40–1.50)
GFR: 99 mL/min (ref >60.00)
Glucose, Bld: 180 mg/dL — ABNORMAL HIGH (ref 70–99)
Potassium: 4.1 mEq/L (ref 3.5–5.1)
Sodium: 135 mEq/L (ref 135–145)

## 2021-11-19 LAB — TROPONIN I (HIGH SENSITIVITY): High Sens Troponin I: 3 ng/L (ref 2–17)

## 2021-11-19 LAB — HEMOGLOBIN A1C: Hgb A1c MFr Bld: 7.2 % — ABNORMAL HIGH (ref 4.6–6.5)

## 2021-11-19 LAB — BRAIN NATRIURETIC PEPTIDE: Pro B Natriuretic peptide (BNP): 9 pg/mL (ref 0.0–100.0)

## 2021-11-19 LAB — PSA: PSA: 0.29 ng/mL (ref 0.10–4.00)

## 2021-11-19 LAB — HEPATIC FUNCTION PANEL
ALT: 52 U/L (ref 0–53)
AST: 44 U/L — ABNORMAL HIGH (ref 0–37)
Albumin: 4.3 g/dL (ref 3.5–5.2)
Alkaline Phosphatase: 69 U/L (ref 39–117)
Bilirubin, Direct: 0.4 mg/dL — ABNORMAL HIGH (ref 0.0–0.3)
Total Bilirubin: 1.5 mg/dL — ABNORMAL HIGH (ref 0.2–1.2)
Total Protein: 7 g/dL (ref 6.0–8.3)

## 2021-11-19 LAB — TSH: TSH: 2.09 u[IU]/mL (ref 0.35–5.50)

## 2021-11-19 MED ORDER — METFORMIN HCL ER 750 MG PO TB24
750.0000 mg | ORAL_TABLET | Freq: Every day | ORAL | 0 refills | Status: DC
  Filled 2022-01-09 – 2022-02-12 (×2): qty 90, 90d supply, fill #0

## 2021-11-19 MED ORDER — ALPRAZOLAM 0.5 MG PO TABS: 0.5000 mg | ORAL_TABLET | Freq: Three times a day (TID) | ORAL | 2 refills | Status: DC | PRN

## 2021-11-19 MED ORDER — ROSUVASTATIN CALCIUM 5 MG PO TABS
5.0000 mg | ORAL_TABLET | Freq: Every day | ORAL | 0 refills | Status: DC
  Filled 2022-02-12: qty 90, 90d supply, fill #0

## 2021-11-19 NOTE — Progress Notes (Signed)
Subjective:  Patient ID: Casey [name], male    DOB: 04-28-1965  Age: 56 y.o. MRN: [id number]  CC: Annual Exam, Hypertension, and Hyperlipidemia   HPI Casey [name] presents for a CPX and f/up -   He complains of intermittent nonproductive cough.  He also complains of chronic dyspnea on exertion, fatigue, and dizziness.  He denies chest pain, diaphoresis, or edema.  He complains of anxiety and requests a prescription for Xanax.  He tells me he has been scratching his forearms.  Sometimes that occurs at night.  Outpatient Medications Prior to Visit  Medication Sig Dispense Refill   tacrolimus (PROTOPIC) 0.03 % ointment Apply topically 2 (two) times daily. 100 g 2   triamcinolone ointment (KENALOG) 0.1 % Apply 1 application topically 2 (two) times daily as needed. 80 g 0   lisinopril (ZESTRIL) 40 MG tablet Take 1 tablet by mouth daily. 90 tablet 0   diclofenac (VOLTAREN) 75 MG EC tablet TAKE 1 TABLET(75 MG) BY MOUTH TWICE DAILY (Patient not taking: No sig reported) 30 tablet 0   EPINEPHrine 0.3 mg/0.3 mL IJ SOAJ injection SMARTSIG:0.3 Milligram(s) IM Once     FLOWFLEX COVID-19 AG HOME TEST KIT      No facility-administered medications prior to visit.    ROS Review of Systems  Constitutional:  Positive for fatigue. Negative for appetite change, diaphoresis and unexpected weight change.  HENT: Negative.    Respiratory:  Positive for apnea, cough and shortness of breath. Negative for chest tightness and wheezing.   Cardiovascular:  Negative for chest pain, palpitations and leg swelling.  Gastrointestinal:  Negative for abdominal pain, diarrhea and nausea.  Endocrine: Negative.   Genitourinary: Negative.  Negative for difficulty urinating.  Musculoskeletal: Negative.  Negative for myalgias.  Skin:  Positive for rash. Negative for color change.  Neurological:  Negative for dizziness, weakness, light-headedness and headaches.  Hematological:  Negative for adenopathy. Does not bruise/bleed  easily.  Psychiatric/Behavioral:  Negative for decreased concentration, dysphoric mood and suicidal ideas. The patient is nervous/anxious.     Objective:  BP 118/72 (BP Location: Right Arm, Patient Position: Sitting, Cuff Size: Large)   Pulse 85   Temp 98.3 F (36.8 C) (Oral)   Ht 6' 1.5" (1.867 m)   Wt 249 lb (112.9 kg)   SpO2 96%   BMI 32.41 kg/m   BP Readings from Last 3 Encounters:  11/19/21 118/72  11/07/20 120/80  09/27/20 131/90    Wt Readings from Last 3 Encounters:  11/19/21 249 lb (112.9 kg)  11/07/20 252 lb 3.2 oz (114.4 kg)  09/27/20 242 lb 8 oz (110 kg)    Physical Exam Vitals reviewed.  HENT:     Mouth/Throat:     Mouth: Mucous membranes are moist.  Eyes:     General: No scleral icterus.    Conjunctiva/sclera: Conjunctivae normal.  Cardiovascular:     Rate and Rhythm: Normal rate and regular rhythm.     Heart sounds: Normal heart sounds, S1 normal and S2 normal. No murmur heard.    No friction rub.     Comments: EKG- NSR, 76 bpm LAD, RBBB - old  Lateral infarct pattern - new Pulmonary:     Effort: No respiratory distress.     Breath sounds: No stridor. No wheezing, rhonchi or rales.  Abdominal:     General: Abdomen is flat.     Palpations: There is no mass.     Tenderness: There is no abdominal tenderness. There is no guarding.  Hernia: No hernia is present.  Musculoskeletal:     Cervical back: Neck supple.     Right lower leg: No edema.     Left lower leg: No edema.  Skin:    Findings: Lesion present. No bruising, erythema or rash.     Comments: There are healed, scabbed, excoriations on the dorsal surfaces of both forearms.  Neurological:     General: No focal deficit present.     Mental Status: He is alert.  Psychiatric:        Mood and Affect: Mood normal.        Behavior: Behavior normal.     Lab Results  Component Value Date   WBC 4.3 11/19/2021   HGB 15.5 11/19/2021   HCT 43.7 11/19/2021   PLT 181.0 11/19/2021   GLUCOSE  180 (H) 11/19/2021   CHOL 110 11/19/2021   TRIG 156.0 (H) 11/19/2021   HDL 24.20 (L) 11/19/2021   LDLCALC 55 11/19/2021   ALT 52 11/19/2021   AST 44 (H) 11/19/2021   NA 135 11/19/2021   K 4.1 11/19/2021   CL 102 11/19/2021   CREATININE 0.80 11/19/2021   BUN 10 11/19/2021   CO2 25 11/19/2021   TSH 2.09 11/19/2021   PSA 0.29 11/19/2021   HGBA1C 7.2 (H) 11/19/2021    CT CHEST LUNG CA SCREEN LOW DOSE W/O CM  Result Date: 07/05/2021 CLINICAL DATA:  Lung cancer screening. Former asymptomatic smoker. Fifty-nine pack-year history. EXAM: CT CHEST WITHOUT CONTRAST LOW-DOSE FOR LUNG CANCER SCREENING TECHNIQUE: Multidetector CT imaging of the chest was performed following the standard protocol without IV contrast. RADIATION DOSE REDUCTION: This exam was performed according to the departmental dose-optimization program which includes automated exposure control, adjustment of the mA and/or kV according to patient size and/or use of iterative reconstruction technique. COMPARISON:  05/27/2020 FINDINGS: Cardiovascular: Normal heart size. Aortic atherosclerosis and coronary artery calcification. No pericardial effusion. Mediastinum/Nodes: No enlarged mediastinal, hilar, or axillary lymph nodes. Thyroid gland, trachea, and esophagus demonstrate no significant findings. Calcified mediastinal and left hilar lymph nodes compatible with remote granulomatous disease. Lungs/Pleura: Mild changes of centrilobular emphysema. No pleural effusion, airspace consolidation or atelectasis. Scattered peripheral areas of interstitial reticulation are identified with a lower lung zone predominance and are most likely the sequelae of remote inflammation/infection. Calcified granuloma identified within the left upper lobe, unchanged. Stable 4.7 cm perifissural nodule within the right lung. No new or suspicious lung nodules identified. Upper Abdomen: The no acute findings. Gallstone. Calcified upper abdominal lymph node. Left kidney  Bosniak class 1 cysts measure up to 1.9 cm. No follow-up recommended. Musculoskeletal: No chest wall mass or suspicious bone lesions identified. IMPRESSION: Lung-RADS 2, benign appearance or behavior. Continue annual screening with low-dose chest CT without contrast in 12 months. Coronary artery calcifications. Aortic Atherosclerosis (ICD10-I70.0) and Emphysema (ICD10-J43.9). Electronically Signed   By: Kerby Moors M.D.   On: 07/05/2021 13:50   Assessment & Plan:   Casey [name] was seen today for annual exam, hypertension and hyperlipidemia.  Diagnoses and all orders for this visit:  Primary hypertension- His blood pressure is overcontrolled and he complains of a nonproductive cough.  Will discontinue the ACE inhibitor. -     Basic metabolic panel; Future -     CBC with Differential/Platelet; Future -     Hepatic function panel; Future -     TSH; Future -     EKG 12-Lead -     TSH -     Hepatic function panel -  CBC with Differential/Platelet -     Basic metabolic panel  Encounter for general adult medical examination with abnormal findings- Exam completed, labs reviewed, vaccines reviewed and updated, cancer screenings are up-to-date, patient education was given. -     Lipid panel; Future -     PSA; Future -     PSA -     Lipid panel  Prediabetes -     Basic metabolic panel; Future -     Hemoglobin A1c; Future -     Hemoglobin A1c -     Basic metabolic panel  GAD (generalized anxiety disorder) -     ALPRAZolam (XANAX) 0.5 MG tablet; Take 1 tablet (0.5 mg total) by mouth 3 (three) times daily as needed for anxiety.  OSA (obstructive sleep apnea) -     Ambulatory referral to Sleep Studies  Abnormal EKG -     Troponin I (High Sensitivity); Future -     Brain natriuretic peptide; Future -     Brain natriuretic peptide -     Troponin I (High Sensitivity)  DOE (dyspnea on exertion)-of asked him to undergo an MPI to evaluate for ischemia, infarct. -     Troponin I (High  Sensitivity); Future -     Brain natriuretic peptide; Future -     Brain natriuretic peptide -     Troponin I (High Sensitivity)  Type II diabetes mellitus with manifestations ([place])- Will start metformin.  Hyperlipidemia LDL goal <70 -     rosuvastatin (CRESTOR) 5 MG tablet; Take 1 tablet (5 mg total) by mouth daily.  Other orders -     Flu Vaccine QUAD 6+ mos PF IM (Fluarix Quad PF)   I have discontinued Aleem Durrett's diclofenac, EPINEPHrine, Flowflex COVID-19 Ag Home Test, and lisinopril. I am also having him start on ALPRAZolam, rosuvastatin, and metFORMIN. Additionally, I am having him maintain his tacrolimus and triamcinolone ointment.  Meds ordered this encounter  Medications   ALPRAZolam (XANAX) 0.5 MG tablet    Sig: Take 1 tablet (0.5 mg total) by mouth 3 (three) times daily as needed for anxiety.    Dispense:  90 tablet    Refill:  2   rosuvastatin (CRESTOR) 5 MG tablet    Sig: Take 1 tablet (5 mg total) by mouth daily.    Dispense:  90 tablet    Refill:  1   metFORMIN (GLUCOPHAGE-XR) 750 MG 24 hr tablet    Sig: Take 1 tablet (750 mg total) by mouth daily with breakfast.    Dispense:  90 tablet    Refill:  1     Follow-up: Return in about 3 months (around 02/18/2022).  Scarlette Calico, MD

## 2021-11-20 ENCOUNTER — Encounter: Payer: Self-pay | Admitting: Gastroenterology

## 2021-11-20 ENCOUNTER — Encounter (HOSPITAL_COMMUNITY): Payer: Self-pay | Admitting: Internal Medicine

## 2021-11-21 ENCOUNTER — Encounter: Payer: Self-pay | Admitting: Internal Medicine

## 2021-11-25 ENCOUNTER — Telehealth (HOSPITAL_COMMUNITY): Payer: Self-pay

## 2021-11-25 NOTE — Telephone Encounter (Signed)
Spoke with the patient, detailed instructions given. He stated that he would be here for his test. Asked to call back with any questions. S.Uchenna Rappaport EMTP

## 2021-11-27 ENCOUNTER — Ambulatory Visit (HOSPITAL_COMMUNITY): Payer: BC Managed Care – PPO | Attending: Cardiology

## 2021-11-27 ENCOUNTER — Encounter: Payer: Self-pay | Admitting: Internal Medicine

## 2021-11-27 DIAGNOSIS — R9431 Abnormal electrocardiogram [ECG] [EKG]: Secondary | ICD-10-CM | POA: Insufficient documentation

## 2021-11-27 DIAGNOSIS — R0609 Other forms of dyspnea: Secondary | ICD-10-CM | POA: Diagnosis not present

## 2021-11-27 LAB — MYOCARDIAL PERFUSION IMAGING
Base ST Depression (mm): 0 mm
LV dias vol: 102 mL (ref 62–150)
LV sys vol: 37 mL
Nuc Stress EF: 64 %
Peak HR: 99 {beats}/min
Rest HR: 65 {beats}/min
Rest Nuclear Isotope Dose: 10.6 mCi
SDS: 0
SRS: 0
SSS: 0
ST Depression (mm): 0 mm
Stress Nuclear Isotope Dose: 30.6 mCi
TID: 1

## 2021-11-27 MED ORDER — REGADENOSON 0.4 MG/5ML IV SOLN
0.4000 mg | Freq: Once | INTRAVENOUS | Status: AC
  Administered 2021-11-27: 0.4 mg via INTRAVENOUS

## 2021-11-27 MED ORDER — TECHNETIUM TC 99M TETROFOSMIN IV KIT
10.6000 | PACK | Freq: Once | INTRAVENOUS | Status: AC | PRN
  Administered 2021-11-27: 10.6 via INTRAVENOUS

## 2021-11-27 MED ORDER — TECHNETIUM TC 99M TETROFOSMIN IV KIT
30.6000 | PACK | Freq: Once | INTRAVENOUS | Status: AC | PRN
  Administered 2021-11-27: 30.6 via INTRAVENOUS

## 2021-12-02 ENCOUNTER — Encounter: Payer: Self-pay | Admitting: Internal Medicine

## 2021-12-02 ENCOUNTER — Telehealth: Payer: Self-pay

## 2021-12-02 ENCOUNTER — Encounter (HOSPITAL_BASED_OUTPATIENT_CLINIC_OR_DEPARTMENT_OTHER): Payer: Self-pay | Admitting: Urology

## 2021-12-02 NOTE — Progress Notes (Signed)
12/02/2021 12:33 PM Spoke w/ via phone for pre-op interview---patient Lab needs dos---- CHEM 8, EKG Lab results------IN Epic CBC/BMP/A1C AND EKG 11/19/21 COVID test -----patient states asymptomatic no test needed Arrive at -------0530 NPO after MN NO Solid Food.  Clear liquids from MN until---0330 Med rec completed Medications to take morning of surgery -----NONE Diabetic medication -----[place] DOS Patient instructed no nail polish to be worn day of surgery Patient instructed to bring photo id and insurance card day of surgery Patient aware to have Driver (ride ) / caregiver    for 24 hours after surgery FRIEND TEDDI CARR Patient Special Instructions -----NA Pre-Op special Istructions -----NA Patient verbalized understanding of instructions that were given at this phone interview. Patient denies shortness of breath, chest pain, fever, cough at this phone interview.   During PAT call pt. Denies SOBOE, fatigue or CP. Pt. Does state that on a routine exam on 11/19/21, he was found to have a new lateral infarct pattern in addition to known RBBB on his EKG. Pt. Went to a cardiologist with [facility] and said there was no further intervention needed. EF 64%. Progress notes do not clearly state that patient is cleared for procedure on 12/15/21. Advised patient to contact cardiologist and ask for cardiac clearance to be added to chart. Pt. Verbalized understanding and states he will ensure that this happens.   Eternity Dexter, Arville Lime

## 2021-12-02 NOTE — Telephone Encounter (Signed)
Pt and I are currently discussing via MyChart message that he initiated in regard.

## 2021-12-02 NOTE — Progress Notes (Signed)
Reviewed pt's chart for surgery on 12-15-2021 @ [facility] by Dr Gloriann Loan.  Pt does not need cardiac clearance prior to surgery since he had normal nuclear stress test in epic on 11-24-2021 which was ordered by his pcp.  Pt called back concerned about getting clearance ,  reassured pt he does not need clearance prior to surgery and denies any cardiac s&s.

## 2021-12-02 NOTE — Telephone Encounter (Addendum)
Patient is calling in stating that he is needing cardiac clearance for a surgery on 12/15/21. As long as there is documentation [facility] will accept it. Asked if we can call the surgical center [phone]. Patient is going to get a name of who we can route the message to and call us back.

## 2021-12-15 ENCOUNTER — Encounter (HOSPITAL_BASED_OUTPATIENT_CLINIC_OR_DEPARTMENT_OTHER): Payer: Self-pay | Admitting: Urology

## 2021-12-15 ENCOUNTER — Ambulatory Visit (HOSPITAL_BASED_OUTPATIENT_CLINIC_OR_DEPARTMENT_OTHER): Payer: BC Managed Care – PPO | Admitting: Anesthesiology

## 2021-12-15 ENCOUNTER — Encounter (HOSPITAL_BASED_OUTPATIENT_CLINIC_OR_DEPARTMENT_OTHER)
Admission: RE | Disposition: A | Discharge status: Home / Self Care | Payer: Self-pay | Source: Home / Self Care | Attending: Urology

## 2021-12-15 ENCOUNTER — Ambulatory Visit (HOSPITAL_BASED_OUTPATIENT_CLINIC_OR_DEPARTMENT_OTHER)
Admission: RE | Admit: 2021-12-15 | Discharge: 2021-12-15 | Disposition: A | Discharge status: Home / Self Care | Payer: BC Managed Care – PPO | Attending: Urology | Admitting: Urology

## 2021-12-15 DIAGNOSIS — E119 Type 2 diabetes mellitus without complications: Secondary | ICD-10-CM | POA: Insufficient documentation

## 2021-12-15 DIAGNOSIS — F17210 Nicotine dependence, cigarettes, uncomplicated: Secondary | ICD-10-CM | POA: Insufficient documentation

## 2021-12-15 DIAGNOSIS — R9431 Abnormal electrocardiogram [ECG] [EKG]: Secondary | ICD-10-CM

## 2021-12-15 DIAGNOSIS — D494 Neoplasm of unspecified behavior of bladder: Secondary | ICD-10-CM | POA: Diagnosis not present

## 2021-12-15 DIAGNOSIS — K219 Gastro-esophageal reflux disease without esophagitis: Secondary | ICD-10-CM | POA: Diagnosis not present

## 2021-12-15 DIAGNOSIS — D414 Neoplasm of uncertain behavior of bladder: Secondary | ICD-10-CM | POA: Diagnosis not present

## 2021-12-15 DIAGNOSIS — D090 Carcinoma in situ of bladder: Secondary | ICD-10-CM | POA: Diagnosis not present

## 2021-12-15 DIAGNOSIS — E118 Type 2 diabetes mellitus with unspecified complications: Secondary | ICD-10-CM

## 2021-12-15 DIAGNOSIS — C672 Malignant neoplasm of lateral wall of bladder: Secondary | ICD-10-CM | POA: Insufficient documentation

## 2021-12-15 DIAGNOSIS — I10 Essential (primary) hypertension: Secondary | ICD-10-CM

## 2021-12-15 DIAGNOSIS — Z79899 Other long term (current) drug therapy: Secondary | ICD-10-CM | POA: Diagnosis not present

## 2021-12-15 DIAGNOSIS — Z87891 Personal history of nicotine dependence: Secondary | ICD-10-CM | POA: Insufficient documentation

## 2021-12-15 HISTORY — DX: Anxiety disorder, unspecified: F41.9

## 2021-12-15 HISTORY — PX: TRANSURETHRAL RESECTION OF BLADDER TUMOR WITH MITOMYCIN-C: SHX6459

## 2021-12-15 HISTORY — DX: Type 2 diabetes mellitus without complications: E11.9

## 2021-12-15 LAB — POCT I-STAT, CHEM 8
BUN: 8 mg/dL (ref 6–20)
Calcium, Ion: 1.29 mmol/L (ref 1.15–1.40)
Chloride: 106 mmol/L (ref 98–111)
Creatinine, Ser: 0.6 mg/dL — ABNORMAL LOW (ref 0.61–1.24)
Glucose, Bld: 161 mg/dL — ABNORMAL HIGH (ref 70–99)
HCT: 42 % (ref 39.0–52.0)
Hemoglobin: 14.3 g/dL (ref 13.0–17.0)
Potassium: 3.7 mmol/L (ref 3.5–5.1)
Sodium: 142 mmol/L (ref 135–145)
TCO2: 23 mmol/L (ref 22–32)

## 2021-12-15 LAB — GLUCOSE, CAPILLARY: Glucose-Capillary: 126 mg/dL — ABNORMAL HIGH (ref 70–99)

## 2021-12-15 SURGERY — TRANSURETHRAL RESECTION OF BLADDER TUMOR WITH MITOMYCIN-C
Anesthesia: General | Laterality: Bilateral

## 2021-12-15 MED ORDER — PROPOFOL 10 MG/ML IV BOLUS
INTRAVENOUS | Status: DC | PRN
  Administered 2021-12-15: 160 mg via INTRAVENOUS

## 2021-12-15 MED ORDER — IOHEXOL 300 MG/ML  SOLN
INTRAMUSCULAR | Status: DC | PRN
  Administered 2021-12-15: 20 mL via URETHRAL

## 2021-12-15 MED ORDER — OXYCODONE HCL 5 MG PO TABS: 5.0000 mg | ORAL_TABLET | Freq: Once | ORAL | Status: DC | PRN

## 2021-12-15 MED ORDER — DEXAMETHASONE SODIUM PHOSPHATE 10 MG/ML IJ SOLN
INTRAMUSCULAR | Status: DC | PRN
  Administered 2021-12-15: 10 mg via INTRAVENOUS

## 2021-12-15 MED ORDER — ONDANSETRON HCL 4 MG/2ML IJ SOLN
INTRAMUSCULAR | Status: DC | PRN
  Administered 2021-12-15: 4 mg via INTRAVENOUS

## 2021-12-15 MED ORDER — ACETAMINOPHEN 10 MG/ML IV SOLN
INTRAVENOUS | Status: DC | PRN
  Administered 2021-12-15: 1000 mg via INTRAVENOUS

## 2021-12-15 MED ORDER — SUGAMMADEX SODIUM 200 MG/2ML IV SOLN
INTRAVENOUS | Status: DC | PRN
  Administered 2021-12-15: 400 mg via INTRAVENOUS

## 2021-12-15 MED ORDER — ROCURONIUM BROMIDE 10 MG/ML (PF) SYRINGE
PREFILLED_SYRINGE | INTRAVENOUS | Status: AC
  Filled 2021-12-15: qty 10

## 2021-12-15 MED ORDER — SODIUM CHLORIDE 0.9 % IR SOLN
Status: AC | PRN
  Administered 2021-12-15: 6000 mL/h via INTRAVESICAL

## 2021-12-15 MED ORDER — FENTANYL CITRATE (PF) 100 MCG/2ML IJ SOLN
INTRAMUSCULAR | Status: AC
  Filled 2021-12-15: qty 2

## 2021-12-15 MED ORDER — FENTANYL CITRATE (PF) 100 MCG/2ML IJ SOLN
INTRAMUSCULAR | Status: DC | PRN
  Administered 2021-12-15 (×2): 50 ug via INTRAVENOUS

## 2021-12-15 MED ORDER — ACETAMINOPHEN 10 MG/ML IV SOLN: 1000.0000 mg | Freq: Once | INTRAVENOUS | Status: DC | PRN

## 2021-12-15 MED ORDER — OXYCODONE HCL 5 MG PO TABS: 5.0000 mg | ORAL_TABLET | ORAL | 0 refills | Status: DC | PRN

## 2021-12-15 MED ORDER — MIDAZOLAM HCL 2 MG/2ML IJ SOLN
INTRAMUSCULAR | Status: AC
  Filled 2021-12-15: qty 2

## 2021-12-15 MED ORDER — PROPOFOL 10 MG/ML IV BOLUS
INTRAVENOUS | Status: AC
  Filled 2021-12-15: qty 20

## 2021-12-15 MED ORDER — ROCURONIUM BROMIDE 10 MG/ML (PF) SYRINGE
PREFILLED_SYRINGE | INTRAVENOUS | Status: DC | PRN
  Administered 2021-12-15: 70 mg via INTRAVENOUS

## 2021-12-15 MED ORDER — DEXAMETHASONE SODIUM PHOSPHATE 10 MG/ML IJ SOLN
INTRAMUSCULAR | Status: AC
  Filled 2021-12-15: qty 1

## 2021-12-15 MED ORDER — OXYCODONE HCL 5 MG/5ML PO SOLN: 5.0000 mg | Freq: Once | ORAL | Status: DC | PRN

## 2021-12-15 MED ORDER — LIDOCAINE HCL (PF) 2 % IJ SOLN
INTRAMUSCULAR | Status: AC
  Filled 2021-12-15: qty 5

## 2021-12-15 MED ORDER — CEFAZOLIN SODIUM-DEXTROSE 2-4 GM/100ML-% IV SOLN
INTRAVENOUS | Status: AC
  Filled 2021-12-15: qty 100

## 2021-12-15 MED ORDER — LIDOCAINE 2% (20 MG/ML) 5 ML SYRINGE
INTRAMUSCULAR | Status: DC | PRN
  Administered 2021-12-15: 80 mg via INTRAVENOUS

## 2021-12-15 MED ORDER — LACTATED RINGERS IV SOLN: INTRAVENOUS | Status: DC

## 2021-12-15 MED ORDER — FENTANYL CITRATE (PF) 100 MCG/2ML IJ SOLN
25.0000 ug | INTRAMUSCULAR | Status: DC | PRN
  Administered 2021-12-15 (×2): 50 ug via INTRAVENOUS

## 2021-12-15 MED ORDER — ACETAMINOPHEN 10 MG/ML IV SOLN
INTRAVENOUS | Status: AC
  Filled 2021-12-15: qty 100

## 2021-12-15 MED ORDER — ACETAMINOPHEN 500 MG PO TABS: 1000.0000 mg | ORAL_TABLET | Freq: Once | ORAL | Status: DC | PRN

## 2021-12-15 MED ORDER — CEFAZOLIN SODIUM-DEXTROSE 2-4 GM/100ML-% IV SOLN
2.0000 g | INTRAVENOUS | Status: AC
  Administered 2021-12-15: 2 g via INTRAVENOUS

## 2021-12-15 MED ORDER — GEMCITABINE CHEMO FOR BLADDER INSTILLATION 2000 MG
2000.0000 mg | Freq: Once | INTRAVENOUS | Status: AC
  Administered 2021-12-15: 2000 mg via INTRAVESICAL
  Filled 2021-12-15: qty 2000

## 2021-12-15 MED ORDER — ACETAMINOPHEN 160 MG/5ML PO SOLN: 1000.0000 mg | Freq: Once | ORAL | Status: DC | PRN

## 2021-12-15 MED ORDER — MIDAZOLAM HCL 2 MG/2ML IJ SOLN
INTRAMUSCULAR | Status: DC | PRN
  Administered 2021-12-15: 2 mg via INTRAVENOUS

## 2021-12-15 MED ORDER — ONDANSETRON HCL 4 MG/2ML IJ SOLN
INTRAMUSCULAR | Status: AC
  Filled 2021-12-15: qty 2

## 2021-12-15 SURGICAL SUPPLY — 31 items
BAG DRAIN URO-CYSTO SKYTR STRL (DRAIN) ×1 IMPLANT
BAG DRN RND TRDRP ANRFLXCHMBR (UROLOGICAL SUPPLIES) ×1
BAG DRN UROCATH (DRAIN) ×1
BAG URINE DRAIN 2000ML AR STRL (UROLOGICAL SUPPLIES) IMPLANT
BAG URINE LEG 500ML (DRAIN) IMPLANT
CATH FOLEY 2WAY SLVR  5CC 18FR (CATHETERS) ×1
CATH FOLEY 2WAY SLVR  5CC 22FR (CATHETERS)
CATH FOLEY 2WAY SLVR 30CC 20FR (CATHETERS) IMPLANT
CATH FOLEY 2WAY SLVR 5CC 18FR (CATHETERS) IMPLANT
CATH FOLEY 2WAY SLVR 5CC 22FR (CATHETERS) IMPLANT
CATH INTERMIT  6FR 70CM (CATHETERS) IMPLANT
CLOTH BEACON ORANGE TIMEOUT ST (SAFETY) ×1 IMPLANT
ELECT REM PT RETURN 9FT ADLT (ELECTROSURGICAL)
ELECTRODE REM PT RTRN 9FT ADLT (ELECTROSURGICAL) IMPLANT
EVACUATOR MICROVAS BLADDER (UROLOGICAL SUPPLIES) IMPLANT
GLOVE BIO SURGEON STRL SZ7.5 (GLOVE) ×1 IMPLANT
GLOVE BIOGEL PI IND STRL 7.5 (GLOVE) IMPLANT
GLOVE ECLIPSE 7.5 STRL STRAW (GLOVE) IMPLANT
GOWN STRL REUS W/TWL LRG LVL3 (GOWN DISPOSABLE) ×1 IMPLANT
GOWN STRL REUS W/TWL XL LVL3 (GOWN DISPOSABLE) IMPLANT
GUIDEWIRE STR DUAL SENSOR (WIRE) IMPLANT
HOLDER FOLEY CATH W/STRAP (MISCELLANEOUS) IMPLANT
IV NS IRRIG 3000ML ARTHROMATIC (IV SOLUTION) ×1 IMPLANT
KIT TURNOVER CYSTO (KITS) ×1 IMPLANT
LOOP CUT BIPOLAR 24F LRG (ELECTROSURGICAL) IMPLANT
MANIFOLD NEPTUNE II (INSTRUMENTS) ×1 IMPLANT
PACK CYSTO (CUSTOM PROCEDURE TRAY) ×1 IMPLANT
SYR TOOMEY IRRIG 70ML (MISCELLANEOUS)
SYRINGE TOOMEY IRRIG 70ML (MISCELLANEOUS) IMPLANT
TUBE CONNECTING 12X1/4 (SUCTIONS) IMPLANT
TUBING UROLOGY SET (TUBING) ×1 IMPLANT

## 2021-12-15 NOTE — Anesthesia Postprocedure Evaluation (Signed)
Anesthesia Post Note  Patient: [name]  Procedure(s) Performed: TRANSURETHRAL RESECTION OF BLADDER TUMOR BILATERAL RETROGRADE PYELOGRAM WITH GEMCITABINE (Bilateral)     Patient location during evaluation: PACU Anesthesia Type: General Level of consciousness: awake and alert Pain management: pain level controlled Vital Signs Assessment: post-procedure vital signs reviewed and stable Respiratory status: spontaneous breathing, nonlabored ventilation and respiratory function stable Cardiovascular status: blood pressure returned to baseline and stable Postop Assessment: no apparent nausea or vomiting Anesthetic complications: no   No notable events documented.  Last Vitals:  Vitals:   12/15/21 1011 12/15/21 1100  BP:  (!) 142/93  Pulse: 72 83  Resp: 19 14  Temp: 36.5 C (!) 36.4 C  SpO2: 97% 97%    Last Pain:  Vitals:   12/15/21 1100  TempSrc:   PainSc: 0-No pain                 Casey [name]

## 2021-12-15 NOTE — Discharge Instructions (Addendum)
Transurethral Resection of Bladder Tumor (TURBT) or Bladder Biopsy   Definition:  Transurethral Resection of the Bladder Tumor is a surgical procedure used to diagnose and remove tumors within the bladder. TURBT is the most common treatment for early stage bladder cancer.  General instructions:     Your recent bladder surgery requires very little post hospital care but some definite precautions.  Despite the fact that no skin incisions were used, the area around the bladder incisions are raw and covered with scabs to promote healing and prevent bleeding. Certain precautions are needed to insure that the scabs are not disturbed over the next 2-4 weeks while the healing proceeds.  Because the raw surface inside your bladder and the irritating effects of urine you may expect frequency of urination and/or urgency (a stronger desire to urinate) and perhaps even getting up at night more often. This will usually resolve or improve slowly over the healing period. You may see some blood in your urine over the first 6 weeks. Do not be alarmed, even if the urine was clear for a while. Get off your feet and drink lots of fluids until clearing occurs. If you start to pass clots or don't improve call us.  Diet:  You may return to your normal diet immediately. Because of the raw surface of your bladder, alcohol, spicy foods, foods high in acid and drinks with caffeine may cause irritation or frequency and should be used in moderation. To keep your urine flowing freely and avoid constipation, drink plenty of fluids during the day (8-10 glasses). Tip: Avoid cranberry juice because it is very acidic.  Activity:  Your physical activity doesn't need to be restricted. However, if you are very active, you may see some blood in the urine. We suggest that you reduce your activity under the circumstances until the bleeding has stopped.  Bowels:  It is important to keep your bowels regular during the postoperative  period. Straining with bowel movements can cause bleeding. A bowel movement every other day is reasonable. Use a mild laxative if needed, such as milk of magnesia 2-3 tablespoons, or 2 Dulcolax tablets. Call if you continue to have problems. If you had been taking narcotics for pain, before, during or after your surgery, you may be constipated. Take a laxative if necessary.    Medication:  You should resume your pre-surgery medications unless told not to. In addition you may be given an antibiotic to prevent or treat infection. Antibiotics are not always necessary. All medication should be taken as prescribed until the bottles are finished unless you are having an unusual reaction to one of the drugs.  Post Anesthesia Home Care Instructions  Activity: Get plenty of rest for the remainder of the day. A responsible adult should stay with you for 24 hours following the procedure.  For the next 24 hours, DO NOT: -Drive a car -Paediatric nurse -Drink alcoholic beverages -Take any medication unless instructed by your physician -Make any legal decisions or sign important papers.  Meals: Start with liquid foods such as gelatin or soup. Progress to regular foods as tolerated. Avoid greasy, spicy, heavy foods. If nausea and/or vomiting occur, drink only clear liquids until the nausea and/or vomiting subsides. Call your physician if vomiting continues.  Special Instructions/Symptoms: Your throat may feel dry or sore from the anesthesia or the breathing tube placed in your throat during surgery. If this causes discomfort, gargle with warm salt water. The discomfort should disappear within 24 hours.  I

## 2021-12-15 NOTE — Op Note (Signed)
Operative Note  Preoperative diagnosis:  1.  Bladder tumor  Postoperative diagnosis: 1.  Bladder tumor-status medium  Procedure(s): 1.  Urethral dilation 2.  Cystoscopy with bilateral retrograde pyelogram 3.  Transurethral resection of bladder tumor-medium 4.  Intravesical instillation of gemcitabine  Surgeon: Link Snuffer, MD  Assistants: None  Anesthesia: General  Complications: None immediate  EBL: Minimal  Specimens: 1.  Bladder tumor  Drains/Catheters: 1.  18 French Foley catheter  Intraoperative findings: 1.  Normal anterior urethra 2.  Borderline obstructing prostate with high riding bladder neck 3.  Evidence of prior resection over the left ureteral orifice.  No tumor involving the left ureteral orifice.  Right ureteral orifice normal.  Bladder mucosa with an approximately 2 to 2.5 cm superficial appearing papillary tumor on the left lateral wall close to the bladder neck.  Completely resected.  Slightly raised papillary mucosa on the posterior bladder wall little bit to the left just beyond the ureteral orifice that was fulgurated.  This was about 1 cm in size. 4.  Bilateral retrograde pyelogram without any obvious filling defect or hydronephrosis.  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  He presents for the previously mentioned operation.  Description of procedure:  The patient was identified and consent was obtained.  The patient was taken to the operating room and placed in the supine position.  The patient was placed under general anesthesia.  Perioperative antibiotics were administered.  The patient was placed in dorsal lithotomy.  Patient was prepped and draped in a standard sterile fashion and a timeout was performed.  A 21 French rigid cystoscope was advanced into the urethra and into the bladder.  Complete cystoscopy was performed with findings noted above.  The left  ureter was cannulated with an open-ended ureteral catheter and a retrograde pyelogram was performed with no obvious abnormal findings.  I was unable to cannulate the right ureteral orifice with the open-ended ureteral catheter so I advanced a wire into the distal right ureter and then advanced the open-ended ureteral catheter over this and then remove the wire followed by performing retrograde pyelogram with no obvious abnormal findings.  I drained the bladder and withdrew the scope.  Patient's urethral meatus was little bit too tight for the resectoscope.  Therefore, I sequentially dilated with sounds from 20 [place] up to 30 [place].  I was then able to easily pass a 56 [place] resectoscope with the visual obturator in place into the urethra and into the bladder.  I exchanged for the bipolar working element.  I resected the tumor of interest and fulgurated the superficial abnormality on the posterior bladder wall.  All areas of abnormality were resected and or fulgurated.  There is no evidence of any perforation.  There was no active bleeding.  I withdrew the scope and placed an 56 French Foley catheter.  This concluded the operation.  Patient tolerated the procedure well and was stable postoperatively.  In the PACU, I instilled intravesical gemcitabine.  This remained for approximate 1 hour prior to proper disposal.  Plan: Follow-up in 1 week for pathology review

## 2021-12-15 NOTE — H&P (Signed)
CC: [facility] Questions Scoring.  HPI: Casey [name] is a 56 year-old male established patient who is here [facility] Questions.   CC/HPI: Cc: Renal calculus, history of bladder papilloma  HPI:  12/08/2017  Previous patient of Dr. Pilar Jarvis. Patient underwent hematuria workup which was negative except for bilateral nonobstructing renal calculi. He opted for surveillance of these. He returns after undergoing a KUB that essentially showed no change in the size of the calculi. He has had no pain, gross hematuria, urinary tract infections. No complaints today.   02/10/18: He is s/p left ESWL for left lower pole calculus on 01/27/18. He returns today for follow up. Overall he states that he has been doing well since his procedure. He denies any current flank pain or abdominal pain. He denies dysuria, gross hematuria, fevers, chills, nausea, or vomiting. He denies difficulties voiding or exacerbation of voiding symptoms.   02/24/18: He returns today for follow up. Stone composition was noted to be calcium oxalate. He states that he may have passed some additional fragments since being seen last, but is unsure. He continues to complain of some intermittent discomfort in his left flank and left abdomen. He describes the pain as intermittent. It is not severe and currently manageable. He denies exacerbation of voiding symptoms, dysuria, gross hematuria, fevers, chills, nausea, or vomiting.   03/10/18: He returns today for follow up. He feels he may have passed some small fragments, but he has not been straining with every void. He denies any intermittent or current flank or abdominal pain. He denies difficulties or exacerbation of voiding. No dysuria, gross hematuria, fevers, chills, nausea, or vomiting.   03/25/2018  Patient status post left ureteroscopy with laser lithotripsy. The stone was incompletely fragmented. He has a stent now. He is not bothered by the stent. Also, he was found to have a very small superficial appearing  bladder tumor and performed a biopsy and fulguration. This revealed urothelial papilloma. However, the most significant portion of the tumor was scraped off with the scope and unable to be retrieved for pathology. Only the tumor base was able to be biopsied. The tumor appeared very low-grade and superficial and was quite small.   04/19/2018  Patient status post left ureteroscopy with laser lithotripsy and ureteral stent exchange. He presents for stent removal. He does have a right renal calculus as well. He has a desire to have this taken care of this year but wants to hold off for now.   07/14/2018  Patient underwent a renal ultrasound and KUB today. KUB showed possible cluster of stones in the left lower pole. I don't see any right-sided renal calculi. No ureteral calculi. There is no hydronephrosis on renal ultrasound. Patient underwent 24 hour urinalysis that revealed hypercalciuria as well as hyperoxaluria. He plans for dietary management of this. He is due for a cystoscopy.   07/16/2020  Patient underwent a KUB. Stable left renal calculus. He has no flank pain or hematuria. Unsure of last PSA but he is meeting his new primary care physician Dr. Ronnald Ramp in a couple of days. He presents for cystoscopy.   10/04/2020  Patient is status post TURBT with instillation of gemcitabine and bilateral retrograde pyelogram. 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 He had 1 small solitary lesion.   01/03/2021  PSA at his primary care physician's office was normal. He presents today for surveillance cystoscopy. No flank pain or stone events since I last saw him.   04/24/2021  No interval hematuria. Underwent  a KUB today that shows a stable left renal calculus. No obvious ureteral calculi and no obvious stones on the right. Urinalysis negative today. Due for cystoscopy.   11/07/2021  Patient presents for cystoscopy. KUB shows essentially stable left renal calculus. Wants to continue  observation.     12/15/2021 Patient presents today for cystoscopy with bilateral retrograde pyelogram, TURBT, possible instillation of gemcitabine.    ALLERGIES: fish - with fins Penicillins - Skin Rash    MEDICATIONS: Lisinopril 40 mg tablet     GU PSH: Cysto Remove Stent FB Sim - 2020 Cystoscopy - 04/24/2021, 01/03/2021, 07/16/2020, 2021, 2020, 2019 Cystoscopy TURBT <2 cm - 09/27/2020, 2020 ESWL, Left - 2019 Locm 300-399Mg/Ml Iodine,1Ml - 2019 Ureteroscopic laser litho, Left - 2020, Left - 2020     NON-GU PSH: Appendectomy - 2005     GU PMH: Bladder tumor/neoplasm - 04/24/2021, - 01/03/2021, - 10/04/2020, - 07/16/2020, - 2020 Renal calculus - 04/24/2021, - 01/03/2021, - 07/16/2020, - 2019 Renal and ureteral calculus - 2020 Microscopic hematuria - 2019, - 2019      PMH Notes: (R) [place]   NON-GU PMH: Anxiety Hypertension    FAMILY HISTORY: High Blood Pressure - Father   SOCIAL HISTORY: Marital Status: Single Current Smoking Status: Patient smokes. Has smoked since 02/23/2014. Smokes 2 packs per day.   Tobacco Use Assessment Completed: Used Tobacco in last 30 days? Has never drank.  Drinks 3 caffeinated drinks per day. Patient's occupation Haematologist.    REVIEW OF SYSTEMS:    GU Review Male:   Patient denies frequent urination, hard to postpone urination, burning/ pain with urination, get up at night to urinate, leakage of urine, stream starts and stops, trouble starting your stream, have to strain to urinate , erection problems, and penile pain.  Gastrointestinal (Upper):   Patient denies nausea, vomiting, and indigestion/ heartburn.  Gastrointestinal (Lower):   Patient denies diarrhea and constipation.  Constitutional:   Patient denies fever, night sweats, weight loss, and fatigue.  Skin:   Patient denies skin rash/ lesion and itching.  Eyes:   Patient denies blurred vision and double vision.  Ears/ Nose/ Throat:   Patient denies sinus problems and  sore throat.  Hematologic/Lymphatic:   Patient denies swollen glands and easy bruising.  Cardiovascular:   Patient denies leg swelling and chest pains.  Respiratory:   Patient denies cough and shortness of breath.  Endocrine:   Patient denies excessive thirst.  Musculoskeletal:   Patient denies back pain and joint pain.  Neurological:   Patient denies headaches and dizziness.  Psychologic:   Patient denies depression and anxiety.   BP (!) 151/91   Pulse 74   Temp 98.9 F (37.2 C) (Oral)   Resp 16   Ht 6' 1"  (1.854 m)   Wt 112 kg   SpO2 99%   BMI 32.57 kg/m    GU PHYSICAL EXAMINATION:    No acute distress Adequate perfusion of extremities Nonlabored respiration  MULTI-SYSTEM PHYSICAL EXAMINATION:       Complexity of Data:  Source Of History:  Patient  Records Review:   Previous Doctor Records, Previous Patient Records  Urine Test Review:   Urinalysis  X-Ray Review: KUB: Reviewed Films. Discussed With Patient.    Prior cystoscopy at the last visit:  Meatus:  Normal size. Normal location. Normal condition.  Urethra:  No strictures.  External Sphincter:  Normal.  Verumontanum:  Normal.  Prostate:  Borderline obstructing. Mild hyperplasia.  Bladder Neck:  Non-obstructing.  Ureteral Orifices:  Normal location. Normal size. Normal shape. Evidence of prior resection over the left ureteral orifice  Bladder:  No trabeculation. He had an approximately 2 cm bladder tumor papillary nature on the left lateral wall. Tiny subtle papillary tumor approximately 2 cm on the posterior bladder wall towards the left.          ASSESSMENT:  Bladder tumor  Plan for cystoscopy with bilateral retrograde pyelogram, TURBT with instillation of gemcitabine. Risk and benefits discussed.

## 2021-12-15 NOTE — Transfer of Care (Signed)
Immediate Anesthesia Transfer of Care Note  Patient: Casey [name]  Procedure(s) Performed: Procedure(s) (LRB): TRANSURETHRAL RESECTION OF BLADDER TUMOR BILATERAL RETROGRADE PYELOGRAM WITH GEMCITABINE (Bilateral)  Patient Location: PACU  Anesthesia Type: General  Level of Consciousness: awake, oriented, sedated and patient cooperative  Airway & Oxygen Therapy: Patient Spontanous Breathing and Patient connected tonasal cannula oxygen  Post-op Assessment: Report given to PACU RN and Post -op Vital signs reviewed and stable  Post vital signs: Reviewed and stable  Complications: No apparent anesthesia complications  Last Vitals:  Vitals Value Taken Time  BP    Temp 36.4 C 12/15/21 0839  Pulse    Resp    SpO2      Last Pain:  Vitals:   12/15/21 4680  TempSrc: Oral  PainSc: 0-No pain      Patients Stated Pain Goal: 7 (32/12/24 8250)  Complications: No notable events documented.

## 2021-12-15 NOTE — Anesthesia Preprocedure Evaluation (Addendum)
Anesthesia Evaluation  Patient identified by MRN, date of birth, ID band Patient awake    Reviewed: Allergy & Precautions, NPO status , Patient's Chart, lab work & pertinent test results  History of Anesthesia Complications Negative for: history of anesthetic complications  Airway Mallampati: II  TM Distance: >3 FB Neck ROM: Full    Dental  (+) Dental Advisory Given, Caps, Partial Upper,    Pulmonary neg shortness of breath, sleep apnea and Continuous Positive Airway Pressure Ventilation , neg COPD, neg recent URI, former smoker,    breath sounds clear to auscultation       Cardiovascular hypertension, Pt. on medications (-) angina(-) Past MI + dysrhythmias  Rhythm:Regular  EKG 09/27/20 NSR, RBBB pattern   Neuro/Psych negative neurological ROS  negative psych ROS   GI/Hepatic Neg liver ROS, GERD  Medicated,  Endo/Other  diabetes, Type 2Lab Results      Component                Value               Date                      HGBA1C                   7.2 (H)             11/19/2021              Renal/GU Hx/o renal calculi  Lab Results      Component                Value               Date                      CREATININE               0.60 (L)            12/15/2021           \   Bladder tumor    Musculoskeletal Hx/o eczema   Abdominal (+) + obese,   Peds  Hematology   Anesthesia Other Findings   Reproductive/Obstetrics                            Anesthesia Physical Anesthesia Plan  ASA: 2  Anesthesia Plan: General   Post-op Pain Management: Ofirmev IV (intra-op)*   Induction: Intravenous  PONV Risk Score and Plan: 2 and Ondansetron and Dexamethasone  Airway Management Planned: Oral ETT and LMA  Additional Equipment: None  Intra-op Plan:   Post-operative Plan: Extubation in OR  Informed Consent: I have reviewed the patients History and Physical, chart, labs and discussed the  procedure including the risks, benefits and alternatives for the proposed anesthesia with the patient or authorized representative who has indicated his/her understanding and acceptance.     Dental advisory given  Plan Discussed with: CRNA  Anesthesia Plan Comments:         Anesthesia Quick Evaluation

## 2021-12-15 NOTE — Anesthesia Procedure Notes (Signed)
Procedure Name: Intubation Date/Time: 12/15/2021 7:47 AM  Performed by: Mechele Claude, CRNAPre-anesthesia Checklist: Patient identified, Emergency Drugs available, Suction available and Patient being monitored Patient Re-evaluated:Patient Re-evaluated prior to induction Oxygen Delivery Method: Circle system utilized Preoxygenation: Pre-oxygenation with 100% oxygen Induction Type: IV induction Ventilation: Mask ventilation without difficulty Laryngoscope Size: Mac and 4 Grade View: Grade I Tube type: Oral Tube size: 7.5 mm Number of attempts: 1 Airway Equipment and Method: Stylet and Oral airway Placement Confirmation: ETT inserted through vocal cords under direct vision, positive ETCO2 and breath sounds checked- equal and bilateral Secured at: 23 cm Tube secured with: Tape Dental Injury: Teeth and Oropharynx as per pre-operative assessment

## 2021-12-16 ENCOUNTER — Encounter (HOSPITAL_BASED_OUTPATIENT_CLINIC_OR_DEPARTMENT_OTHER): Payer: Self-pay | Admitting: Urology

## 2021-12-16 LAB — SURGICAL PATHOLOGY

## 2021-12-23 ENCOUNTER — Ambulatory Visit: Payer: BC Managed Care – PPO | Admitting: Podiatry

## 2021-12-23 DIAGNOSIS — L600 Ingrowing nail: Secondary | ICD-10-CM | POA: Diagnosis not present

## 2021-12-23 DIAGNOSIS — E118 Type 2 diabetes mellitus with unspecified complications: Secondary | ICD-10-CM

## 2021-12-23 NOTE — Progress Notes (Signed)
  Subjective:  Patient ID: Casey [name], male    DOB: 05-27-1965,  MRN: [id number]  Chief Complaint  Patient presents with   Nail Problem    New patient ingrowing nails   Diabetes    Diabetic foot care, A1C  7.2    56 y.o. male presents with the above complaint. History confirmed with patient.  His nails do get ingrown and cause quite a bit of pain sometimes, he had his A1c checked recently was 7.2% he is on medication now.  Has a recheck in December  Objective:  Physical Exam: warm, good capillary refill, no trophic changes or ulcerative lesions, normal DP and PT pulses, normal sensory exam, and ingrown nail no paronychia bilateral hallux  Assessment:   1. Ingrowing right great toenail   2. Ingrowing left great toenail   3. Type II diabetes mellitus with manifestations ([place])      Plan:  Patient was evaluated and treated and all questions answered.  Patient educated on diabetes. Discussed proper diabetic foot care and discussed risks and complications of disease. Educated patient in depth on reasons to return to the office immediately should he/she discover anything concerning or new on the feet. All questions answered. Discussed proper shoes as well.   We discussed treatment of ingrown nails including partial permanent matricectomy.  He is currently working on lowering his A1c and I recommend he have this checked as scheduled and he will follow-up with me after for partial permanent matricectomy which I think long-term will give him the best relief from his ingrown nails.  Return if symptoms worsen or fail to improve; when ready to schedule ingrown toenail removal procedure.

## 2021-12-31 ENCOUNTER — Encounter: Payer: Self-pay | Admitting: Gastroenterology

## 2022-01-01 DIAGNOSIS — D1723 Benign lipomatous neoplasm of skin and subcutaneous tissue of right leg: Secondary | ICD-10-CM | POA: Diagnosis not present

## 2022-01-01 DIAGNOSIS — L858 Other specified epidermal thickening: Secondary | ICD-10-CM | POA: Diagnosis not present

## 2022-01-01 DIAGNOSIS — L2089 Other atopic dermatitis: Secondary | ICD-10-CM | POA: Diagnosis not present

## 2022-01-05 ENCOUNTER — Telehealth: Payer: Self-pay | Admitting: Podiatry

## 2022-01-05 DIAGNOSIS — D414 Neoplasm of uncertain behavior of bladder: Secondary | ICD-10-CM | POA: Diagnosis not present

## 2022-01-05 DIAGNOSIS — Z5111 Encounter for antineoplastic chemotherapy: Secondary | ICD-10-CM | POA: Diagnosis not present

## 2022-01-05 NOTE — Telephone Encounter (Signed)
Pt called asking to get scheduled for  partial permanent matricectomy bil( left worse) can I schedule this as he is wanting to get scheduled for the procedure.He has met his out of pocket for the year.

## 2022-01-06 ENCOUNTER — Ambulatory Visit: Payer: BC Managed Care – PPO | Admitting: Internal Medicine

## 2022-01-08 ENCOUNTER — Other Ambulatory Visit (HOSPITAL_COMMUNITY): Payer: Self-pay

## 2022-01-09 ENCOUNTER — Other Ambulatory Visit: Payer: Self-pay | Admitting: Internal Medicine

## 2022-01-09 ENCOUNTER — Other Ambulatory Visit (HOSPITAL_COMMUNITY): Payer: Self-pay

## 2022-01-09 DIAGNOSIS — F411 Generalized anxiety disorder: Secondary | ICD-10-CM

## 2022-01-09 MED ORDER — ALPRAZOLAM 0.5 MG PO TABS: 0.5000 mg | ORAL_TABLET | Freq: Three times a day (TID) | ORAL | 2 refills | Status: AC | PRN

## 2022-01-09 MED ORDER — BETAMETHASONE DIPROPIONATE AUG 0.05 % EX CREA
TOPICAL_CREAM | CUTANEOUS | 1 refills | Status: AC
  Filled 2022-01-09: qty 50, 30d supply, fill #0
  Filled 2022-02-12: qty 50, 25d supply, fill #0
  Filled 2022-04-23: qty 50, 25d supply, fill #1

## 2022-01-12 DIAGNOSIS — Z5111 Encounter for antineoplastic chemotherapy: Secondary | ICD-10-CM | POA: Diagnosis not present

## 2022-01-12 DIAGNOSIS — C674 Malignant neoplasm of posterior wall of bladder: Secondary | ICD-10-CM | POA: Diagnosis not present

## 2022-01-19 DIAGNOSIS — Z5111 Encounter for antineoplastic chemotherapy: Secondary | ICD-10-CM | POA: Diagnosis not present

## 2022-01-19 DIAGNOSIS — D414 Neoplasm of uncertain behavior of bladder: Secondary | ICD-10-CM | POA: Diagnosis not present

## 2022-01-22 ENCOUNTER — Ambulatory Visit: Payer: BC Managed Care – PPO | Admitting: Podiatry

## 2022-01-22 ENCOUNTER — Other Ambulatory Visit (HOSPITAL_COMMUNITY): Payer: Self-pay

## 2022-01-22 DIAGNOSIS — L600 Ingrowing nail: Secondary | ICD-10-CM

## 2022-01-22 MED ORDER — NEOMYCIN-POLYMYXIN-HC 3.5-10000-1 OT SUSP
OTIC | 0 refills | Status: DC
  Filled 2022-01-22: qty 10, 90d supply, fill #0

## 2022-01-22 NOTE — Patient Instructions (Signed)

## 2022-01-22 NOTE — Progress Notes (Signed)
  Subjective:  Patient ID: Casey [name], male    DOB: June 16, 1965,  MRN: [id number]  Chief Complaint  Patient presents with   Ingrown Toenail    partial permanent matricectomy bil( left worse) - nails actually feel better than they normally do    56 y.o. male presents with the above complaint. History confirmed with patient.  He returns today for removal of his ingrown toenails his A1c is doing well and is 7.2%  Objective:  Physical Exam: warm, good capillary refill, no trophic changes or ulcerative lesions, normal DP and PT pulses, normal sensory exam, and bilateral hallux dystrophic and ingrown incurvated borders no signs of infection or paronychia noted bilateral both borders.  Assessment:   1. Ingrowing right great toenail   2. Ingrowing left great toenail      Plan:  Patient was evaluated and treated and all questions answered.    Ingrown Nail, bilaterally -Patient elects to proceed with minor surgery to remove ingrown toenail today. Consent reviewed and signed by patient. -Ingrown nail excised. See procedure note. -Educated on post-procedure care including soaking. Written instructions provided and reviewed. -Rx for Cortisporin sent to pharmacy. -Advised on signs and symptoms of infection developing.  We discussed that the phenol likely will create some redness and edema and tenderness around the nailbed as long as it is localized this is to be expected.  Will return as needed if any infection signs develop  Procedure: Excision of Ingrown Toenail Location: Bilateral 1st toe  medial and lateral  nail borders. Anesthesia: Lidocaine 1% plain; 1.5 mL and Marcaine 0.5% plain; 1.5 mL, digital block. Skin Prep: Betadine. Dressing: Silvadene; telfa; dry, sterile, compression dressing. Technique: Following skin prep, the toe was exsanguinated and a tourniquet was secured at the base of the toe. The affected nail border was freed, split with a nail splitter, and excised. Chemical  matrixectomy was then performed with phenol and irrigated out with alcohol. The tourniquet was then removed and sterile dressing applied. Disposition: Patient tolerated procedure well.    No follow-ups on file.

## 2022-01-26 DIAGNOSIS — Z5111 Encounter for antineoplastic chemotherapy: Secondary | ICD-10-CM | POA: Diagnosis not present

## 2022-01-26 DIAGNOSIS — D414 Neoplasm of uncertain behavior of bladder: Secondary | ICD-10-CM | POA: Diagnosis not present

## 2022-02-02 ENCOUNTER — Other Ambulatory Visit (HOSPITAL_COMMUNITY): Payer: Self-pay

## 2022-02-02 DIAGNOSIS — D414 Neoplasm of uncertain behavior of bladder: Secondary | ICD-10-CM | POA: Diagnosis not present

## 2022-02-02 DIAGNOSIS — Z5111 Encounter for antineoplastic chemotherapy: Secondary | ICD-10-CM | POA: Diagnosis not present

## 2022-02-05 ENCOUNTER — Other Ambulatory Visit (HOSPITAL_COMMUNITY): Payer: Self-pay

## 2022-02-05 DIAGNOSIS — D1723 Benign lipomatous neoplasm of skin and subcutaneous tissue of right leg: Secondary | ICD-10-CM | POA: Diagnosis not present

## 2022-02-05 DIAGNOSIS — L723 Sebaceous cyst: Secondary | ICD-10-CM | POA: Diagnosis not present

## 2022-02-05 MED ORDER — DOXYCYCLINE HYCLATE 100 MG PO CAPS
100.0000 mg | ORAL_CAPSULE | Freq: Two times a day (BID) | ORAL | 0 refills | Status: DC
  Filled 2022-02-05: qty 14, 7d supply, fill #0

## 2022-02-09 DIAGNOSIS — Z5111 Encounter for antineoplastic chemotherapy: Secondary | ICD-10-CM | POA: Diagnosis not present

## 2022-02-09 DIAGNOSIS — C674 Malignant neoplasm of posterior wall of bladder: Secondary | ICD-10-CM | POA: Diagnosis not present

## 2022-02-10 ENCOUNTER — Encounter: Payer: BC Managed Care – PPO | Admitting: Gastroenterology

## 2022-02-11 ENCOUNTER — Encounter: Payer: BC Managed Care – PPO | Admitting: Gastroenterology

## 2022-02-12 ENCOUNTER — Other Ambulatory Visit: Payer: Self-pay

## 2022-02-12 ENCOUNTER — Ambulatory Visit: Payer: BC Managed Care – PPO | Admitting: Podiatry

## 2022-02-12 ENCOUNTER — Other Ambulatory Visit (HOSPITAL_COMMUNITY): Payer: Self-pay

## 2022-02-13 ENCOUNTER — Other Ambulatory Visit: Payer: Self-pay

## 2022-02-17 ENCOUNTER — Other Ambulatory Visit (HOSPITAL_COMMUNITY): Payer: Self-pay

## 2022-02-18 ENCOUNTER — Ambulatory Visit: Payer: BC Managed Care – PPO | Admitting: Internal Medicine

## 2022-02-18 ENCOUNTER — Other Ambulatory Visit (HOSPITAL_COMMUNITY): Payer: Self-pay

## 2022-02-18 ENCOUNTER — Encounter: Payer: Self-pay | Admitting: Internal Medicine

## 2022-02-18 VITALS — BP 142/92 | HR 75 | Temp 98.9°F | Resp 16 | Ht 73.0 in | Wt 249.0 lb

## 2022-02-18 DIAGNOSIS — K7581 Nonalcoholic steatohepatitis (NASH): Secondary | ICD-10-CM | POA: Diagnosis not present

## 2022-02-18 DIAGNOSIS — Z23 Encounter for immunization: Secondary | ICD-10-CM

## 2022-02-18 DIAGNOSIS — E118 Type 2 diabetes mellitus with unspecified complications: Secondary | ICD-10-CM | POA: Diagnosis not present

## 2022-02-18 DIAGNOSIS — I10 Essential (primary) hypertension: Secondary | ICD-10-CM

## 2022-02-18 LAB — MICROALBUMIN / CREATININE URINE RATIO
Creatinine,U: 111.3 mg/dL
Microalb Creat Ratio: 1.9 mg/g (ref 0.0–30.0)
Microalb, Ur: 2.2 mg/dL — ABNORMAL HIGH (ref 0.0–1.9)

## 2022-02-18 LAB — BASIC METABOLIC PANEL
BUN: 9 mg/dL (ref 6–23)
CO2: 26 mEq/L (ref 19–32)
Calcium: 9.2 mg/dL (ref 8.4–10.5)
Chloride: 104 mEq/L (ref 96–112)
Creatinine, Ser: 0.76 mg/dL (ref 0.40–1.50)
GFR: 100.37 mL/min (ref >60.00)
Glucose, Bld: 169 mg/dL — ABNORMAL HIGH (ref 70–99)
Potassium: 3.9 mEq/L (ref 3.5–5.1)
Sodium: 137 mEq/L (ref 135–145)

## 2022-02-18 LAB — HEMOGLOBIN A1C: Hgb A1c MFr Bld: 6 % (ref 4.6–6.5)

## 2022-02-18 MED ORDER — OLMESARTAN MEDOXOMIL 20 MG PO TABS
20.0000 mg | ORAL_TABLET | Freq: Every day | ORAL | 1 refills | Status: DC
  Filled 2022-02-18: qty 90, 90d supply, fill #0
  Filled 2022-05-11: qty 90, 90d supply, fill #1

## 2022-02-18 NOTE — Progress Notes (Signed)
Subjective:  Patient ID: Casey [name], male    DOB: November 10, 1965  Age: 56 y.o. MRN: [id number]  CC: Hypertension and Diabetes   HPI Casey [name] presents for f/up -  He is active and denies dizziness, lightheadedness, chest pain, shortness of breath, or edema.  Outpatient Medications Prior to Visit  Medication Sig Dispense Refill   ALPRAZolam (XANAX) 0.5 MG tablet Take 1 tablet (0.5 mg total) by mouth 3 (three) times daily as needed for anxiety. 90 tablet 2   augmented betamethasone dipropionate (DIPROLENE-AF) 0.05 % cream Apply to affected area twice daily as needed. 50 g 1   metFORMIN (GLUCOPHAGE-XR) 750 MG 24 hr tablet Take 1 tablet (750 mg total) by mouth daily with breakfast. 90 tablet 0   rosuvastatin (CRESTOR) 5 MG tablet Take 1 tablet (5 mg total) by mouth daily. 90 tablet 0   doxycycline (VIBRAMYCIN) 100 MG capsule Take 1 capsule (100 mg total) by mouth 2 (two) times daily for 7 days (Patient not taking: Reported on 02/18/2022) 14 capsule 0   neomycin-polymyxin-hydrocortisone (CORTISPORIN) 3.5-10000-1 OTIC suspension Apply 1 - 2 drops to affected area daily after soaking and cover with bandaid 10 mL 0   No facility-administered medications prior to visit.    ROS Review of Systems  Constitutional:  Negative for chills, diaphoresis, fatigue and fever.  HENT: Negative.    Eyes: Negative.   Respiratory: Negative.  Negative for cough, chest tightness and shortness of breath.   Cardiovascular:  Negative for chest pain, palpitations and leg swelling.  Gastrointestinal:  Negative for abdominal pain, diarrhea, nausea and vomiting.  Endocrine: Negative.   Genitourinary: Negative.   Musculoskeletal: Negative.  Negative for arthralgias and myalgias.  Skin: Negative.   Neurological: Negative.  Negative for dizziness.  Hematological:  Negative for adenopathy. Does not bruise/bleed easily.  Psychiatric/Behavioral:  Negative for decreased concentration, dysphoric mood and sleep  disturbance. The patient is nervous/anxious.     Objective:  BP (!) 142/92 (BP Location: Right Arm, Patient Position: Sitting, Cuff Size: Normal)   Pulse 75   Temp 98.9 F (37.2 C) (Oral)   Resp 16   Ht 6' 1"  (1.854 m)   Wt 249 lb (112.9 kg)   SpO2 98%   BMI 32.85 kg/m   BP Readings from Last 3 Encounters:  02/18/22 (!) 142/92  12/15/21 (!) 142/93  11/19/21 118/72    Wt Readings from Last 3 Encounters:  02/18/22 249 lb (112.9 kg)  12/15/21 246 lb 14.4 oz (112 kg)  11/27/21 249 lb (112.9 kg)    Physical Exam Vitals reviewed.  HENT:     Nose: Nose normal.     Mouth/Throat:     Mouth: Mucous membranes are moist.  Eyes:     General: No scleral icterus.    Conjunctiva/sclera: Conjunctivae normal.  Cardiovascular:     Rate and Rhythm: Normal rate and regular rhythm.     Heart sounds: No murmur heard. Pulmonary:     Effort: Pulmonary effort is normal.     Breath sounds: No stridor. No wheezing, rhonchi or rales.  Abdominal:     General: Abdomen is flat.     Palpations: There is no mass.     Tenderness: There is no abdominal tenderness. There is no guarding.     Hernia: No hernia is present.  Musculoskeletal:        General: Normal range of motion.     Cervical back: Neck supple.     Right lower leg: No edema.  Left lower leg: No edema.  Lymphadenopathy:     Cervical: No cervical adenopathy.  Skin:    General: Skin is warm and dry.  Neurological:     General: No focal deficit present.     Mental Status: He is alert.  Psychiatric:        Mood and Affect: Mood normal.        Behavior: Behavior normal.     Lab Results  Component Value Date   WBC 4.3 11/19/2021   HGB 14.3 12/15/2021   HCT 42.0 12/15/2021   PLT 181.0 11/19/2021   GLUCOSE 169 (H) 02/18/2022   CHOL 110 11/19/2021   TRIG 156.0 (H) 11/19/2021   HDL 24.20 (L) 11/19/2021   LDLCALC 55 11/19/2021   ALT 52 11/19/2021   AST 44 (H) 11/19/2021   NA 137 02/18/2022   K 3.9 02/18/2022   CL 104  02/18/2022   CREATININE 0.76 02/18/2022   BUN 9 02/18/2022   CO2 26 02/18/2022   TSH 2.09 11/19/2021   PSA 0.29 11/19/2021   HGBA1C 6.0 02/18/2022   MICROALBUR 2.2 (H) 02/18/2022    No results found.  Assessment & Plan:   Casey [name] was seen today for hypertension and diabetes.  Diagnoses and all orders for this visit:  Type II diabetes mellitus with manifestations ([place])- His blood sugar is well-controlled. -     Microalbumin / creatinine urine ratio; Future -     Basic metabolic panel; Future -     Hemoglobin A1c; Future -     olmesartan (BENICAR) 20 MG tablet; Take 1 tablet (20 mg total) by mouth daily. -     Hemoglobin A1c -     Basic metabolic panel -     Microalbumin / creatinine urine ratio  Primary hypertension- His blood pressure is well-controlled. -     Basic metabolic panel; Future -     olmesartan (BENICAR) 20 MG tablet; Take 1 tablet (20 mg total) by mouth daily. -     Basic metabolic panel  Need for pneumococcal 20-valent conjugate vaccination -     Pneumococcal conjugate vaccine 20-valent (Prevnar 20)  NASH (nonalcoholic steatohepatitis)- He is working on his lifestyle modifications.   I have discontinued Casey [name]'s neomycin-polymyxin-hydrocortisone. I am also having him start on olmesartan. Additionally, I am having him maintain his rosuvastatin, metFORMIN, augmented betamethasone dipropionate, ALPRAZolam, and doxycycline.  Meds ordered this encounter  Medications   olmesartan (BENICAR) 20 MG tablet    Sig: Take 1 tablet (20 mg total) by mouth daily.    Dispense:  90 tablet    Refill:  1     Follow-up: Return in about 6 months (around 08/20/2022).  Casey Calico, MD

## 2022-02-18 NOTE — Patient Instructions (Signed)
Hypertension, Adult High blood pressure (hypertension) is when the force of blood pumping through the arteries is too strong. The arteries are the blood vessels that carry blood from the heart throughout the body. Hypertension forces the heart to work harder to pump blood and may cause arteries to become narrow or stiff. Untreated or uncontrolled hypertension can lead to a heart attack, heart failure, a stroke, kidney disease, and other problems. A blood pressure reading consists of a higher number over a lower number. Ideally, your blood pressure should be below 120/80. The first ("top") number is called the systolic pressure. It is a measure of the pressure in your arteries as your heart beats. The second ("bottom") number is called the diastolic pressure. It is a measure of the pressure in your arteries as the heart relaxes. What are the causes? The exact cause of this condition is not known. There are some conditions that result in high blood pressure. What increases the risk? Certain factors may make you more likely to develop high blood pressure. Some of these risk factors are under your control, including: Smoking. Not getting enough exercise or physical activity. Being overweight. Having too much fat, sugar, calories, or salt (sodium) in your diet. Drinking too much alcohol. Other risk factors include: Having a personal history of heart disease, diabetes, high cholesterol, or kidney disease. Stress. Having a family history of high blood pressure and high cholesterol. Having obstructive sleep apnea. Age. The risk increases with age. What are the signs or symptoms? High blood pressure may not cause symptoms. Very high blood pressure (hypertensive crisis) may cause: Headache. Fast or irregular heartbeats (palpitations). Shortness of breath. Nosebleed. Nausea and vomiting. Vision changes. Severe chest pain, dizziness, and seizures. How is this diagnosed? This condition is diagnosed by  measuring your blood pressure while you are seated, with your arm resting on a flat surface, your legs uncrossed, and your feet flat on the floor. The cuff of the blood pressure monitor will be placed directly against the skin of your upper arm at the level of your heart. Blood pressure should be measured at least twice using the same arm. Certain conditions can cause a difference in blood pressure between your right and left arms. If you have a high blood pressure reading during one visit or you have normal blood pressure with other risk factors, you may be asked to: Return on a different day to have your blood pressure checked again. Monitor your blood pressure at home for 1 week or longer. If you are diagnosed with hypertension, you may have other blood or imaging tests to help your health care provider understand your overall risk for other conditions. How is this treated? This condition is treated by making healthy lifestyle changes, such as eating healthy foods, exercising more, and reducing your alcohol intake. You may be referred for counseling on a healthy diet and physical activity. Your health care provider may prescribe medicine if lifestyle changes are not enough to get your blood pressure under control and if: Your systolic blood pressure is above 130. Your diastolic blood pressure is above 80. Your personal target blood pressure may vary depending on your medical conditions, your age, and other factors. Follow these instructions at home: Eating and drinking  Eat a diet that is high in fiber and potassium, and low in sodium, added sugar, and fat. An example of this eating plan is called the DASH diet. DASH stands for Dietary Approaches to Stop Hypertension. To eat this way: Eat   plenty of fresh fruits and vegetables. Try to fill one half of your plate at each meal with fruits and vegetables. Eat whole grains, such as whole-wheat pasta, brown rice, or whole-grain bread. Fill about one  fourth of your plate with whole grains. Eat or drink low-fat dairy products, such as skim milk or low-fat yogurt. Avoid fatty cuts of meat, processed or cured meats, and poultry with skin. Fill about one fourth of your plate with lean proteins, such as fish, chicken without skin, beans, eggs, or tofu. Avoid pre-made and processed foods. These tend to be higher in sodium, added sugar, and fat. Reduce your daily sodium intake. Many people with hypertension should eat less than 1,500 mg of sodium a day. Do not drink alcohol if: Your health care provider tells you not to drink. You are pregnant, may be pregnant, or are planning to become pregnant. If you drink alcohol: Limit how much you have to: 0-1 drink a day for women. 0-2 drinks a day for men. Know how much alcohol is in your drink. In the [place] one drink equals one 12 oz bottle of beer (355 mL), one 5 oz glass of wine (148 mL), or one 1 oz glass of hard liquor (44 mL). Lifestyle  Work with your health care provider to maintain a healthy body weight or to lose weight. Ask what an ideal weight is for you. Get at least 30 minutes of exercise that causes your heart to beat faster (aerobic exercise) most days of the week. Activities may include walking, swimming, or biking. Include exercise to strengthen your muscles (resistance exercise), such as Pilates or lifting weights, as part of your weekly exercise routine. Try to do these types of exercises for 30 minutes at least 3 days a week. Do not use any products that contain nicotine or tobacco. These products include cigarettes, chewing tobacco, and vaping devices, such as e-cigarettes. If you need help quitting, ask your health care provider. Monitor your blood pressure at home as told by your health care provider. Keep all follow-up visits. This is important. Medicines Take over-the-counter and prescription medicines only as told by your health care provider. Follow directions carefully. Blood  pressure medicines must be taken as prescribed. Do not skip doses of blood pressure medicine. Doing this puts you at risk for problems and can make the medicine less effective. Ask your health care provider about side effects or reactions to medicines that you should watch for. Contact a health care provider if you: Think you are having a reaction to a medicine you are taking. Have headaches that keep coming back (recurring). Feel dizzy. Have swelling in your ankles. Have trouble with your vision. Get help right away if you: Develop a severe headache or confusion. Have unusual weakness or numbness. Feel faint. Have severe pain in your chest or abdomen. Vomit repeatedly. Have trouble breathing. These symptoms may be an emergency. Get help right away. Call 911. Do not wait to see if the symptoms will go away. Do not drive yourself to the hospital. Summary Hypertension is when the force of blood pumping through your arteries is too strong. If this condition is not controlled, it may put you at risk for serious complications. Your personal target blood pressure may vary depending on your medical conditions, your age, and other factors. For most people, a normal blood pressure is less than 120/80. Hypertension is treated with lifestyle changes, medicines, or a combination of both. Lifestyle changes include losing weight, eating a healthy,   low-sodium diet, exercising more, and limiting alcohol. This information is not intended to replace advice given to you by your health care provider. Make sure you discuss any questions you have with your health care provider. Document Revised: 12/17/2020 Document Reviewed: 12/17/2020 Elsevier Patient Education  2023 Elsevier Inc.  

## 2022-02-25 ENCOUNTER — Encounter: Payer: Self-pay | Admitting: Internal Medicine

## 2022-02-25 ENCOUNTER — Ambulatory Visit (AMBULATORY_SURGERY_CENTER): Payer: BC Managed Care – PPO

## 2022-02-25 VITALS — Ht 73.0 in | Wt 249.0 lb

## 2022-02-25 DIAGNOSIS — Z86010 Personal history of colonic polyps: Secondary | ICD-10-CM

## 2022-02-25 MED ORDER — NA SULFATE-K SULFATE-MG SULF 17.5-3.13-1.6 GM/177ML PO SOLN
1.0000 | Freq: Once | ORAL | 0 refills | Status: AC
  Filled 2022-02-25: qty 354, 1d supply, fill #0

## 2022-02-25 NOTE — Progress Notes (Signed)

## 2022-02-26 ENCOUNTER — Other Ambulatory Visit (HOSPITAL_COMMUNITY): Payer: Self-pay

## 2022-02-26 ENCOUNTER — Encounter: Payer: Self-pay | Admitting: Internal Medicine

## 2022-03-17 ENCOUNTER — Encounter: Payer: Self-pay | Admitting: Gastroenterology

## 2022-03-24 ENCOUNTER — Encounter: Payer: Self-pay | Admitting: Gastroenterology

## 2022-03-24 ENCOUNTER — Ambulatory Visit (AMBULATORY_SURGERY_CENTER): Payer: BC Managed Care – PPO | Admitting: Gastroenterology

## 2022-03-24 VITALS — BP 104/74 | HR 82 | Resp 16

## 2022-03-24 DIAGNOSIS — D122 Benign neoplasm of ascending colon: Secondary | ICD-10-CM

## 2022-03-24 DIAGNOSIS — Z86010 Personal history of colonic polyps: Secondary | ICD-10-CM

## 2022-03-24 DIAGNOSIS — Z09 Encounter for follow-up examination after completed treatment for conditions other than malignant neoplasm: Secondary | ICD-10-CM | POA: Diagnosis not present

## 2022-03-24 DIAGNOSIS — K635 Polyp of colon: Secondary | ICD-10-CM

## 2022-03-24 DIAGNOSIS — Z1211 Encounter for screening for malignant neoplasm of colon: Secondary | ICD-10-CM | POA: Diagnosis not present

## 2022-03-24 MED ORDER — SODIUM CHLORIDE 0.9 % IV SOLN: 500.0000 mL | INTRAVENOUS | Status: DC

## 2022-03-24 NOTE — Op Note (Addendum)
[facility] Patient Name: Casey [name] Procedure Date: 03/24/2022 8:46 AM MRN: [id number] Endoscopist: Remo Lipps P. Havery Moros , MD, [id number] Age: 57 Referring MD:  Date of Birth: 08/04/1965 Gender: Male Account #: [id number] Procedure:                Colonoscopy Indications:              High risk colon cancer surveillance: Personal                            history of colonic polyps - adenomas removed 01/2017 Medicines:                Monitored Anesthesia Care Procedure:                Pre-Anesthesia Assessment:                           - Prior to the procedure, a History and Physical                            was performed, and patient medications and                            allergies were reviewed. The patient's tolerance of                            previous anesthesia was also reviewed. The risks                            and benefits of the procedure and the sedation                            options and risks were discussed with the patient.                            All questions were answered, and informed consent                            was obtained. Prior Anticoagulants: The patient has                            taken no anticoagulant or antiplatelet agents. ASA                            Grade Assessment: II - A patient with mild systemic                            disease. After reviewing the risks and benefits,                            the patient was deemed in satisfactory condition to                            undergo the procedure.  After obtaining informed consent, the colonoscope                            was passed under direct vision. Throughout the                            procedure, the patient's blood pressure, pulse, and                            oxygen saturations were monitored continuously. The                            CF [id number] #[id number] was introduced through the anus                            and  advanced to the the cecum, identified by                            appendiceal orifice and ileocecal valve. The                            colonoscopy was performed without difficulty. The                            patient tolerated the procedure well. The quality                            of the bowel preparation was good. The ileocecal                            valve, appendiceal orifice, and rectum were                            photographed. Scope In: 8:55:08 AM Scope Out: 9:15:36 AM Scope Withdrawal Time: 0 hours 16 minutes 48 seconds  Total Procedure Duration: 0 hours 20 minutes 28 seconds  Findings:                 The perianal and digital rectal examinations were                            normal.                           A few small-mouthed diverticula were found in the                            sigmoid colon.                           A 4 mm polyp was found in the ascending colon. The                            polyp was flat. The polyp was removed with a cold  snare. Resection and retrieval were complete.                           Internal hemorrhoids were found during                            retroflexion. The hemorrhoids were small.                           The exam was otherwise without abnormality. Complications:            No immediate complications. Estimated blood loss:                            Minimal. Estimated Blood Loss:     Estimated blood loss was minimal. Impression:               - Diverticulosis in the sigmoid colon.                           - One 4 mm polyp in the ascending colon, removed                            with a cold snare. Resected and retrieved.                           - Internal hemorrhoids.                           - The examination was otherwise normal. Recommendation:           - Patient has a contact number available for                            emergencies. The signs and symptoms of potential                             delayed complications were discussed with the                            patient. Return to normal activities tomorrow.                            Written discharge instructions were provided to the                            patient.                           - Resume previous diet.                           - Continue present medications.                           - Await pathology results. Remo Lipps P. Hussam Muniz, MD 03/24/2022 9:19:41 AM This report has been signed electronically.

## 2022-03-24 NOTE — Progress Notes (Signed)
Called to room to assist during endoscopic procedure.  Patient ID and intended procedure confirmed with present staff. Received instructions for my participation in the procedure from the performing physician.  

## 2022-03-24 NOTE — Progress Notes (Signed)
To pacu, VSS. Report to [email or web address] 

## 2022-03-24 NOTE — Progress Notes (Signed)
[facility] History and Physical   Primary Care Physician:  Janith Lima, MD   Reason for Procedure:   History of colon polyps  Plan:    colonoscopy     HPI: Casey [name] is a 57 y.o. male  here for colonoscopy surveillance - 4 polyps removed 01/2017, including 2 adenomas.    Patient denies any bowel symptoms at this time. No family history of colon cancer known. Otherwise feels well without any cardiopulmonary symptoms.   I have discussed risks / benefits of anesthesia and endoscopic procedure with [name] and they wish to proceed with the exams as outlined today.    Past Medical History:  Diagnosis Date   Anxiety    Bladder tumor    Cancer ([place])    Diabetes mellitus without complication ([place])    does not check cbg regularly   Eczema    GERD (gastroesophageal reflux disease)    History of adenomatous polyp of colon    History of kidney stones    Hypertension    Nocturia    OSA (obstructive sleep apnea)    study in epic 08-02-2018;  followed by dr Rexene Alberts, does not use CPAP   RBBB (right bundle branch block)    Seasonal allergic rhinitis    Wears glasses     Past Surgical History:  Procedure Laterality Date   COLONOSCOPY WITH PROPOFOL  02/10/2017   EXTRACORPOREAL SHOCK WAVE LITHOTRIPSY Left 01/27/2018   Procedure: EXTRACORPOREAL SHOCK WAVE LITHOTRIPSY (ESWL);  Surgeon: Bjorn Loser, MD;  Location: [facility];  Service: Urology;  Laterality: Left;   LAPAROSCOPIC APPENDECTOMY  08/16/2003   '@[facility]'$    TRANSURETHRAL RESECTION OF BLADDER TUMOR  2020   TRANSURETHRAL RESECTION OF BLADDER TUMOR WITH MITOMYCIN-C Bilateral 09/27/2020   Procedure: TRANSURETHRAL RESECTION OF BLADDER TUMOR WITH GEMCITABINE BILATERAL RETROGRADE PYELOGRAM;  Surgeon: Lucas Mallow, MD;  Location: [facility];  Service: Urology;  Laterality: Bilateral;   TRANSURETHRAL RESECTION OF BLADDER TUMOR WITH MITOMYCIN-C Bilateral 12/15/2021   Procedure: TRANSURETHRAL  RESECTION OF BLADDER TUMOR BILATERAL RETROGRADE PYELOGRAM WITH GEMCITABINE;  Surgeon: Lucas Mallow, MD;  Location: [facility];  Service: Urology;  Laterality: Bilateral;  1 HR FOR CASE   URETEROSCOPY WITH HOLMIUM LASER LITHOTRIPSY  2020   WISDOM TOOTH EXTRACTION     age 11    Prior to Admission medications   Medication Sig Start Date End Date Taking? Authorizing Provider  augmented betamethasone dipropionate (DIPROLENE-AF) 0.05 % cream Apply to affected area twice daily as needed. 01/01/22  Yes   metFORMIN (GLUCOPHAGE-XR) 750 MG 24 hr tablet Take 1 tablet (750 mg total) by mouth daily with breakfast. 11/19/21  Yes Janith Lima, MD  olmesartan (BENICAR) 20 MG tablet Take 1 tablet (20 mg total) by mouth daily. 02/18/22  Yes Janith Lima, MD  rosuvastatin (CRESTOR) 5 MG tablet Take 1 tablet (5 mg total) by mouth daily. 11/19/21  Yes Janith Lima, MD  ALPRAZolam Duanne Moron) 0.5 MG tablet Take 1 tablet (0.5 mg total) by mouth 3 (three) times daily as needed for anxiety. 01/09/22   Janith Lima, MD    Current Outpatient Medications  Medication Sig Dispense Refill   augmented betamethasone dipropionate (DIPROLENE-AF) 0.05 % cream Apply to affected area twice daily as needed. 50 g 1   metFORMIN (GLUCOPHAGE-XR) 750 MG 24 hr tablet Take 1 tablet (750 mg total) by mouth daily with breakfast. 90 tablet 0   olmesartan (BENICAR) 20 MG tablet Take  1 tablet (20 mg total) by mouth daily. 90 tablet 1   rosuvastatin (CRESTOR) 5 MG tablet Take 1 tablet (5 mg total) by mouth daily. 90 tablet 0   ALPRAZolam (XANAX) 0.5 MG tablet Take 1 tablet (0.5 mg total) by mouth 3 (three) times daily as needed for anxiety. 90 tablet 2   Current Facility-Administered Medications  Medication Dose Route Frequency Provider Last Rate Last Admin   0.9 %  sodium chloride infusion  500 mL Intravenous Continuous Wessie Shanks, Carlota Raspberry, MD        Allergies as of 03/24/2022 - Review Complete 03/24/2022   Allergen Reaction Noted   Lisinopril Cough 11/19/2021    Family History  Problem Relation Age of Onset   CVA Mother    Allergic rhinitis Mother    Stroke Mother    Alcohol abuse Mother    Diabetes Mother    COPD Mother    Asthma Father        childhood   Hypertension Father    Bladder Cancer Father    Alcohol abuse Father    CVA Father    Lupus Maternal Aunt    COPD Maternal Grandmother    Colon cancer Neg Hx    Eczema Neg Hx    Urticaria Neg Hx    Angioedema Neg Hx    Colon polyps Neg Hx    Esophageal cancer Neg Hx    Prostate cancer Neg Hx    Rectal cancer Neg Hx    Stomach cancer Neg Hx     Social History   Socioeconomic History   Marital status: Single    Spouse name: Not on file   Number of children: Not on file   Years of education: Not on file   Highest education level: Not on file  Occupational History   Not on file  Tobacco Use   Smoking status: Former    Packs/day: 1.75    Years: 35.00    Total pack years: 61.25    Types: Cigarettes    Quit date: 11/24/2015    Years since quitting: 6.3   Smokeless tobacco: Never  Vaping Use   Vaping Use: Never used  Substance and Sexual Activity   Alcohol use: Yes    Comment: rare    Drug use: No   Sexual activity: Not on file  Other Topics Concern   Not on file  Social History Narrative   Not on file   Social Determinants of Health   Financial Resource Strain: Not on file  Food Insecurity: Not on file  Transportation Needs: Not on file  Physical Activity: Not on file  Stress: Not on file  Social Connections: Not on file  Intimate Partner Violence: Not on file    Review of Systems: All other review of systems negative except as mentioned in the HPI.  Physical Exam: Vital signs There were no vitals taken for this visit.  General:   Alert,  Well-developed, pleasant and cooperative in NAD Lungs:  Clear throughout to auscultation.   Heart:  Regular rate and rhythm Abdomen:  Soft, nontender and  nondistended.   Neuro/Psych:  Alert and cooperative. Normal mood and affect. A and O x 3  Jolly Mango, MD [facility]

## 2022-03-24 NOTE — Patient Instructions (Signed)
Discharge instructions given. Handouts on polyps,diverticulosis and Hemorrhoids. Resume previous medications. YOU HAD AN ENDOSCOPIC PROCEDURE TODAY AT THE [facility]:   Refer to the procedure report that was given to you for any specific questions about what was found during the examination.  If the procedure report does not answer your questions, please call your gastroenterologist to clarify.  If you requested that your care partner not be given the details of your procedure findings, then the procedure report has been included in a sealed envelope for you to review at your convenience later.  YOU SHOULD EXPECT: Some feelings of bloating in the abdomen. Passage of more gas than usual.  Walking can help get rid of the air that was put into your GI tract during the procedure and reduce the bloating. If you had a lower endoscopy (such as a colonoscopy or flexible sigmoidoscopy) you may notice spotting of blood in your stool or on the toilet paper. If you underwent a bowel prep for your procedure, you may not have a normal bowel movement for a few days.  Please Note:  You might notice some irritation and congestion in your nose or some drainage.  This is from the oxygen used during your procedure.  There is no need for concern and it should clear up in a day or so.  SYMPTOMS TO REPORT IMMEDIATELY:  Following lower endoscopy (colonoscopy or flexible sigmoidoscopy):  Excessive amounts of blood in the stool  Significant tenderness or worsening of abdominal pains  Swelling of the abdomen that is new, acute  Fever of 100F or higher   For urgent or emergent issues, a gastroenterologist can be reached at any hour by calling [phone]. Do not use MyChart messaging for urgent concerns.    DIET:  We do recommend a small meal at first, but then you may proceed to your regular diet.  Drink plenty of fluids but you should avoid alcoholic beverages for 24 hours.  ACTIVITY:  You should  plan to take it easy for the rest of today and you should NOT DRIVE or use heavy machinery until tomorrow (because of the sedation medicines used during the test).    FOLLOW UP: Our staff will call the number listed on your records the next business day following your procedure.  We will call around 7:15- 8:00 am to check on you and address any questions or concerns that you may have regarding the information given to you following your procedure. If we do not reach you, we will leave a message.     If any biopsies were taken you will be contacted by phone or by letter within the next 1-3 weeks.  Please call us at [phone] if you have not heard about the biopsies in 3 weeks.    SIGNATURES/CONFIDENTIALITY: You and/or your care partner have signed paperwork which will be entered into your electronic medical record.  These signatures attest to the fact that that the information above on your After Visit Summary has been reviewed and is understood.  Full responsibility of the confidentiality of this discharge information lies with you and/or your care-partner. 

## 2022-03-24 NOTE — Progress Notes (Signed)
Pt's states no medical or surgical changes since previsit or office visit. 

## 2022-03-25 ENCOUNTER — Emergency Department (HOSPITAL_BASED_OUTPATIENT_CLINIC_OR_DEPARTMENT_OTHER): Payer: BC Managed Care – PPO

## 2022-03-25 ENCOUNTER — Telehealth: Payer: Self-pay | Admitting: *Deleted

## 2022-03-25 ENCOUNTER — Encounter (HOSPITAL_COMMUNITY): Payer: Self-pay | Admitting: Emergency Medicine

## 2022-03-25 ENCOUNTER — Emergency Department (HOSPITAL_BASED_OUTPATIENT_CLINIC_OR_DEPARTMENT_OTHER)
Admission: EM | Admit: 2022-03-25 | Discharge: 2022-03-26 | Disposition: A | Discharge status: Home / Self Care | Payer: BC Managed Care – PPO | Attending: Emergency Medicine | Admitting: Emergency Medicine

## 2022-03-25 ENCOUNTER — Encounter (HOSPITAL_BASED_OUTPATIENT_CLINIC_OR_DEPARTMENT_OTHER): Payer: Self-pay

## 2022-03-25 ENCOUNTER — Ambulatory Visit (HOSPITAL_COMMUNITY)
Admission: EM | Admit: 2022-03-25 | Discharge: 2022-03-25 | Disposition: A | Discharge status: Home / Self Care | Payer: BC Managed Care – PPO

## 2022-03-25 ENCOUNTER — Other Ambulatory Visit: Payer: Self-pay

## 2022-03-25 DIAGNOSIS — R6510 Systemic inflammatory response syndrome (SIRS) of non-infectious origin without acute organ dysfunction: Secondary | ICD-10-CM | POA: Insufficient documentation

## 2022-03-25 DIAGNOSIS — N200 Calculus of kidney: Secondary | ICD-10-CM | POA: Diagnosis not present

## 2022-03-25 DIAGNOSIS — N10 Acute tubulo-interstitial nephritis: Secondary | ICD-10-CM | POA: Diagnosis not present

## 2022-03-25 DIAGNOSIS — N12 Tubulo-interstitial nephritis, not specified as acute or chronic: Secondary | ICD-10-CM | POA: Diagnosis not present

## 2022-03-25 DIAGNOSIS — E119 Type 2 diabetes mellitus without complications: Secondary | ICD-10-CM | POA: Diagnosis not present

## 2022-03-25 DIAGNOSIS — R112 Nausea with vomiting, unspecified: Secondary | ICD-10-CM | POA: Diagnosis not present

## 2022-03-25 DIAGNOSIS — R531 Weakness: Secondary | ICD-10-CM | POA: Diagnosis not present

## 2022-03-25 DIAGNOSIS — R509 Fever, unspecified: Secondary | ICD-10-CM | POA: Insufficient documentation

## 2022-03-25 DIAGNOSIS — K760 Fatty (change of) liver, not elsewhere classified: Secondary | ICD-10-CM | POA: Diagnosis not present

## 2022-03-25 DIAGNOSIS — Z9889 Other specified postprocedural states: Secondary | ICD-10-CM

## 2022-03-25 DIAGNOSIS — Z1152 Encounter for screening for COVID-19: Secondary | ICD-10-CM | POA: Insufficient documentation

## 2022-03-25 DIAGNOSIS — R5383 Other fatigue: Secondary | ICD-10-CM | POA: Insufficient documentation

## 2022-03-25 DIAGNOSIS — Z7984 Long term (current) use of oral hypoglycemic drugs: Secondary | ICD-10-CM | POA: Insufficient documentation

## 2022-03-25 DIAGNOSIS — K8020 Calculus of gallbladder without cholecystitis without obstruction: Secondary | ICD-10-CM | POA: Diagnosis not present

## 2022-03-25 LAB — COMPREHENSIVE METABOLIC PANEL
ALT: 22 U/L (ref 0–44)
AST: 23 U/L (ref 15–41)
Albumin: 4.4 g/dL (ref 3.5–5.0)
Alkaline Phosphatase: 56 U/L (ref 38–126)
Anion gap: 11 (ref 5–15)
BUN: 16 mg/dL (ref 6–20)
CO2: 25 mmol/L (ref 22–32)
Calcium: 9.6 mg/dL (ref 8.9–10.3)
Chloride: 98 mmol/L (ref 98–111)
Creatinine, Ser: 1.16 mg/dL (ref 0.61–1.24)
GFR, Estimated: >60 mL/min (ref >60)
Glucose, Bld: 144 mg/dL — ABNORMAL HIGH (ref 70–99)
Potassium: 3.6 mmol/L (ref 3.5–5.1)
Sodium: 134 mmol/L — ABNORMAL LOW (ref 135–145)
Total Bilirubin: 2.3 mg/dL — ABNORMAL HIGH (ref 0.3–1.2)
Total Protein: 7.9 g/dL (ref 6.5–8.1)

## 2022-03-25 LAB — RESP PANEL BY RT-PCR (RSV, FLU A&B, COVID)  RVPGX2
Influenza A by PCR: NEGATIVE
Influenza B by PCR: NEGATIVE
Resp Syncytial Virus by PCR: NEGATIVE
SARS Coronavirus 2 by RT PCR: NEGATIVE

## 2022-03-25 LAB — URINALYSIS, ROUTINE W REFLEX MICROSCOPIC
Bacteria, UA: NONE SEEN
Bilirubin Urine: NEGATIVE
Glucose, UA: NEGATIVE mg/dL
Hgb urine dipstick: NEGATIVE
Ketones, ur: 15 mg/dL — AB
Leukocytes,Ua: NEGATIVE
Nitrite: NEGATIVE
Protein, ur: 30 mg/dL — AB
Specific Gravity, Urine: >1.046 — ABNORMAL HIGH (ref 1.005–1.030)
pH: 6 (ref 5.0–8.0)

## 2022-03-25 LAB — CBC WITH DIFFERENTIAL/PLATELET
Abs Immature Granulocytes: 0.03 10*3/uL (ref 0.00–0.07)
Basophils Absolute: 0 10*3/uL (ref 0.0–0.1)
Basophils Relative: 0 %
Eosinophils Absolute: 0 10*3/uL (ref 0.0–0.5)
Eosinophils Relative: 0 %
HCT: 42.4 % (ref 39.0–52.0)
Hemoglobin: 15.3 g/dL (ref 13.0–17.0)
Immature Granulocytes: 0 %
Lymphocytes Relative: 10 %
Lymphs Abs: 1.1 10*3/uL (ref 0.7–4.0)
MCH: 34.8 pg — ABNORMAL HIGH (ref 26.0–34.0)
MCHC: 36.1 g/dL — ABNORMAL HIGH (ref 30.0–36.0)
MCV: 96.4 fL (ref 80.0–100.0)
Monocytes Absolute: 0.9 10*3/uL (ref 0.1–1.0)
Monocytes Relative: 8 %
Neutro Abs: 8.4 10*3/uL — ABNORMAL HIGH (ref 1.7–7.7)
Neutrophils Relative %: 82 %
Platelets: 141 10*3/uL — ABNORMAL LOW (ref 150–400)
RBC: 4.4 MIL/uL (ref 4.22–5.81)
RDW: 12.3 % (ref 11.5–15.5)
WBC: 10.4 10*3/uL (ref 4.0–10.5)
nRBC: 0 % (ref 0.0–0.2)

## 2022-03-25 LAB — LACTIC ACID, PLASMA: Lactic Acid, Venous: 1.8 mmol/L (ref 0.5–1.9)

## 2022-03-25 MED ORDER — IBUPROFEN 800 MG PO TABS
800.0000 mg | ORAL_TABLET | Freq: Once | ORAL | Status: AC
  Administered 2022-03-25: 800 mg via ORAL
  Filled 2022-03-25: qty 1

## 2022-03-25 MED ORDER — IOHEXOL 300 MG/ML  SOLN
100.0000 mL | Freq: Once | INTRAMUSCULAR | Status: AC | PRN
  Administered 2022-03-25: 100 mL via INTRAVENOUS

## 2022-03-25 MED ORDER — ONDANSETRON HCL 4 MG/2ML IJ SOLN
4.0000 mg | Freq: Once | INTRAMUSCULAR | Status: AC
  Administered 2022-03-25: 4 mg via INTRAVENOUS
  Filled 2022-03-25: qty 2

## 2022-03-25 NOTE — ED Triage Notes (Signed)
POV from home, GCS 15, A&O x4, amb to triage room.   Colonoscopy yesterday, fever and chills started last night. Weakness and fatigue as well. Sts fevers at home 103. Patient took tylenol approx 1700.

## 2022-03-25 NOTE — Telephone Encounter (Signed)
Patient is calling states his fever has come back and is having bad chills, is really tired, and threw up a little bit. Please advise

## 2022-03-25 NOTE — ED Notes (Signed)
Patient is being discharged from the Urgent Care and sent to the Emergency Department via POV . Per Verna Czech, PA, patient is in need of higher level of care due to fever post colonoscopy.  . Patient is aware and verbalizes understanding of plan of care. Patient plans to go to [facility] ED per clinical staff Vitals:   03/25/22 1856  BP: 110/72  Pulse: (!) 114  Resp: 20  Temp: (!) 100.6 F (38.1 C)  SpO2: 96%

## 2022-03-25 NOTE — ED Notes (Signed)
PIV established, blood specimens sent, 1 set of blood cultures sent.

## 2022-03-25 NOTE — ED Triage Notes (Signed)
Patient reports having a colonoscopy yesterday.    Last night, during the night started having chills.  Patient was having weakness and fatigue.  Took temperature and it was 103.    Patient has had tylenol.

## 2022-03-25 NOTE — Telephone Encounter (Signed)
  Follow up Call-    Row Labels 03/24/2022    8:05 AM  Call back number   Section Header. No data exists in this row.   Post procedure Call Back phone  #   [phone]  Permission to leave phone message   Yes     Patient questions:  Do you have a fever, pain , or abdominal swelling? No. Pain Score  0 *  Have you tolerated food without any problems? Yes.    Have you been able to return to your normal activities? Yes.    Do you have any questions about your discharge instructions: Diet   Yes.   Medications  Yes.   Follow up visit  Yes.    Do you have questions or concerns about your Care? Yes.    Actions: * If pain score is 4 or above: No action needed, pain <4.    Patient went home and experienced chills and a temp spike of 103 degrees.  Later in the evening it dropped to 100 degrees. This morning it is 100 degrees and feeling essentially normal.  No other symptoms.  Advised patient to call us back if his temperature goes up again or if he experiences any other symptoms.

## 2022-03-25 NOTE — Telephone Encounter (Signed)
Spoke with pt, states his temp has went back up to 101. States he thought he was feeling better but now with fever, chills and lethargy again. Reviewed Dr. Doyne Keel response with pt from call this morning. Instructed pt that he may have the flu or Covid. States he took a Museum/gallery curator yesterday and it was negative but he might take another one later. Instructed pt that if he does not hear back then no new recommendations from MD. Instructed pt to seek care at Urgent Care or PCP for symptoms/testing. Pt verbalized understanding.

## 2022-03-25 NOTE — Telephone Encounter (Signed)
Thanks for letting me know.  If he has no pain or issues with his bowels otherwise, I think the fever would be unlikely related to his colonoscopy.  Hopefully he continues to feel better but yes if fever comes back he should contact us.  Thanks

## 2022-03-25 NOTE — Discharge Instructions (Signed)
I am concerned that you have a fever and high heart rate after just having had a colonoscopy.  Please go to the emergency room for further evaluation and management as we discussed.

## 2022-03-25 NOTE — ED Provider Notes (Signed)
[facility] Provider Note   CSN: [id number] Arrival date & time: 03/25/22  2038     History Chief Complaint  Patient presents with   Fever    Casey [name] is a 57 y.o. male.  Patient presents to the emergency department complaints of fever.  Patient had a colonoscopy performed yesterday.  Patient reports associated mild nausea, weakness and generalized fatigue.  Patient does report that he had 1 episode of vomiting prior to arrival to the emergency department. Otherwise denies dysuria, hematuria, chest pain, abdominal pain, diarrhea, or headaches.    Fever Associated symptoms: no chest pain, no cough, no dysuria and no headaches        Home Medications Prior to Admission medications   Medication Sig Start Date End Date Taking? Authorizing Provider  cephALEXin (KEFLEX) 500 MG capsule Take 2 capsules (1,000 mg total) by mouth 2 (two) times daily for 10 days. 03/26/22 04/05/22 Yes Lourdes Sledge A, PA-C  ondansetron (ZOFRAN-ODT) 4 MG disintegrating tablet Take 1 tablet (4 mg total) by mouth every 8 (eight) hours as needed for nausea or vomiting. 03/26/22  Yes Luvenia Heller, PA-C  ALPRAZolam (XANAX) 0.5 MG tablet Take 1 tablet (0.5 mg total) by mouth 3 (three) times daily as needed for anxiety. 01/09/22   Janith Lima, MD  augmented betamethasone dipropionate (DIPROLENE-AF) 0.05 % cream Apply to affected area twice daily as needed. 01/01/22     metFORMIN (GLUCOPHAGE-XR) 750 MG 24 hr tablet Take 1 tablet (750 mg total) by mouth daily with breakfast. 11/19/21   Janith Lima, MD  olmesartan (BENICAR) 20 MG tablet Take 1 tablet (20 mg total) by mouth daily. 02/18/22   Janith Lima, MD  rosuvastatin (CRESTOR) 5 MG tablet Take 1 tablet (5 mg total) by mouth daily. 11/19/21   Janith Lima, MD      Allergies    Lisinopril    Review of Systems   Review of Systems  Constitutional:  Positive for fever.  Respiratory:  Negative for cough, chest  tightness and shortness of breath.   Cardiovascular:  Negative for chest pain.  Genitourinary:  Negative for dysuria, flank pain and hematuria.  Neurological:  Negative for weakness and headaches.  All other systems reviewed and are negative.   Physical Exam Updated Vital Signs BP 104/66   Pulse (!) 111   Temp (!) 101 F (38.3 C)   Resp 18   Ht '6\' 1"'$  (1.854 m)   Wt 112.9 kg   SpO2 98%   BMI 32.84 kg/m  Physical Exam Vitals and nursing note reviewed.  Constitutional:      General: He is not in acute distress.    Appearance: Normal appearance. He is not toxic-appearing.  HENT:     Head: Normocephalic and atraumatic.  Eyes:     Conjunctiva/sclera: Conjunctivae normal.  Cardiovascular:     Rate and Rhythm: Normal rate and regular rhythm.     Pulses: Normal pulses.     Heart sounds: Normal heart sounds.  Pulmonary:     Effort: Pulmonary effort is normal.     Breath sounds: Normal breath sounds.  Abdominal:     General: Abdomen is flat. Bowel sounds are normal.     Palpations: There is no mass.     Tenderness: There is no abdominal tenderness. There is no right CVA tenderness or left CVA tenderness.  Skin:    General: Skin is warm and dry.     Capillary  Refill: Capillary refill takes less than 2 seconds.     Findings: No erythema, lesion or rash.  Neurological:     Mental Status: He is alert.     ED Results / Procedures / Treatments   Labs (all labs ordered are listed, but only abnormal results are displayed) Labs Reviewed  CBC WITH DIFFERENTIAL/PLATELET - Abnormal; Notable for the following components:      Result Value   MCH 34.8 (*)    MCHC 36.1 (*)    Platelets 141 (*)    Neutro Abs 8.4 (*)    All other components within normal limits  COMPREHENSIVE METABOLIC PANEL - Abnormal; Notable for the following components:   Sodium 134 (*)    Glucose, Bld 144 (*)    Total Bilirubin 2.3 (*)    All other components within normal limits  URINALYSIS, ROUTINE W REFLEX  MICROSCOPIC - Abnormal; Notable for the following components:   Specific Gravity, Urine >1.046 (*)    Ketones, ur 15 (*)    Protein, ur 30 (*)    All other components within normal limits  RESP PANEL BY RT-PCR (RSV, FLU A&B, COVID)  RVPGX2  CULTURE, BLOOD (ROUTINE X 2)  CULTURE, BLOOD (ROUTINE X 2)  LACTIC ACID, PLASMA    EKG None  Radiology CT ABDOMEN PELVIS W CONTRAST  Result Date: 03/25/2022 CLINICAL DATA:  Abdominal pain, postop. Colonoscopy yesterday with fever and chills. EXAM: CT ABDOMEN AND PELVIS WITH CONTRAST TECHNIQUE: Multidetector CT imaging of the abdomen and pelvis was performed using the standard protocol following bolus administration of intravenous contrast. RADIATION DOSE REDUCTION: This exam was performed according to the departmental dose-optimization program which includes automated exposure control, adjustment of the mA and/or kV according to patient size and/or use of iterative reconstruction technique. CONTRAST:  185m OMNIPAQUE IOHEXOL 300 MG/ML  SOLN COMPARISON:  03/26/2017. FINDINGS: Lower chest: No acute abnormality. Hepatobiliary: No focal liver abnormality is seen. There is fatty infiltration of the liver. No biliary ductal dilatation. Stones are present within the gallbladder. Pancreas: Unremarkable. No pancreatic ductal dilatation or surrounding inflammatory changes. Spleen: Normal size. A few scattered calcified granuloma are present. Adrenals/Urinary Tract: The adrenal glands are within normal limits. There is mild patchy hypoenhancement of the left kidney with perinephric fat stranding extending into the retroperitoneum inferiorly. Cysts are present in the left kidney. Additional subcentimeter hypodensities are noted bilaterally which are too small to further characterize. There are bilateral renal calculi. No renal or ureteral calculus or obstructive uropathy bilaterally. The bladder is unremarkable. Stomach/Bowel: Stomach is within normal limits. Appendix is  surgically absent. No evidence of bowel wall thickening, distention, or inflammatory changes. No free air or pneumatosis. Scattered diverticula are present along the sigmoid colon without evidence of diverticulitis. Vascular/Lymphatic: Aortic atherosclerosis. No enlarged abdominal or pelvic lymph nodes. Reproductive: Prostate is unremarkable. Other: No abdominopelvic ascites. Small fat containing umbilical hernia. Musculoskeletal: Degenerative changes in the thoracolumbar spine. No acute osseous abnormality. IMPRESSION: 1. Patchy hypoenhancement of the left kidney with perinephric fat stranding, concerning for pyelonephritis. No ureteral calculus or obstructive uropathy bilaterally. 2. Bilateral nephrolithiasis and left renal cysts. 3. Hepatic steatosis. 4. Cholelithiasis.  Sigmoid diverticulosis without diverticulitis. 5. Aortic atherosclerosis. Electronically Signed   By: LBrett FairyM.D.   On: 03/25/2022 23:03    Procedures Procedures   Medications Ordered in ED Medications  ondansetron ([facility] injection 4 mg (4 mg Intravenous Given 03/25/22 2237)  iohexol (OMNIPAQUE) 300 MG/ML solution 100 mL (100 mLs Intravenous Contrast Given 03/25/22 2243)  ibuprofen (ADVIL) tablet 800 mg (800 mg Oral Given 03/25/22 2345)  piperacillin-tazobactam (ZOSYN) IVPB 3.375 g (3.375 g Intravenous New Bag/Given 03/26/22 0016)    ED Course/ Medical Decision Making/ A&P Clinical Course as of 03/26/22 0055  Wed Mar 25, 2022  2348 Urinalysis, Routine w reflex microscopic -Urine, Clean Catch [OZ]    Clinical Course User Index [OZ] Luvenia Heller, PA-C                           Medical Decision Making Amount and/or Complexity of Data Reviewed Labs: ordered. Decision-making details documented in ED Course. Radiology: ordered.  Risk Prescription drug management.   This patient presents to the ED for concern of fever, this involves an extensive number of treatment options, and is a complaint that carries with it a  high risk of complications and morbidity.  The differential diagnosis includes bowel perforation, urinary tract infection, gastroenteritis,    Co morbidities that complicate the patient evaluation  Type 2 diabetes   Lab Tests:  I Ordered, and personally interpreted labs.  The pertinent results include:  CBC and CMP largely normal, but UA possible showing signs of infection with elevated WBC at 6-10, but no nitrites, leukocytes or bacteria seen   Imaging Studies ordered:  I ordered imaging studies including CT abdomen pelvis I independently visualized and interpreted imaging which showed possible pyelonephritis I agree with the radiologist interpretation   Consultations Obtained:  I requested consultation with the Dr. Bridgett Larsson,  and discussed lab and imaging findings as well as pertinent plan - they recommend: outpatient antibiotics with 3rd gen cephalosporin and strong rehydration.  I do disagree somewhat with Dr. Lianne Moris recommendation against observation, but do agree that patient does clinically appear to not be ill beyond ability to manage his symptoms outpatient with fluids and antibiotics. Strong return precautions will be provided to patient.   Problem List / ED Course / Critical interventions / Medication management  Patient presented to ED concerned for fever. Patient had colonoscopy performed yesterday without obvious complications, but reports that he began to feel off soon after procedures. Patient began having fever with one episode of vomiting earlier today that did not resolve his symptoms. Patient presented after being seen at urgent care in which they were concerned about possible SIRS criteria as he was febrile and tachycardic there. Patient's clinical picture appears the same here in the ED but fevers have been responding well to antipyretic medication. Patient's physical exam is otherwise reassuring without obvious signs of infection noted. CT of abdomen was concerning for  possible pyelonephritis but given UA is largely normal, unsure if this is causing patient's symptoms in the setting of no obvious urinary symptoms or CVA tenderness.  Advised patient of conversation with Dr. Vallarie Mare who recommended against inpatient admission or observation given that patient did not seem to meet criteria for admission.  Will send prescriptions for antibiotics and nausea medication to patient's pharmacy to take over the next few days.  Discussed with patient the importance of adequate hydration to ensure that patient's dehydration noted on urine is addressed and to prevent any possible sequela.  Patient was agreeable with plan to discharge home as he does not think that he is sick enough at this point to have to require a hospital stay.  Advised patient to return back to the emergency department he has fevers which are not responding to antipyretic medications, intractable vomiting, or begins to experience severe abdominal pain  or severe and worsening urinary symptoms.  Patient's questions answered prior to discharge. I ordered medication including Zofran, ibuprofen for nausea, fever  Reevaluation of the patient after these medicines showed that the patient improved I have reviewed the patients home medicines and have made adjustments as needed  Final Clinical Impression(s) / ED Diagnoses Final diagnoses:  SIRS (systemic inflammatory response syndrome) ([place])  Pyelonephritis    Rx / DC Orders ED Discharge Orders          Ordered    cephALEXin (KEFLEX) 500 MG capsule  2 times daily        03/26/22 0050    ondansetron (ZOFRAN-ODT) 4 MG disintegrating tablet  Every 8 hours PRN        03/26/22 0050              Luvenia Heller, PA-C 03/26/22 0055    Regan Lemming, MD 03/28/22 1545

## 2022-03-25 NOTE — Telephone Encounter (Signed)
Sorry to hear this. If no pain or blood per rectum, etc, I think unlikely related to his procedure. He should contact his PCP, perhaps he has a viral infection but needs to be evaluated by PCP or urgent care. Thanks

## 2022-03-25 NOTE — ED Provider Notes (Signed)
[facility]    CSN: [id number] Arrival date & time: 03/25/22  1800      History   Chief Complaint No chief complaint on file.   HPI Casey [name] is a 57 y.o. male.   Patient presents today with a 24-hour history of fever.  Reports that his fever has gotten as high as 104 F.  He has felt delirious and poorly but denies any additional symptoms including cough, congestion, diarrhea.  He has had an episode of nausea and vomiting earlier today.  He has tried Tylenol and ibuprofen without improvement of symptoms.  He had a colonoscopy yesterday and reports that symptoms began several hours after this procedure.  He has had difficulty eating and drinking normally as he has no significant appetite.  He has not had a bowel movement but has been passing gas.  He is up-to-date on all of his vaccinations including influenza and COVID-19.  Denies any known sick contacts.    Past Medical History:  Diagnosis Date   Anxiety    Bladder tumor    Cancer ([place])    Diabetes mellitus without complication ([place])    does not check cbg regularly   Eczema    GERD (gastroesophageal reflux disease)    History of adenomatous polyp of colon    History of kidney stones    Hypertension    Nocturia    OSA (obstructive sleep apnea)    study in epic 08-02-2018;  followed by dr Rexene Alberts, does not use CPAP   RBBB (right bundle branch block)    Seasonal allergic rhinitis    Wears glasses     Patient Active Problem List   Diagnosis Date Noted   NASH (nonalcoholic steatohepatitis) 02/18/2022   Need for pneumococcal 20-valent conjugate vaccination 02/18/2022   GAD (generalized anxiety disorder) 11/19/2021   Abnormal EKG 11/19/2021   Type II diabetes mellitus with manifestations ([place]) 11/19/2021   Hyperlipidemia LDL goal <70 11/19/2021   OSA (obstructive sleep apnea)    Encounter for general adult medical examination with abnormal findings 07/18/2020   Primary hypertension 07/18/2020    Patellofemoral pain syndrome of both knees 09/19/2018   Eczema 04/06/2015    Past Surgical History:  Procedure Laterality Date   COLONOSCOPY WITH PROPOFOL  02/10/2017   EXTRACORPOREAL SHOCK WAVE LITHOTRIPSY Left 01/27/2018   Procedure: EXTRACORPOREAL SHOCK WAVE LITHOTRIPSY (ESWL);  Surgeon: Bjorn Loser, MD;  Location: [facility];  Service: Urology;  Laterality: Left;   LAPAROSCOPIC APPENDECTOMY  08/16/2003   '@[facility]'$    TRANSURETHRAL RESECTION OF BLADDER TUMOR  2020   TRANSURETHRAL RESECTION OF BLADDER TUMOR WITH MITOMYCIN-C Bilateral 09/27/2020   Procedure: TRANSURETHRAL RESECTION OF BLADDER TUMOR WITH GEMCITABINE BILATERAL RETROGRADE PYELOGRAM;  Surgeon: Lucas Mallow, MD;  Location: [facility];  Service: Urology;  Laterality: Bilateral;   TRANSURETHRAL RESECTION OF BLADDER TUMOR WITH MITOMYCIN-C Bilateral 12/15/2021   Procedure: TRANSURETHRAL RESECTION OF BLADDER TUMOR BILATERAL RETROGRADE PYELOGRAM WITH GEMCITABINE;  Surgeon: Lucas Mallow, MD;  Location[facility]  Service: Urology;  Laterality: Bilateral;  1 HR FOR CASE   URETEROSCOPY WITH HOLMIUM LASER LITHOTRIPSY  2020   WISDOM TOOTH EXTRACTION     age 54       Home Medications    Prior to Admission medications   Medication Sig Start Date End Date Taking? Authorizing Provider  ALPRAZolam Duanne Moron) 0.5 MG tablet Take 1 tablet (0.5 mg total) by mouth 3 (three) times daily as needed for anxiety. 01/09/22  Janith Lima, MD  augmented betamethasone dipropionate (DIPROLENE-AF) 0.05 % cream Apply to affected area twice daily as needed. 01/01/22     metFORMIN (GLUCOPHAGE-XR) 750 MG 24 hr tablet Take 1 tablet (750 mg total) by mouth daily with breakfast. 11/19/21   Janith Lima, MD  olmesartan (BENICAR) 20 MG tablet Take 1 tablet (20 mg total) by mouth daily. 02/18/22   Janith Lima, MD  rosuvastatin (CRESTOR) 5 MG tablet Take 1 tablet (5 mg total) by mouth daily. 11/19/21   Janith Lima,  MD    Family History Family History  Problem Relation Age of Onset   CVA Mother    Allergic rhinitis Mother    Stroke Mother    Alcohol abuse Mother    Diabetes Mother    COPD Mother    Asthma Father        childhood   Hypertension Father    Bladder Cancer Father    Alcohol abuse Father    CVA Father    Lupus Maternal Aunt    COPD Maternal Grandmother    Colon cancer Neg Hx    Eczema Neg Hx    Urticaria Neg Hx    Angioedema Neg Hx    Colon polyps Neg Hx    Esophageal cancer Neg Hx    Prostate cancer Neg Hx    Rectal cancer Neg Hx    Stomach cancer Neg Hx     Social History Social History   Tobacco Use   Smoking status: Former    Packs/day: 1.75    Years: 35.00    Total pack years: 61.25    Types: Cigarettes    Quit date: 11/24/2015    Years since quitting: 6.3   Smokeless tobacco: Never  Vaping Use   Vaping Use: Never used  Substance Use Topics   Alcohol use: Yes    Comment: rare    Drug use: No     Allergies   Lisinopril   Review of Systems Review of Systems  Constitutional:  Positive for activity change, fatigue and fever. Negative for appetite change.  Respiratory:  Negative for cough and shortness of breath.   Cardiovascular:  Negative for chest pain.  Gastrointestinal:  Negative for abdominal pain, diarrhea, nausea and vomiting.     Physical Exam Triage Vital Signs ED Triage Vitals  Enc Vitals Group     BP 03/25/22 1856 110/72     Pulse Rate 03/25/22 1856 (!) 114     Resp 03/25/22 1856 20     Temp 03/25/22 1856 (!) 100.6 F (38.1 C)     Temp Source 03/25/22 1856 Oral     SpO2 03/25/22 1856 96 %     Weight --      Height --      Head Circumference --      Peak Flow --      Pain Score 03/25/22 1854 5     Pain Loc --      Pain Edu? --      Excl. in [place]? --    No data found.  Updated Vital Signs BP 110/72 (BP Location: Left Arm)   Pulse (!) 114   Temp (!) 100.6 F (38.1 C) (Oral)   Resp 20   SpO2 96%   Visual Acuity Right  Eye Distance:   Left Eye Distance:   Bilateral Distance:    Right Eye Near:   Left Eye Near:    Bilateral Near:  Physical Exam Vitals reviewed.  Constitutional:      General: He is awake.     Appearance: Normal appearance. He is well-developed. He is not ill-appearing.     Comments: Very pleasant male appears stated age in no acute distress sitting comfortably in exam room  HENT:     Head: Normocephalic and atraumatic.     Mouth/Throat:     Pharynx: Uvula midline. No oropharyngeal exudate or posterior oropharyngeal erythema.  Cardiovascular:     Rate and Rhythm: Normal rate and regular rhythm.     Heart sounds: Normal heart sounds, S1 normal and S2 normal. No murmur heard. Pulmonary:     Effort: Pulmonary effort is normal.     Breath sounds: Normal breath sounds. No stridor. No wheezing, rhonchi or rales.     Comments: Clear to auscultation bilaterally Abdominal:     General: Bowel sounds are normal.     Palpations: Abdomen is soft.     Tenderness: There is generalized abdominal tenderness. There is no right CVA tenderness, left CVA tenderness, guarding or rebound.     Comments: Very mild tenderness to palpation particularly in upper abdomen.  No evidence of acute abdomen on physical exam.  Neurological:     Mental Status: He is alert.  Psychiatric:        Behavior: Behavior is cooperative.      UC Treatments / Results  Labs (all labs ordered are listed, but only abnormal results are displayed) Labs Reviewed - No data to display  EKG   Radiology No results found.  Procedures Procedures (including critical care time)  Medications Ordered in UC Medications - No data to display  Initial Impression / Assessment and Plan / UC Course  I have reviewed the triage vital signs and the nursing notes.  Pertinent labs & imaging results that were available during my care of the patient were reviewed by me and considered in my medical decision making (see chart for  details).     Patient is febrile and tachycardic in clinic meeting SIRS criteria.  Discussed that given his recent abdominal procedure I recommend he go to the emergency room where they have access to CT/abdominal imaging for further evaluation and management since we do not have these resources and any workup initiated by our office would take several days.  Patient is agreeable and will go to [facility], ER for further evaluation and management.  Patient was stable at time of discharge.  Final Clinical Impressions(s) / UC Diagnoses   Final diagnoses:  SIRS (systemic inflammatory response syndrome) (HCC)  Fever, unspecified  S/P colonoscopy     Discharge Instructions      I am concerned that you have a fever and high heart rate after just having had a colonoscopy.  Please go to the emergency room for further evaluation and management as we discussed.    ED Prescriptions   None    PDMP not reviewed this encounter.   Terrilee Croak, PA-C 03/25/22 1927

## 2022-03-26 MED ORDER — ONDANSETRON 4 MG PO TBDP: 4.0000 mg | ORAL_TABLET | Freq: Three times a day (TID) | ORAL | 0 refills | Status: DC | PRN

## 2022-03-26 MED ORDER — CEPHALEXIN 500 MG PO CAPS: 1000.0000 mg | ORAL_CAPSULE | Freq: Two times a day (BID) | ORAL | 0 refills | Status: AC

## 2022-03-26 MED ORDER — PIPERACILLIN-TAZOBACTAM 3.375 G IVPB 30 MIN
3.3750 g | Freq: Once | INTRAVENOUS | Status: AC
  Administered 2022-03-26: 3.375 g via INTRAVENOUS
  Filled 2022-03-26: qty 50

## 2022-03-26 NOTE — ED Provider Notes (Incomplete)
[facility] Provider Note   CSN: [id number] Arrival date & time: 03/25/22  2038     History Chief Complaint  Patient presents with  . Fever    Casey [name] is a 57 y.o. male.  Patient presents to the emergency department complaints of fever.  Patient had a colonoscopy performed yesterday.  Patient reports associated mild nausea, weakness and generalized fatigue.  Patient does report that he had 1 episode of vomiting prior to arrival to the emergency department.    Fever Associated symptoms: no chest pain, no cough, no dysuria and no headaches        Home Medications Prior to Admission medications   Medication Sig Start Date End Date Taking? Authorizing Provider  ALPRAZolam Duanne Moron) 0.5 MG tablet Take 1 tablet (0.5 mg total) by mouth 3 (three) times daily as needed for anxiety. 01/09/22   Janith Lima, MD  augmented betamethasone dipropionate (DIPROLENE-AF) 0.05 % cream Apply to affected area twice daily as needed. 01/01/22     metFORMIN (GLUCOPHAGE-XR) 750 MG 24 hr tablet Take 1 tablet (750 mg total) by mouth daily with breakfast. 11/19/21   Janith Lima, MD  olmesartan (BENICAR) 20 MG tablet Take 1 tablet (20 mg total) by mouth daily. 02/18/22   Janith Lima, MD  rosuvastatin (CRESTOR) 5 MG tablet Take 1 tablet (5 mg total) by mouth daily. 11/19/21   Janith Lima, MD      Allergies    Lisinopril    Review of Systems   Review of Systems  Constitutional:  Positive for fever.  Respiratory:  Negative for cough, chest tightness and shortness of breath.   Cardiovascular:  Negative for chest pain.  Genitourinary:  Negative for dysuria, flank pain and hematuria.  Neurological:  Negative for weakness and headaches.  All other systems reviewed and are negative.   Physical Exam Updated Vital Signs BP 120/73   Pulse (!) 108   Temp (!) 101 F (38.3 C)   Resp 18   Ht '6\' 1"'$  (1.854 m)   Wt 112.9 kg   SpO2 97%   BMI 32.84  kg/m  Physical Exam Vitals and nursing note reviewed.  Constitutional:      General: He is not in acute distress.    Appearance: Normal appearance. He is not toxic-appearing.  HENT:     Head: Normocephalic and atraumatic.  Eyes:     Conjunctiva/sclera: Conjunctivae normal.  Cardiovascular:     Rate and Rhythm: Normal rate and regular rhythm.     Pulses: Normal pulses.     Heart sounds: Normal heart sounds.  Pulmonary:     Effort: Pulmonary effort is normal.     Breath sounds: Normal breath sounds.  Abdominal:     General: Abdomen is flat. Bowel sounds are normal.     Palpations: There is no mass.     Tenderness: There is no abdominal tenderness. There is no right CVA tenderness or left CVA tenderness.  Skin:    General: Skin is warm and dry.     Capillary Refill: Capillary refill takes less than 2 seconds.     Findings: No erythema, lesion or rash.  Neurological:     Mental Status: He is alert.     ED Results / Procedures / Treatments   Labs (all labs ordered are listed, but only abnormal results are displayed) Labs Reviewed  CBC WITH DIFFERENTIAL/PLATELET - Abnormal; Notable for the following components:  Result Value   MCH 34.8 (*)    MCHC 36.1 (*)    Platelets 141 (*)    Neutro Abs 8.4 (*)    All other components within normal limits  COMPREHENSIVE METABOLIC PANEL - Abnormal; Notable for the following components:   Sodium 134 (*)    Glucose, Bld 144 (*)    Total Bilirubin 2.3 (*)    All other components within normal limits  URINALYSIS, ROUTINE W REFLEX MICROSCOPIC - Abnormal; Notable for the following components:   Specific Gravity, Urine >1.046 (*)    Ketones, ur 15 (*)    Protein, ur 30 (*)    All other components within normal limits  RESP PANEL BY RT-PCR (RSV, FLU A&B, COVID)  RVPGX2  LACTIC ACID, PLASMA    EKG None  Radiology CT ABDOMEN PELVIS W CONTRAST  Result Date: 03/25/2022 CLINICAL DATA:  Abdominal pain, postop. Colonoscopy yesterday  with fever and chills. EXAM: CT ABDOMEN AND PELVIS WITH CONTRAST TECHNIQUE: Multidetector CT imaging of the abdomen and pelvis was performed using the standard protocol following bolus administration of intravenous contrast. RADIATION DOSE REDUCTION: This exam was performed according to the departmental dose-optimization program which includes automated exposure control, adjustment of the mA and/or kV according to patient size and/or use of iterative reconstruction technique. CONTRAST:  185m OMNIPAQUE IOHEXOL 300 MG/ML  SOLN COMPARISON:  03/26/2017. FINDINGS: Lower chest: No acute abnormality. Hepatobiliary: No focal liver abnormality is seen. There is fatty infiltration of the liver. No biliary ductal dilatation. Stones are present within the gallbladder. Pancreas: Unremarkable. No pancreatic ductal dilatation or surrounding inflammatory changes. Spleen: Normal size. A few scattered calcified granuloma are present. Adrenals/Urinary Tract: The adrenal glands are within normal limits. There is mild patchy hypoenhancement of the left kidney with perinephric fat stranding extending into the retroperitoneum inferiorly. Cysts are present in the left kidney. Additional subcentimeter hypodensities are noted bilaterally which are too small to further characterize. There are bilateral renal calculi. No renal or ureteral calculus or obstructive uropathy bilaterally. The bladder is unremarkable. Stomach/Bowel: Stomach is within normal limits. Appendix is surgically absent. No evidence of bowel wall thickening, distention, or inflammatory changes. No free air or pneumatosis. Scattered diverticula are present along the sigmoid colon without evidence of diverticulitis. Vascular/Lymphatic: Aortic atherosclerosis. No enlarged abdominal or pelvic lymph nodes. Reproductive: Prostate is unremarkable. Other: No abdominopelvic ascites. Small fat containing umbilical hernia. Musculoskeletal: Degenerative changes in the thoracolumbar  spine. No acute osseous abnormality. IMPRESSION: 1. Patchy hypoenhancement of the left kidney with perinephric fat stranding, concerning for pyelonephritis. No ureteral calculus or obstructive uropathy bilaterally. 2. Bilateral nephrolithiasis and left renal cysts. 3. Hepatic steatosis. 4. Cholelithiasis.  Sigmoid diverticulosis without diverticulitis. 5. Aortic atherosclerosis. Electronically Signed   By: LBrett FairyM.D.   On: 03/25/2022 23:03    Procedures Procedures   Medications Ordered in ED Medications  piperacillin-tazobactam (ZOSYN) IVPB 3.375 g (has no administration in time range)  ondansetron (ZOFRAN) injection 4 mg (4 mg Intravenous Given 03/25/22 2237)  iohexol (OMNIPAQUE) 300 MG/ML solution 100 mL (100 mLs Intravenous Contrast Given 03/25/22 2243)  ibuprofen (ADVIL) tablet 800 mg (800 mg Oral Given 03/25/22 2345)    ED Course/ Medical Decision Making/ A&P Clinical Course as of 03/26/22 0003  Wed Mar 25, 2022  2348 Urinalysis, Routine w reflex microscopic -Urine, Clean Catch [OZ]    Clinical Course User Index [OZ] ZLuvenia Heller PA-C   {   Click here for ABCD2, HEART and other calculatorsREFRESH Note before signing :  1}                          Medical Decision Making Amount and/or Complexity of Data Reviewed Labs: ordered. Decision-making details documented in ED Course. Radiology: ordered.  Risk Prescription drug management.   This patient presents to the ED for concern of ***, this involves an extensive number of treatment options, and is a complaint that carries with it a high risk of complications and morbidity.  The differential diagnosis includes ***   Co morbidities that complicate the patient evaluation  ***   Additional history obtained:  Additional history obtained from *** External records from outside source obtained and reviewed including ***   Lab Tests:  I Ordered, and personally interpreted labs.  The pertinent results include:   ***   Imaging Studies ordered:  I ordered imaging studies including ***  I independently visualized and interpreted imaging which showed *** I agree with the radiologist interpretation   Cardiac Monitoring: / EKG:  The patient was maintained on a cardiac monitor.  I personally viewed and interpreted the cardiac monitored which showed an underlying rhythm of: ***   Consultations Obtained:  I requested consultation with the ***,  and discussed lab and imaging findings as well as pertinent plan - they recommend: ***   Problem List / ED Course / Critical interventions / Medication management  *** I ordered medication including ***  for ***  Reevaluation of the patient after these medicines showed that the patient {resolved/improved/worsened:23923::"improved"} I have reviewed the patients home medicines and have made adjustments as needed   Social Determinants of Health:  ***   Test / Admission - Considered:  ***   Final Clinical Impression(s) / ED Diagnoses Final diagnoses:  None    Rx / DC Orders ED Discharge Orders     None

## 2022-03-26 NOTE — Discharge Instructions (Signed)
You were seen in the emergency department tonight and diagnosed with a kidney infection.  Patient results her CT scan appears that there is an infection in the left kidney although urine is rather reassuring.  After discussing with the hospitalist, the recommendation was that he should have strong rehydration at home as tolerated as well as outpatient antibiotics I have sent in a prescription for Keflex which she will take as prescribed over the course of the next 10 days.  I will also send some Zofran for nausea if needed.  You should plan to follow-up with her primary care provider in the next several days for follow-up to ensure that your symptoms are improving.  If you experience fevers which are not responding well to Tylenol or ibuprofen, experience severe vomiting that does not respond well to the Zofran, or begin to experience significant abdominal pain you should come back to the emergency department for further evaluation as this may indicate a spreading or worsening infection.

## 2022-03-31 LAB — CULTURE, BLOOD (ROUTINE X 2)
Culture: NO GROWTH
Culture: NO GROWTH
Special Requests: ADEQUATE

## 2022-04-10 DIAGNOSIS — N200 Calculus of kidney: Secondary | ICD-10-CM | POA: Diagnosis not present

## 2022-04-10 DIAGNOSIS — D414 Neoplasm of uncertain behavior of bladder: Secondary | ICD-10-CM | POA: Diagnosis not present

## 2022-04-10 DIAGNOSIS — N390 Urinary tract infection, site not specified: Secondary | ICD-10-CM | POA: Diagnosis not present

## 2022-04-15 ENCOUNTER — Other Ambulatory Visit (HOSPITAL_COMMUNITY): Payer: Self-pay

## 2022-04-15 MED ORDER — SULFAMETHOXAZOLE-TRIMETHOPRIM 800-160 MG PO TABS
1.0000 | ORAL_TABLET | Freq: Two times a day (BID) | ORAL | 0 refills | Status: DC
  Filled 2022-04-15: qty 14, 7d supply, fill #0

## 2022-05-11 ENCOUNTER — Other Ambulatory Visit (HOSPITAL_COMMUNITY): Payer: Self-pay

## 2022-05-11 ENCOUNTER — Other Ambulatory Visit: Payer: Self-pay | Admitting: Internal Medicine

## 2022-05-11 DIAGNOSIS — E118 Type 2 diabetes mellitus with unspecified complications: Secondary | ICD-10-CM

## 2022-05-11 DIAGNOSIS — E785 Hyperlipidemia, unspecified: Secondary | ICD-10-CM

## 2022-05-11 MED ORDER — METFORMIN HCL ER 750 MG PO TB24
750.0000 mg | ORAL_TABLET | Freq: Every day | ORAL | 0 refills | Status: DC
  Filled 2022-05-11: qty 90, 90d supply, fill #0

## 2022-05-11 MED ORDER — ROSUVASTATIN CALCIUM 5 MG PO TABS
5.0000 mg | ORAL_TABLET | Freq: Every day | ORAL | 0 refills | Status: DC
  Filled 2022-05-11: qty 90, 90d supply, fill #0

## 2022-05-13 ENCOUNTER — Other Ambulatory Visit: Payer: Self-pay | Admitting: Ophthalmology

## 2022-05-13 ENCOUNTER — Other Ambulatory Visit (HOSPITAL_COMMUNITY): Payer: Self-pay

## 2022-05-13 DIAGNOSIS — N3000 Acute cystitis without hematuria: Secondary | ICD-10-CM | POA: Diagnosis not present

## 2022-05-13 DIAGNOSIS — D414 Neoplasm of uncertain behavior of bladder: Secondary | ICD-10-CM | POA: Diagnosis not present

## 2022-05-13 DIAGNOSIS — N3020 Other chronic cystitis without hematuria: Secondary | ICD-10-CM | POA: Diagnosis not present

## 2022-05-13 MED ORDER — HYDROCODONE-ACETAMINOPHEN 5-325 MG PO TABS
1.0000 | ORAL_TABLET | Freq: Four times a day (QID) | ORAL | 0 refills | Status: DC | PRN
  Filled 2022-05-13: qty 10, 3d supply, fill #0

## 2022-05-20 DIAGNOSIS — N390 Urinary tract infection, site not specified: Secondary | ICD-10-CM | POA: Diagnosis not present

## 2022-05-20 DIAGNOSIS — B957 Other staphylococcus as the cause of diseases classified elsewhere: Secondary | ICD-10-CM | POA: Diagnosis not present

## 2022-05-20 DIAGNOSIS — D414 Neoplasm of uncertain behavior of bladder: Secondary | ICD-10-CM | POA: Diagnosis not present

## 2022-05-28 ENCOUNTER — Other Ambulatory Visit (HOSPITAL_COMMUNITY): Payer: Self-pay

## 2022-05-28 MED ORDER — SULFAMETHOXAZOLE-TRIMETHOPRIM 800-160 MG PO TABS
1.0000 | ORAL_TABLET | Freq: Two times a day (BID) | ORAL | 0 refills | Status: DC
  Filled 2022-05-28: qty 14, 7d supply, fill #0

## 2022-06-12 DIAGNOSIS — Z5111 Encounter for antineoplastic chemotherapy: Secondary | ICD-10-CM | POA: Diagnosis not present

## 2022-06-12 DIAGNOSIS — D414 Neoplasm of uncertain behavior of bladder: Secondary | ICD-10-CM | POA: Diagnosis not present

## 2022-06-19 DIAGNOSIS — Z5111 Encounter for antineoplastic chemotherapy: Secondary | ICD-10-CM | POA: Diagnosis not present

## 2022-06-19 DIAGNOSIS — C672 Malignant neoplasm of lateral wall of bladder: Secondary | ICD-10-CM | POA: Diagnosis not present

## 2022-06-26 DIAGNOSIS — D414 Neoplasm of uncertain behavior of bladder: Secondary | ICD-10-CM | POA: Diagnosis not present

## 2022-06-26 DIAGNOSIS — Z5111 Encounter for antineoplastic chemotherapy: Secondary | ICD-10-CM | POA: Diagnosis not present

## 2022-07-06 ENCOUNTER — Ambulatory Visit
Admission: RE | Admit: 2022-07-06 | Discharge: 2022-07-06 | Disposition: A | Discharge status: Home / Self Care | Payer: BC Managed Care – PPO | Source: Ambulatory Visit | Attending: Internal Medicine | Admitting: Internal Medicine

## 2022-07-06 DIAGNOSIS — I700 Atherosclerosis of aorta: Secondary | ICD-10-CM | POA: Diagnosis not present

## 2022-07-06 DIAGNOSIS — J439 Emphysema, unspecified: Secondary | ICD-10-CM | POA: Diagnosis not present

## 2022-07-06 DIAGNOSIS — Z122 Encounter for screening for malignant neoplasm of respiratory organs: Secondary | ICD-10-CM

## 2022-07-06 DIAGNOSIS — Z87891 Personal history of nicotine dependence: Secondary | ICD-10-CM

## 2022-07-06 DIAGNOSIS — J9809 Other diseases of bronchus, not elsewhere classified: Secondary | ICD-10-CM | POA: Diagnosis not present

## 2022-07-15 ENCOUNTER — Other Ambulatory Visit: Payer: Self-pay | Admitting: Acute Care

## 2022-07-15 DIAGNOSIS — Z87891 Personal history of nicotine dependence: Secondary | ICD-10-CM

## 2022-07-15 DIAGNOSIS — Z122 Encounter for screening for malignant neoplasm of respiratory organs: Secondary | ICD-10-CM

## 2022-08-16 ENCOUNTER — Other Ambulatory Visit: Payer: Self-pay | Admitting: Internal Medicine

## 2022-08-16 DIAGNOSIS — E785 Hyperlipidemia, unspecified: Secondary | ICD-10-CM

## 2022-08-16 DIAGNOSIS — I10 Essential (primary) hypertension: Secondary | ICD-10-CM

## 2022-08-16 DIAGNOSIS — E118 Type 2 diabetes mellitus with unspecified complications: Secondary | ICD-10-CM

## 2022-08-17 ENCOUNTER — Other Ambulatory Visit (HOSPITAL_COMMUNITY): Payer: Self-pay

## 2022-08-18 ENCOUNTER — Other Ambulatory Visit (HOSPITAL_COMMUNITY): Payer: Self-pay

## 2022-08-18 ENCOUNTER — Other Ambulatory Visit: Payer: Self-pay | Admitting: Internal Medicine

## 2022-08-18 DIAGNOSIS — E785 Hyperlipidemia, unspecified: Secondary | ICD-10-CM

## 2022-08-18 DIAGNOSIS — I10 Essential (primary) hypertension: Secondary | ICD-10-CM

## 2022-08-18 DIAGNOSIS — E118 Type 2 diabetes mellitus with unspecified complications: Secondary | ICD-10-CM

## 2022-08-18 MED ORDER — METFORMIN HCL ER 750 MG PO TB24
750.0000 mg | ORAL_TABLET | Freq: Every day | ORAL | 0 refills | Status: DC
  Filled 2022-08-18: qty 90, 90d supply, fill #0

## 2022-08-18 MED ORDER — OLMESARTAN MEDOXOMIL 20 MG PO TABS
20.0000 mg | ORAL_TABLET | Freq: Every day | ORAL | 0 refills | Status: DC
  Filled 2022-08-18: qty 90, 90d supply, fill #0

## 2022-08-18 MED ORDER — ROSUVASTATIN CALCIUM 5 MG PO TABS
5.0000 mg | ORAL_TABLET | Freq: Every day | ORAL | 0 refills | Status: DC
  Filled 2022-08-18: qty 90, 90d supply, fill #0

## 2022-08-20 ENCOUNTER — Other Ambulatory Visit (HOSPITAL_COMMUNITY): Payer: Self-pay

## 2022-08-20 ENCOUNTER — Ambulatory Visit: Payer: BC Managed Care – PPO | Admitting: Internal Medicine

## 2022-08-20 ENCOUNTER — Encounter: Payer: Self-pay | Admitting: Internal Medicine

## 2022-08-20 VITALS — BP 112/70 | HR 81 | Temp 97.9°F | Ht 73.0 in | Wt 239.0 lb

## 2022-08-20 DIAGNOSIS — I950 Idiopathic hypotension: Secondary | ICD-10-CM | POA: Diagnosis not present

## 2022-08-20 DIAGNOSIS — E118 Type 2 diabetes mellitus with unspecified complications: Secondary | ICD-10-CM | POA: Diagnosis not present

## 2022-08-20 DIAGNOSIS — E519 Thiamine deficiency, unspecified: Secondary | ICD-10-CM

## 2022-08-20 DIAGNOSIS — D539 Nutritional anemia, unspecified: Secondary | ICD-10-CM | POA: Diagnosis not present

## 2022-08-20 DIAGNOSIS — E785 Hyperlipidemia, unspecified: Secondary | ICD-10-CM

## 2022-08-20 DIAGNOSIS — I10 Essential (primary) hypertension: Secondary | ICD-10-CM

## 2022-08-20 DIAGNOSIS — D538 Other specified nutritional anemias: Secondary | ICD-10-CM

## 2022-08-20 LAB — HEPATIC FUNCTION PANEL
ALT: 37 U/L (ref 0–53)
AST: 30 U/L (ref 0–37)
Albumin: 3.9 g/dL (ref 3.5–5.2)
Alkaline Phosphatase: 52 U/L (ref 39–117)
Bilirubin, Direct: 0.3 mg/dL (ref 0.0–0.3)
Total Bilirubin: 1.2 mg/dL (ref 0.2–1.2)
Total Protein: 7 g/dL (ref 6.0–8.3)

## 2022-08-20 LAB — BASIC METABOLIC PANEL
BUN: 12 mg/dL (ref 6–23)
CO2: 26 mEq/L (ref 19–32)
Calcium: 9.4 mg/dL (ref 8.4–10.5)
Chloride: 106 mEq/L (ref 96–112)
Creatinine, Ser: 0.82 mg/dL (ref 0.40–1.50)
GFR: 97.75 mL/min (ref >60.00)
Glucose, Bld: 84 mg/dL (ref 70–99)
Potassium: 4 mEq/L (ref 3.5–5.1)
Sodium: 139 mEq/L (ref 135–145)

## 2022-08-20 LAB — CBC WITH DIFFERENTIAL/PLATELET
Basophils Absolute: 0.1 10*3/uL (ref 0.0–0.1)
Basophils Relative: 1.6 % (ref 0.0–3.0)
Eosinophils Absolute: 0.1 10*3/uL (ref 0.0–0.7)
Eosinophils Relative: 2.7 % (ref 0.0–5.0)
HCT: 38.6 % — ABNORMAL LOW (ref 39.0–52.0)
Hemoglobin: 13.5 g/dL (ref 13.0–17.0)
Lymphocytes Relative: 33.3 % (ref 12.0–46.0)
Lymphs Abs: 1.3 10*3/uL (ref 0.7–4.0)
MCHC: 34.9 g/dL (ref 30.0–36.0)
MCV: 100.1 fl — ABNORMAL HIGH (ref 78.0–100.0)
Monocytes Absolute: 0.4 10*3/uL (ref 0.1–1.0)
Monocytes Relative: 9.5 % (ref 3.0–12.0)
Neutro Abs: 2.1 10*3/uL (ref 1.4–7.7)
Neutrophils Relative %: 52.9 % (ref 43.0–77.0)
Platelets: 208 10*3/uL (ref 150.0–400.0)
RBC: 3.86 Mil/uL — ABNORMAL LOW (ref 4.22–5.81)
RDW: 13.2 % (ref 11.5–15.5)
WBC: 4 10*3/uL (ref 4.0–10.5)

## 2022-08-20 LAB — LIPID PANEL
Cholesterol: 77 mg/dL (ref 0–200)
HDL: 17.6 mg/dL — ABNORMAL LOW (ref >39.00)
LDL Cholesterol: 38 mg/dL (ref 0–99)
NonHDL: 59.36
Total CHOL/HDL Ratio: 4
Triglycerides: 107 mg/dL (ref 0.0–149.0)
VLDL: 21.4 mg/dL (ref 0.0–40.0)

## 2022-08-20 LAB — CORTISOL: Cortisol, Plasma: 8.9 ug/dL

## 2022-08-20 LAB — HEMOGLOBIN A1C: Hgb A1c MFr Bld: 6 % (ref 4.6–6.5)

## 2022-08-20 NOTE — Progress Notes (Unsigned)
Subjective:  Patient ID: Casey [name], male    DOB: 1965-07-27  Age: 57 y.o. MRN: [id number]  CC: Hyperlipidemia, Diabetes, and Hypertension   HPI Casey [name] presents for f/up -  Discussed the use of AI scribe software for clinical note transcription with the patient, who gave verbal consent to proceed.  History of Present Illness   The patient, under surveillance for bladder cancer, underwent a resection and has been receiving BCG therapy since November of the previous year. They report two significant health events this year, unrelated to their cancer.  The first event was a kidney infection that led to an emergency room visit. The patient experienced severe symptoms that lasted for three days, which was unusual for them as they typically recover from illnesses within eight hours.  The second event occurred recently, over a weekend. The patient experienced abdominal bloating and vomiting, which started around noon on a Friday and lasted until midnight. The vomitus was described as green liquid with particles, suggestive of bile. Following this, the patient ran a fever ranging from 100 to 101 degrees Fahrenheit, which lasted for three days. This was also unusual for the patient, as they typically do not experience prolonged illnesses.  In the week leading up to this consultation, the patient also reported a cough that has been coming and going. They have tested negative for COVID-19 multiple times. The patient denies any urinary symptoms, blood in stool, shortness of breath, or chest pain.  The patient also mentioned potential sources of food poisoning, including possibly spoiled cream in coffee, a stromboli left in their car for a few hours, and a recalled ice cream brand consumed five days prior to the onset of symptoms. The patient's last eye exam was in February of the previous year, with a follow-up scheduled for later this year.       Outpatient Medications Prior to Visit   Medication Sig Dispense Refill   ALPRAZolam (XANAX) 0.5 MG tablet Take 1 tablet (0.5 mg total) by mouth 3 (three) times daily as needed for anxiety. 90 tablet 2   augmented betamethasone dipropionate (DIPROLENE-AF) 0.05 % cream Apply to affected area twice daily as needed. 50 g 1   metFORMIN (GLUCOPHAGE-XR) 750 MG 24 hr tablet Take 1 tablet (750 mg total) by mouth daily with breakfast. 90 tablet 0   ondansetron (ZOFRAN-ODT) 4 MG disintegrating tablet Take 1 tablet (4 mg total) by mouth every 8 (eight) hours as needed for nausea or vomiting. 20 tablet 0   rosuvastatin (CRESTOR) 5 MG tablet Take 1 tablet (5 mg total) by mouth daily. 90 tablet 0   olmesartan (BENICAR) 20 MG tablet Take 1 tablet (20 mg total) by mouth daily. 90 tablet 0   HYDROcodone-acetaminophen (NORCO/VICODIN) 5-325 MG tablet Take 1 tablet by mouth every 6 (six) hours as needed for pain 10 tablet 0   No facility-administered medications prior to visit.    ROS Review of Systems  Objective:  BP 112/70 (BP Location: Right Arm, Patient Position: Sitting, Cuff Size: Large)   Pulse 81   Temp 97.9 F (36.6 C) (Oral)   Ht 6\' 1"  (1.854 m)   Wt 239 lb (108.4 kg)   SpO2 97%   BMI 31.53 kg/m   BP Readings from Last 3 Encounters:  08/20/22 112/70  03/26/22 103/62  03/25/22 110/72    Wt Readings from Last 3 Encounters:  08/20/22 239 lb (108.4 kg)  03/25/22 248 lb 14.4 oz (112.9 kg)  02/25/22 249 lb (112.9 kg)  Physical Exam  Lab Results  Component Value Date   WBC 4.0 08/20/2022   HGB 13.5 08/20/2022   HCT 38.6 (L) 08/20/2022   PLT 208.0 08/20/2022   GLUCOSE 84 08/20/2022   CHOL 77 08/20/2022   TRIG 107.0 08/20/2022   HDL 17.60 (L) 08/20/2022   LDLCALC 38 08/20/2022   ALT 37 08/20/2022   AST 30 08/20/2022   NA 139 08/20/2022   K 4.0 08/20/2022   CL 106 08/20/2022   CREATININE 0.82 08/20/2022   BUN 12 08/20/2022   CO2 26 08/20/2022   TSH 2.09 11/19/2021   PSA 0.29 11/19/2021   HGBA1C 6.0 08/20/2022    MICROALBUR 2.2 (H) 02/18/2022    CT CHEST LUNG CA SCREEN LOW DOSE W/O CM  Result Date: 07/09/2022 CLINICAL DATA:  57 year old male former smoker (quit 7 years ago) with 59 pack-year history of smoking. Lung cancer screening examination. EXAM: CT CHEST WITHOUT CONTRAST LOW-DOSE FOR LUNG CANCER SCREENING TECHNIQUE: Multidetector CT imaging of the chest was performed following the standard protocol without IV contrast. RADIATION DOSE REDUCTION: This exam was performed according to the departmental dose-optimization program which includes automated exposure control, adjustment of the mA and/or kV according to patient size and/or use of iterative reconstruction technique. COMPARISON:  Low-dose lung cancer screening chest CT 07/03/2021. FINDINGS: Cardiovascular: Heart size is normal. There is no significant pericardial fluid, thickening or pericardial calcification. There is aortic atherosclerosis, as well as atherosclerosis of the great vessels of the mediastinum and the coronary arteries, including calcified atherosclerotic plaque in the left main and left anterior descending coronary arteries. Mediastinum/Nodes: No pathologically enlarged mediastinal or hilar lymph nodes. Densely calcified left hilar and mediastinal lymph nodes are incidentally noted. Esophagus is unremarkable in appearance. No axillary lymphadenopathy. Lungs/Pleura: Small pulmonary nodules are again noted, largest of which is a ground-glass attenuation nodule in the medial aspect of the left upper lobe near the apex (axial image 59), with a volume derived mean diameter of 10.8 mm, similar to prior studies. No larger more suspicious appearing pulmonary nodules or masses are noted. No acute consolidative airspace disease. No pleural effusions. Mild diffuse bronchial wall thickening with very mild centrilobular and paraseptal emphysema. Upper Abdomen: Exophytic intermediate attenuation lesion (30HU), simple in appearance on recent contrast enhanced  CT of the abdomen and pelvis, presumably a small proteinaceous/hemorrhagic cysts (no imaging follow-up recommended). Cholelithiasis with partially calcified stone measuring 1.2 cm near the neck of the gallbladder. Musculoskeletal: There are no aggressive appearing lytic or blastic lesions noted in the visualized portions of the skeleton. IMPRESSION: 1. Lung-RADS 2S, benign appearance or behavior. Continue annual screening with low-dose chest CT without contrast in 12 months. 2. The "S" modifier above refers to potentially clinically significant non lung cancer related findings. Specifically, there is aortic atherosclerosis, in addition to left main and left anterior descending coronary artery disease. Please note that although the presence of coronary artery calcium documents the presence of coronary artery disease, the severity of this disease and any potential stenosis cannot be assessed on this non-gated CT examination. Assessment for potential risk factor modification, dietary therapy or pharmacologic therapy may be warranted, if clinically indicated. 3. Mild diffuse bronchial wall thickening with mild centrilobular and paraseptal emphysema; imaging findings suggestive of underlying COPD. Aortic Atherosclerosis (ICD10-I70.0) and Emphysema (ICD10-J43.9). Electronically Signed   By: Trudie Reed M.D.   On: 07/09/2022 12:15   Assessment & Plan:  Hyperlipidemia LDL goal <70 -     Lipid panel; Future -  Hepatic function panel; Future  Primary hypertension -     Basic metabolic panel; Future -     CBC with Differential/Platelet; Future -     Hepatic function panel; Future  Type II diabetes mellitus with manifestations (HCC) -     Basic metabolic panel; Future -     Hemoglobin A1c; Future  Idiopathic hypotension -     Cortisol; Future  Macrocytic anemia -     Vitamin B1; Future -     Zinc; Future -     Folate; Future -     Vitamin B12; Future     Follow-up: Return in about 6 months  (around 02/19/2023).  Sanda Linger, MD

## 2022-08-20 NOTE — Patient Instructions (Signed)

## 2022-08-21 ENCOUNTER — Encounter: Payer: Self-pay | Admitting: Internal Medicine

## 2022-08-21 ENCOUNTER — Other Ambulatory Visit: Payer: BC Managed Care – PPO

## 2022-08-21 DIAGNOSIS — D539 Nutritional anemia, unspecified: Secondary | ICD-10-CM | POA: Diagnosis not present

## 2022-08-21 LAB — FOLATE: Folate: 10.1 ng/mL (ref >5.9)

## 2022-08-21 LAB — VITAMIN B12: Vitamin B-12: 404 pg/mL (ref 211–911)

## 2022-08-22 ENCOUNTER — Encounter: Payer: Self-pay | Admitting: Internal Medicine

## 2022-08-25 DIAGNOSIS — D538 Other specified nutritional anemias: Secondary | ICD-10-CM | POA: Insufficient documentation

## 2022-08-25 LAB — ZINC: Zinc: 74 ug/dL (ref 60–130)

## 2022-08-25 LAB — VITAMIN B1: Vitamin B1 (Thiamine): 6 nmol/L — ABNORMAL LOW (ref 8–30)

## 2022-08-25 MED ORDER — VITAMIN B-1 50 MG PO TABS: 50.0000 mg | ORAL_TABLET | Freq: Every day | ORAL | 1 refills | Status: AC

## 2022-08-26 ENCOUNTER — Telehealth: Payer: BC Managed Care – PPO | Admitting: Family Medicine

## 2022-08-26 ENCOUNTER — Telehealth: Payer: BC Managed Care – PPO

## 2022-08-26 DIAGNOSIS — U071 COVID-19: Secondary | ICD-10-CM

## 2022-08-26 MED ORDER — NIRMATRELVIR/RITONAVIR (PAXLOVID)TABLET: 3.0000 | ORAL_TABLET | Freq: Two times a day (BID) | ORAL | 0 refills | Status: AC

## 2022-08-26 MED ORDER — BENZONATATE 100 MG PO CAPS: 100.0000 mg | ORAL_CAPSULE | Freq: Three times a day (TID) | ORAL | 0 refills | Status: DC | PRN

## 2022-08-26 NOTE — Patient Instructions (Addendum)
Casey [name], thank you for joining [name], NP for today's virtual visit.  While this provider is not your primary care provider (PCP), if your PCP is located in our provider database this encounter information will be shared with them immediately following your visit.   A [facility] MyChart account gives you access to today's visit and all your visits, tests, and labs performed at [facility] " click here if you don't have a [facility] MyChart account or go to [email or web address]  Consent: (Patient) Casey [name] provided verbal consent for this virtual visit at the beginning of the encounter.  Current Medications:  Current Outpatient Medications:    benzonatate (TESSALON) 100 MG capsule, Take 1 capsule (100 mg total) by mouth 3 (three) times daily as needed for cough., Disp: 30 capsule, Rfl: 0   nirmatrelvir/ritonavir (PAXLOVID) 20 x 150 MG & 10 x 100MG  TABS, Take 3 tablets by mouth 2 (two) times daily for 5 days. (Take nirmatrelvir 150 mg two tablets twice daily for 5 days and ritonavir 100 mg one tablet twice daily for 5 days) Patient GFR is 97.75, Disp: 30 tablet, Rfl: 0   ALPRAZolam (XANAX) 0.5 MG tablet, Take 1 tablet (0.5 mg total) by mouth 3 (three) times daily as needed for anxiety., Disp: 90 tablet, Rfl: 2   augmented betamethasone dipropionate (DIPROLENE-AF) 0.05 % cream, Apply to affected area twice daily as needed., Disp: 50 g, Rfl: 1   metFORMIN (GLUCOPHAGE-XR) 750 MG 24 hr tablet, Take 1 tablet (750 mg total) by mouth daily with breakfast., Disp: 90 tablet, Rfl: 0   ondansetron (ZOFRAN-ODT) 4 MG disintegrating tablet, Take 1 tablet (4 mg total) by mouth every 8 (eight) hours as needed for nausea or vomiting., Disp: 20 tablet, Rfl: 0   rosuvastatin (CRESTOR) 5 MG tablet, Take 1 tablet (5 mg total) by mouth daily., Disp: 90 tablet, Rfl: 0   thiamine (VITAMIN B-1) 50 MG tablet, Take 1 tablet (50 mg total) by mouth daily., Disp: 90 tablet, Rfl: 1    Medications ordered in this encounter:  Meds ordered this encounter  Medications   nirmatrelvir/ritonavir (PAXLOVID) 20 x 150 MG & 10 x 100MG  TABS    Sig: Take 3 tablets by mouth 2 (two) times daily for 5 days. (Take nirmatrelvir 150 mg two tablets twice daily for 5 days and ritonavir 100 mg one tablet twice daily for 5 days) Patient GFR is 97.75    Dispense:  30 tablet    Refill:  0    Order Specific Question:   Supervising Provider    Answer:   Merrilee Jansky [[id number]]   benzonatate (TESSALON) 100 MG capsule    Sig: Take 1 capsule (100 mg total) by mouth 3 (three) times daily as needed for cough.    Dispense:  30 capsule    Refill:  0    Order Specific Question:   Supervising Provider    Answer:   Merrilee Jansky [id number]     *If you need refills on other medications prior to your next appointment, please contact your pharmacy*  Follow-Up: Call back or seek an in-person evaluation if the symptoms worsen or if the condition fails to improve as anticipated.  [facility] [phone]  Care Instructions:  - Continue OTC symptomatic management of choice - Take prescribed medications as directed - Push fluids - Rest as needed - Discussed return precautions and when to seek in-person evaluation, sent via AVS as well   Isolation Instructions:  You are to isolate at home until you have been fever free for at least 24 hours without a fever-reducing medication, and symptoms have been steadily improving for 24 hours. At that time,  you can end isolation but need to mask for an additional 5 days.   If you must be around other household members who do not have symptoms, you need to make sure that both you and the family members are masking consistently with a high-quality mask.  If you note any worsening of symptoms despite treatment, please seek an in-person evaluation ASAP. If you note any significant shortness of breath or any chest pain, please seek ER  evaluation. Please do not delay care!   COVID-19: What to Do if You Are Sick If you test positive and are an older adult or someone who is at high risk of getting very sick from COVID-19, treatment may be available. Contact a healthcare provider right away after a positive test to determine if you are eligible, even if your symptoms are mild right now. You can also visit a Test to Treat location and, if eligible, receive a prescription from a provider. Don't delay: Treatment must be started within the first few days to be effective. If you have a fever, cough, or other symptoms, you might have COVID-19. Most people have mild illness and are able to recover at home. If you are sick: Keep track of your symptoms. If you have an emergency warning sign (including trouble breathing), call 911. Steps to help prevent the spread of COVID-19 if you are sick If you are sick with COVID-19 or think you might have COVID-19, follow the steps below to care for yourself and to help protect other people in your home and community. Stay home except to get medical care Stay home. Most people with COVID-19 have mild illness and can recover at home without medical care. Do not leave your home, except to get medical care. Do not visit public areas and do not go to places where you are unable to wear a mask. Take care of yourself. Get rest and stay hydrated. Take over-the-counter medicines, such as acetaminophen, to help you feel better. Stay in touch with your doctor. Call before you get medical care. Be sure to get care if you have trouble breathing, or have any other emergency warning signs, or if you think it is an emergency. Avoid public transportation, ride-sharing, or taxis if possible. Get tested If you have symptoms of COVID-19, get tested. While waiting for test results, stay away from others, including staying apart from those living in your household. Get tested as soon as possible after your symptoms start.  Treatments may be available for people with COVID-19 who are at risk for becoming very sick. Don't delay: Treatment must be started early to be effective--some treatments must begin within 5 days of your first symptoms. Contact your healthcare provider right away if your test result is positive to determine if you are eligible. Self-tests are one of several options for testing for the virus that causes COVID-19 and may be more convenient than laboratory-based tests and point-of-care tests. Ask your healthcare provider or your local health department if you need help interpreting your test results. You can visit your state, tribal, local, and territorial health department's website to look for the latest local information on testing sites. Separate yourself from other people As much as possible, stay in a specific room and away from other people and pets in your home. If  possible, you should use a separate bathroom. If you need to be around other people or animals in or outside of the home, wear a well-fitting mask. Tell your close contacts that they may have been exposed to COVID-19. An infected person can spread COVID-19 starting 48 hours (or 2 days) before the person has any symptoms or tests positive. By letting your close contacts know they may have been exposed to COVID-19, you are helping to protect everyone. See COVID-19 and Animals if you have questions about pets. If you are diagnosed with COVID-19, someone from the health department may call you. Answer the call to slow the spread. Monitor your symptoms Symptoms of COVID-19 include fever, cough, or other symptoms. Follow care instructions from your healthcare provider and local health department. Your local health authorities may give instructions on checking your symptoms and reporting information. When to seek emergency medical attention Look for emergency warning signs* for COVID-19. If someone is showing any of these signs, seek emergency  medical care immediately: Trouble breathing Persistent pain or pressure in the chest New confusion Inability to wake or stay awake Pale, gray, or blue-colored skin, lips, or nail beds, depending on skin tone *This list is not all possible symptoms. Please call your medical provider for any other symptoms that are severe or concerning to you. Call 911 or call ahead to your local emergency facility: Notify the operator that you are seeking care for someone who has or may have COVID-19. Call ahead before visiting your doctor Call ahead. Many medical visits for routine care are being postponed or done by phone or telemedicine. If you have a medical appointment that cannot be postponed, call your doctor's office, and tell them you have or may have COVID-19. This will help the office protect themselves and other patients. If you are sick, wear a well-fitting mask You should wear a mask if you must be around other people or animals, including pets (even at home). Wear a mask with the best fit, protection, and comfort for you. You don't need to wear the mask if you are alone. If you can't put on a mask (because of trouble breathing, for example), cover your coughs and sneezes in some other way. Try to stay at least 6 feet away from other people. This will help protect the people around you. Masks should not be placed on young children under age 52 years, anyone who has trouble breathing, or anyone who is not able to remove the mask without help. Cover your coughs and sneezes Cover your mouth and nose with a tissue when you cough or sneeze. Throw away used tissues in a lined trash can. Immediately wash your hands with soap and water for at least 20 seconds. If soap and water are not available, clean your hands with an alcohol-based hand sanitizer that contains at least 60% alcohol. Clean your hands often Wash your hands often with soap and water for at least 20 seconds. This is especially important after  blowing your nose, coughing, or sneezing; going to the bathroom; and before eating or preparing food. Use hand sanitizer if soap and water are not available. Use an alcohol-based hand sanitizer with at least 60% alcohol, covering all surfaces of your hands and rubbing them together until they feel dry. Soap and water are the best option, especially if hands are visibly dirty. Avoid touching your eyes, nose, and mouth with unwashed hands. Handwashing Tips Avoid sharing personal household items Do not share dishes, drinking glasses, cups,  eating utensils, towels, or bedding with other people in your home. Wash these items thoroughly after using them with soap and water or put in the dishwasher. Clean surfaces in your home regularly Clean and disinfect high-touch surfaces (for example, doorknobs, tables, handles, light switches, and countertops) in your "sick room" and bathroom. In shared spaces, you should clean and disinfect surfaces and items after each use by the person who is ill. If you are sick and cannot clean, a caregiver or other person should only clean and disinfect the area around you (such as your bedroom and bathroom) on an as needed basis. Your caregiver/other person should wait as long as possible (at least several hours) and wear a mask before entering, cleaning, and disinfecting shared spaces that you use. Clean and disinfect areas that may have blood, stool, or body fluids on them. Use household cleaners and disinfectants. Clean visible dirty surfaces with household cleaners containing soap or detergent. Then, use a household disinfectant. Use a product from [employer] List N: Disinfectants for Coronavirus (COVID-19). Be sure to follow the instructions on the label to ensure safe and effective use of the product. Many products recommend keeping the surface wet with a disinfectant for a certain period of time (look at "contact time" on the product label). You may also need to wear personal  protective equipment, such as gloves, depending on the directions on the product label. Immediately after disinfecting, wash your hands with soap and water for 20 seconds. For completed guidance on cleaning and disinfecting your home, visit Complete Disinfection Guidance. Take steps to improve ventilation at home Improve ventilation (air flow) at home to help prevent from spreading COVID-19 to other people in your household. Clear out COVID-19 virus particles in the air by opening windows, using air filters, and turning on fans in your home. Use this interactive tool to learn how to improve air flow in your home. When you can be around others after being sick with COVID-19 Deciding when you can be around others is different for different situations. Find out when you can safely end home isolation. For any additional questions about your care, contact your healthcare provider or state or local health department. 05/14/2020 Content source: [facility] for Immunization and Respiratory Diseases ([facility]), Division of Viral Diseases This information is not intended to replace advice given to you by your health care provider. Make sure you discuss any questions you have with your health care provider. Document Revised: 06/27/2020 Document Reviewed: 06/27/2020 Elsevier Patient Education  2022 [employer].     If you have been instructed to have an in-person evaluation today at a local Urgent Care facility, please use the link below. It will take you to a list of all of our available [facility], including address, phone number and hours of operation. Please do not delay care.  [facility]  If you or a family member do not have a primary care provider, use the link below to schedule a visit and establish care. When you choose a [facility] primary care physician or advanced practice provider, you gain a long-term partner in health. Find a Primary Care Provider  Learn  more about 's in-office and virtual care options:  - Get Care Now

## 2022-08-26 NOTE — Progress Notes (Signed)
Virtual Visit Consent   [name], you are scheduled for a virtual visit with a [facility] provider today. Just as with appointments in the office, your consent must be obtained to participate. Your consent will be active for this visit and any virtual visit you may have with one of our providers in the next 365 days. If you have a MyChart account, a copy of this consent can be sent to you electronically.  As this is a virtual visit, video technology does not allow for your provider to perform a traditional examination. This may limit your provider's ability to fully assess your condition. If your provider identifies any concerns that need to be evaluated in person or the need to arrange testing (such as labs, EKG, etc.), we will make arrangements to do so. Although advances in technology are sophisticated, we cannot ensure that it will always work on either your end or our end. If the connection with a video visit is poor, the visit may have to be switched to a telephone visit. With either a video or telephone visit, we are not always able to ensure that we have a secure connection.  By engaging in this virtual visit, you consent to the provision of healthcare and authorize for your insurance to be billed (if applicable) for the services provided during this visit. Depending on your insurance coverage, you may receive a charge related to this service.  I need to obtain your verbal consent now. Are you willing to proceed with your visit today? [name] has provided verbal consent on 08/26/2022 for a virtual visit (video or telephone). [name], NP  Date: 08/26/2022 1:41 PM  Virtual Visit via Video Note   I, [name], connected with  Casey [name]  ([id number], 1965/03/04) on 08/26/22 at  2:00 PM EDT by a video-enabled telemedicine application and verified that I am speaking with the correct person using two identifiers.  Location: Patient: Virtual Visit Location Patient:  Home Provider: Virtual Visit Location Provider: Home Office   I discussed the limitations of evaluation and management by telemedicine and the availability of in person appointments. The patient expressed understanding and agreed to proceed.    History of Present Illness: Casey [name] is a 57 y.o. who identifies as a male who was assigned male at birth, and is being seen today for COVID  Onset was today with congestion and not feeling great Associated symptoms are cough, discomfort in sore throat, Modifying factors are ibuprofen  Denies chest pain, shortness of breath, fevers, chills, ear pain  Exposure to sick contacts- unknown COVID test: positive on home test Vaccines: last vaccine was 10/2021   Problems:  Patient Active Problem List   Diagnosis Date Noted   Anemia due to acquired thiamine deficiency 08/25/2022   Macrocytic anemia 08/21/2022   Idiopathic hypotension 08/20/2022   NASH (nonalcoholic steatohepatitis) 02/18/2022   Need for pneumococcal 20-valent conjugate vaccination 02/18/2022   GAD (generalized anxiety disorder) 11/19/2021   Abnormal EKG 11/19/2021   Type II diabetes mellitus with manifestations (HCC) 11/19/2021   Hyperlipidemia LDL goal <70 11/19/2021   OSA (obstructive sleep apnea)    Encounter for general adult medical examination with abnormal findings 07/18/2020   Primary hypertension 07/18/2020   Patellofemoral pain syndrome of both knees 09/19/2018   Eczema 04/06/2015    Allergies:  Allergies  Allergen Reactions   Lisinopril Cough   Medications:  Current Outpatient Medications:    ALPRAZolam (XANAX) 0.5 MG tablet, Take 1 tablet (0.5  mg total) by mouth 3 (three) times daily as needed for anxiety., Disp: 90 tablet, Rfl: 2   augmented betamethasone dipropionate (DIPROLENE-AF) 0.05 % cream, Apply to affected area twice daily as needed., Disp: 50 g, Rfl: 1   metFORMIN (GLUCOPHAGE-XR) 750 MG 24 hr tablet, Take 1 tablet (750 mg total) by mouth daily with  breakfast., Disp: 90 tablet, Rfl: 0   ondansetron (ZOFRAN-ODT) 4 MG disintegrating tablet, Take 1 tablet (4 mg total) by mouth every 8 (eight) hours as needed for nausea or vomiting., Disp: 20 tablet, Rfl: 0   rosuvastatin (CRESTOR) 5 MG tablet, Take 1 tablet (5 mg total) by mouth daily., Disp: 90 tablet, Rfl: 0   thiamine (VITAMIN B-1) 50 MG tablet, Take 1 tablet (50 mg total) by mouth daily., Disp: 90 tablet, Rfl: 1  Observations/Objective: Patient is well-developed, well-nourished in no acute distress.  Resting comfortably at home.  Head is normocephalic, atraumatic.  No labored breathing.  Speech is clear and coherent with logical content.  Patient is alert and oriented at baseline.    Assessment and Plan:  1. COVID-19  - nirmatrelvir/ritonavir (PAXLOVID) 20 x 150 MG & 10 x 100MG  TABS; Take 3 tablets by mouth 2 (two) times daily for 5 days. (Take nirmatrelvir 150 mg two tablets twice daily for 5 days and ritonavir 100 mg one tablet twice daily for 5 days) Patient GFR is 97.75  Dispense: 30 tablet; Refill: 0 - benzonatate (TESSALON) 100 MG capsule; Take 1 capsule (100 mg total) by mouth 3 (three) times daily as needed for cough.  Dispense: 30 capsule; Refill: 0  -GFR 97.75 -OTC discussed symptom management  -info on AVS  - Continue OTC symptomatic management of choice - Take prescribed medications as directed - Push fluids - Rest as needed - Discussed return precautions and when to seek in-person evaluation, sent via AVS as well  Reviewed side effects, risks and benefits of medication.    Patient acknowledged agreement and understanding of the plan.   Past Medical, Surgical, Social History, Allergies, and Medications have been Reviewed.    Follow Up Instructions: I discussed the assessment and treatment plan with the patient. The patient was provided an opportunity to ask questions and all were answered. The patient agreed with the plan and demonstrated an understanding of  the instructions.  A copy of instructions were sent to the patient via MyChart unless otherwise noted below.    The patient was advised to call back or seek an in-person evaluation if the symptoms worsen or if the condition fails to improve as anticipated.  Time:  I spent 10 minutes with the patient via telehealth technology discussing the above problems/concerns.    [name], NP

## 2022-09-14 ENCOUNTER — Other Ambulatory Visit (HOSPITAL_COMMUNITY): Payer: Self-pay

## 2022-09-14 DIAGNOSIS — C679 Malignant neoplasm of bladder, unspecified: Secondary | ICD-10-CM | POA: Diagnosis not present

## 2022-09-14 DIAGNOSIS — Z87891 Personal history of nicotine dependence: Secondary | ICD-10-CM | POA: Diagnosis not present

## 2022-09-14 DIAGNOSIS — U071 COVID-19: Secondary | ICD-10-CM | POA: Diagnosis not present

## 2022-09-14 DIAGNOSIS — D649 Anemia, unspecified: Secondary | ICD-10-CM | POA: Diagnosis not present

## 2022-09-14 MED ORDER — METFORMIN HCL 500 MG PO TABS
500.0000 mg | ORAL_TABLET | Freq: Every day | ORAL | 0 refills | Status: DC
  Filled 2022-09-14: qty 90, 90d supply, fill #0

## 2022-09-14 MED ORDER — ROSUVASTATIN CALCIUM 5 MG PO TABS
5.0000 mg | ORAL_TABLET | Freq: Every day | ORAL | 0 refills | Status: DC
  Filled 2023-06-22: qty 90, 90d supply, fill #0

## 2022-09-22 DIAGNOSIS — D414 Neoplasm of uncertain behavior of bladder: Secondary | ICD-10-CM | POA: Diagnosis not present

## 2022-09-29 ENCOUNTER — Other Ambulatory Visit (HOSPITAL_COMMUNITY): Payer: Self-pay

## 2022-09-29 DIAGNOSIS — F331 Major depressive disorder, recurrent, moderate: Secondary | ICD-10-CM | POA: Diagnosis not present

## 2022-09-29 DIAGNOSIS — F411 Generalized anxiety disorder: Secondary | ICD-10-CM | POA: Diagnosis not present

## 2022-09-29 DIAGNOSIS — G4700 Insomnia, unspecified: Secondary | ICD-10-CM | POA: Diagnosis not present

## 2022-09-29 DIAGNOSIS — I10 Essential (primary) hypertension: Secondary | ICD-10-CM | POA: Diagnosis not present

## 2022-09-29 MED ORDER — OLMESARTAN MEDOXOMIL 20 MG PO TABS
20.0000 mg | ORAL_TABLET | Freq: Every day | ORAL | 0 refills | Status: DC
  Filled 2022-09-29: qty 90, 90d supply, fill #0

## 2022-09-30 ENCOUNTER — Other Ambulatory Visit (HOSPITAL_COMMUNITY): Payer: Self-pay

## 2022-10-12 DIAGNOSIS — C672 Malignant neoplasm of lateral wall of bladder: Secondary | ICD-10-CM | POA: Diagnosis not present

## 2022-10-12 DIAGNOSIS — Z5111 Encounter for antineoplastic chemotherapy: Secondary | ICD-10-CM | POA: Diagnosis not present

## 2022-10-19 DIAGNOSIS — Z5111 Encounter for antineoplastic chemotherapy: Secondary | ICD-10-CM | POA: Diagnosis not present

## 2022-10-19 DIAGNOSIS — D414 Neoplasm of uncertain behavior of bladder: Secondary | ICD-10-CM | POA: Diagnosis not present

## 2022-10-27 DIAGNOSIS — D414 Neoplasm of uncertain behavior of bladder: Secondary | ICD-10-CM | POA: Diagnosis not present

## 2022-10-27 DIAGNOSIS — Z5111 Encounter for antineoplastic chemotherapy: Secondary | ICD-10-CM | POA: Diagnosis not present

## 2022-11-10 ENCOUNTER — Other Ambulatory Visit (HOSPITAL_COMMUNITY): Payer: Self-pay

## 2022-11-10 DIAGNOSIS — J069 Acute upper respiratory infection, unspecified: Secondary | ICD-10-CM | POA: Diagnosis not present

## 2022-11-10 DIAGNOSIS — E782 Mixed hyperlipidemia: Secondary | ICD-10-CM | POA: Diagnosis not present

## 2022-11-10 DIAGNOSIS — Z23 Encounter for immunization: Secondary | ICD-10-CM | POA: Diagnosis not present

## 2022-11-10 DIAGNOSIS — E1165 Type 2 diabetes mellitus with hyperglycemia: Secondary | ICD-10-CM | POA: Diagnosis not present

## 2022-11-10 DIAGNOSIS — I10 Essential (primary) hypertension: Secondary | ICD-10-CM | POA: Diagnosis not present

## 2022-11-10 DIAGNOSIS — K219 Gastro-esophageal reflux disease without esophagitis: Secondary | ICD-10-CM | POA: Diagnosis not present

## 2022-11-10 DIAGNOSIS — Z1159 Encounter for screening for other viral diseases: Secondary | ICD-10-CM | POA: Diagnosis not present

## 2022-11-10 MED ORDER — OLMESARTAN MEDOXOMIL 20 MG PO TABS
20.0000 mg | ORAL_TABLET | Freq: Every day | ORAL | 3 refills | Status: DC
  Filled 2022-11-10 – 2022-12-10 (×2): qty 90, 90d supply, fill #0
  Filled 2023-03-24: qty 90, 90d supply, fill #1

## 2022-11-10 MED ORDER — METFORMIN HCL 500 MG PO TABS
500.0000 mg | ORAL_TABLET | Freq: Every day | ORAL | 1 refills | Status: DC
  Filled 2022-11-10 – 2022-12-10 (×2): qty 90, 90d supply, fill #0

## 2022-11-10 MED ORDER — AMOXICILLIN-POT CLAVULANATE 875-125 MG PO TABS
1.0000 | ORAL_TABLET | Freq: Two times a day (BID) | ORAL | 0 refills | Status: DC
  Filled 2022-11-10: qty 20, 10d supply, fill #0

## 2022-11-10 MED ORDER — PANTOPRAZOLE SODIUM 40 MG PO TBEC
40.0000 mg | DELAYED_RELEASE_TABLET | Freq: Two times a day (BID) | ORAL | 1 refills | Status: AC | PRN
  Filled 2022-11-10: qty 145, 73d supply, fill #0
  Filled 2023-02-02: qty 145, 73d supply, fill #1

## 2022-11-10 MED ORDER — ROSUVASTATIN CALCIUM 5 MG PO TABS
5.0000 mg | ORAL_TABLET | Freq: Every day | ORAL | 1 refills | Status: DC
  Filled 2022-11-10: qty 90, 90d supply, fill #0
  Filled 2023-02-21: qty 90, 90d supply, fill #1

## 2022-11-11 ENCOUNTER — Other Ambulatory Visit (HOSPITAL_COMMUNITY): Payer: Self-pay

## 2022-11-24 DIAGNOSIS — D3131 Benign neoplasm of right choroid: Secondary | ICD-10-CM | POA: Diagnosis not present

## 2022-11-24 DIAGNOSIS — H2513 Age-related nuclear cataract, bilateral: Secondary | ICD-10-CM | POA: Diagnosis not present

## 2022-11-24 DIAGNOSIS — H43813 Vitreous degeneration, bilateral: Secondary | ICD-10-CM | POA: Diagnosis not present

## 2022-12-10 ENCOUNTER — Other Ambulatory Visit (HOSPITAL_COMMUNITY): Payer: Self-pay

## 2022-12-25 DIAGNOSIS — D414 Neoplasm of uncertain behavior of bladder: Secondary | ICD-10-CM | POA: Diagnosis not present

## 2022-12-25 DIAGNOSIS — R8271 Bacteriuria: Secondary | ICD-10-CM | POA: Diagnosis not present

## 2022-12-28 ENCOUNTER — Ambulatory Visit (INDEPENDENT_AMBULATORY_CARE_PROVIDER_SITE_OTHER): Payer: BC Managed Care – PPO | Admitting: Podiatry

## 2022-12-28 ENCOUNTER — Encounter: Payer: Self-pay | Admitting: Podiatry

## 2022-12-28 DIAGNOSIS — L600 Ingrowing nail: Secondary | ICD-10-CM | POA: Diagnosis not present

## 2022-12-28 DIAGNOSIS — E118 Type 2 diabetes mellitus with unspecified complications: Secondary | ICD-10-CM | POA: Diagnosis not present

## 2022-12-28 DIAGNOSIS — E119 Type 2 diabetes mellitus without complications: Secondary | ICD-10-CM

## 2022-12-29 NOTE — Progress Notes (Signed)
  Subjective:  Patient ID: Casey [name], male    DOB: December 17, 1965,  MRN: [id number]  Chief Complaint  Patient presents with   Diabetes    "I think we're following up from last year.  My nails are okay.  I have a little piece in the corner on the left big toe.  It's not giving me any pain."    57 y.o. male presents with the above complaint. History confirmed with patient.  No other new issues his A1c is well-controlled at 6%  Objective:  Physical Exam: warm, good capillary refill, no trophic changes or ulcerative lesions, normal DP and PT pulses, normal sensory exam, and matricectomy sites have healed well there is some residual spicule on the medial and lateral borders of the left hallux  Assessment:   1. Ingrowing left great toenail   2. Type II diabetes mellitus with manifestations (HCC)   3. Encounter for diabetic foot exam [facility])      Plan:  Patient was evaluated and treated and all questions answered.   Patient educated on diabetes. Discussed proper diabetic foot care and discussed risks and complications of disease. Educated patient in depth on reasons to return to the office immediately should he/she discover anything concerning or new on the feet. All questions answered. Discussed proper shoes as well.   Spicules and recurrence of nail growth on the left hallux was debrided sharply with a sharp nail nipper to a tolerable level.  Hopefully this represents residual nail material and not regrowth of new nail that would necessitate repeat procedure.  I will see him back in 1 year for annual follow-up  Return in about 1 year (around 12/28/2023) for diabetic foot examination .

## 2023-01-05 DIAGNOSIS — E785 Hyperlipidemia, unspecified: Secondary | ICD-10-CM | POA: Diagnosis not present

## 2023-01-05 DIAGNOSIS — D649 Anemia, unspecified: Secondary | ICD-10-CM | POA: Diagnosis not present

## 2023-01-05 DIAGNOSIS — R5383 Other fatigue: Secondary | ICD-10-CM | POA: Diagnosis not present

## 2023-01-05 DIAGNOSIS — R739 Hyperglycemia, unspecified: Secondary | ICD-10-CM | POA: Diagnosis not present

## 2023-01-06 ENCOUNTER — Other Ambulatory Visit (HOSPITAL_COMMUNITY): Payer: Self-pay

## 2023-01-06 DIAGNOSIS — D225 Melanocytic nevi of trunk: Secondary | ICD-10-CM | POA: Diagnosis not present

## 2023-01-06 DIAGNOSIS — L280 Lichen simplex chronicus: Secondary | ICD-10-CM | POA: Diagnosis not present

## 2023-01-06 DIAGNOSIS — L821 Other seborrheic keratosis: Secondary | ICD-10-CM | POA: Diagnosis not present

## 2023-01-06 DIAGNOSIS — L814 Other melanin hyperpigmentation: Secondary | ICD-10-CM | POA: Diagnosis not present

## 2023-01-06 MED ORDER — FLUOCINONIDE 0.05 % EX OINT
TOPICAL_OINTMENT | CUTANEOUS | 2 refills | Status: AC
  Filled 2023-01-06: qty 60, 30d supply, fill #0
  Filled 2023-02-02: qty 60, 30d supply, fill #1
  Filled 2023-03-11: qty 60, 30d supply, fill #2

## 2023-01-07 ENCOUNTER — Other Ambulatory Visit (HOSPITAL_COMMUNITY): Payer: Self-pay

## 2023-01-08 ENCOUNTER — Other Ambulatory Visit (HOSPITAL_COMMUNITY): Payer: Self-pay

## 2023-01-11 ENCOUNTER — Other Ambulatory Visit (HOSPITAL_COMMUNITY): Payer: Self-pay

## 2023-01-11 DIAGNOSIS — Z789 Other specified health status: Secondary | ICD-10-CM | POA: Diagnosis not present

## 2023-01-11 DIAGNOSIS — E782 Mixed hyperlipidemia: Secondary | ICD-10-CM | POA: Diagnosis not present

## 2023-01-11 DIAGNOSIS — D509 Iron deficiency anemia, unspecified: Secondary | ICD-10-CM | POA: Diagnosis not present

## 2023-01-11 DIAGNOSIS — E1165 Type 2 diabetes mellitus with hyperglycemia: Secondary | ICD-10-CM | POA: Diagnosis not present

## 2023-01-11 MED ORDER — ONETOUCH ULTRA 2 W/DEVICE KIT
PACK | 0 refills | Status: AC
  Filled 2023-01-11: qty 1, 30d supply, fill #0

## 2023-01-11 MED ORDER — ONETOUCH DELICA PLUS LANCET30G MISC
11 refills | Status: AC
  Filled 2023-01-11: qty 100, 50d supply, fill #0
  Filled 2023-03-02: qty 100, 50d supply, fill #1
  Filled 2023-04-18: qty 100, 50d supply, fill #2
  Filled 2023-09-22: qty 100, 50d supply, fill #3

## 2023-01-11 MED ORDER — ONETOUCH ULTRA VI STRP
ORAL_STRIP | 11 refills | Status: AC
  Filled 2023-01-11: qty 100, 50d supply, fill #0
  Filled 2023-03-02: qty 100, 50d supply, fill #1
  Filled 2023-04-18: qty 100, 50d supply, fill #2
  Filled 2023-06-22: qty 100, 50d supply, fill #3
  Filled 2023-10-10: qty 100, 50d supply, fill #4

## 2023-01-11 MED ORDER — METFORMIN HCL 1000 MG PO TABS
1000.0000 mg | ORAL_TABLET | Freq: Two times a day (BID) | ORAL | 0 refills | Status: DC
  Filled 2023-01-11: qty 180, 90d supply, fill #0

## 2023-01-12 ENCOUNTER — Other Ambulatory Visit (HOSPITAL_COMMUNITY): Payer: Self-pay

## 2023-02-02 ENCOUNTER — Other Ambulatory Visit (HOSPITAL_COMMUNITY): Payer: Self-pay

## 2023-02-02 ENCOUNTER — Other Ambulatory Visit: Payer: Self-pay

## 2023-02-10 ENCOUNTER — Other Ambulatory Visit (HOSPITAL_COMMUNITY): Payer: Self-pay

## 2023-02-10 DIAGNOSIS — I10 Essential (primary) hypertension: Secondary | ICD-10-CM | POA: Diagnosis not present

## 2023-02-10 DIAGNOSIS — E1165 Type 2 diabetes mellitus with hyperglycemia: Secondary | ICD-10-CM | POA: Diagnosis not present

## 2023-02-10 DIAGNOSIS — K219 Gastro-esophageal reflux disease without esophagitis: Secondary | ICD-10-CM | POA: Diagnosis not present

## 2023-02-10 MED ORDER — OZEMPIC (0.25 OR 0.5 MG/DOSE) 2 MG/3ML ~~LOC~~ SOPN
0.2500 mg | PEN_INJECTOR | SUBCUTANEOUS | 2 refills | Status: AC
  Filled 2023-02-10: qty 3, 56d supply, fill #0
  Filled 2023-04-07: qty 3, 56d supply, fill #1
  Filled 2023-06-03: qty 3, 56d supply, fill #2

## 2023-02-11 ENCOUNTER — Other Ambulatory Visit (HOSPITAL_COMMUNITY): Payer: Self-pay

## 2023-02-22 ENCOUNTER — Other Ambulatory Visit (HOSPITAL_COMMUNITY): Payer: Self-pay

## 2023-03-02 ENCOUNTER — Other Ambulatory Visit (HOSPITAL_COMMUNITY): Payer: Self-pay

## 2023-03-02 ENCOUNTER — Other Ambulatory Visit: Payer: Self-pay

## 2023-03-07 IMAGING — CT CT CHEST LUNG CANCER SCREENING LOW DOSE W/O CM
1 of 2 series · 10 of 20 positions shown, 13 images · non-contrast
Comparison: CT chest dated 08/01/2018

CLINICAL DATA: 54-year-old male former smoker, quit 5 years ago,
with 59 pack-year history of smoking, for initial lung cancer
screening

EXAM:
CT CHEST WITHOUT CONTRAST LOW-DOSE FOR LUNG CANCER SCREENING
TECHNIQUE: Multidetector CT imaging of the chest was performed following the
standard protocol without IV contrast.

[ct lung segmentation data · axial · 0.93mm/px · z∈[-374,-374]mm · 10 of 375 frames shown]
[frame 1/375  mediastinal]
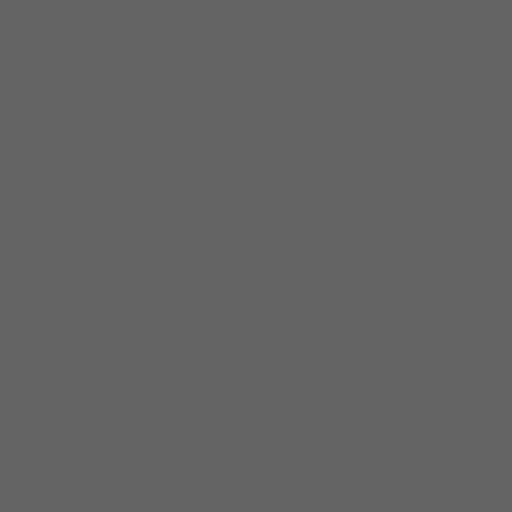
[frame 1/375  lung]
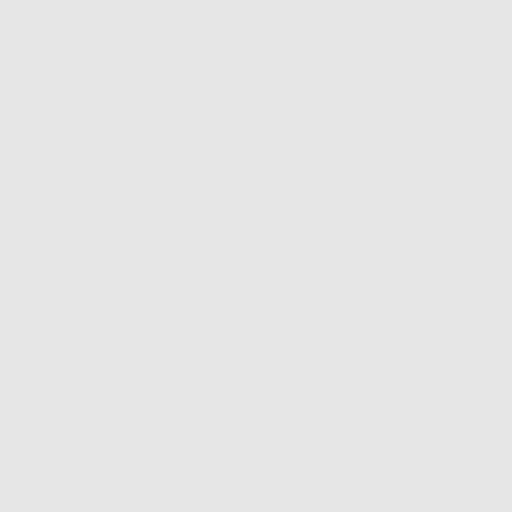
[frame 42/375  lung]
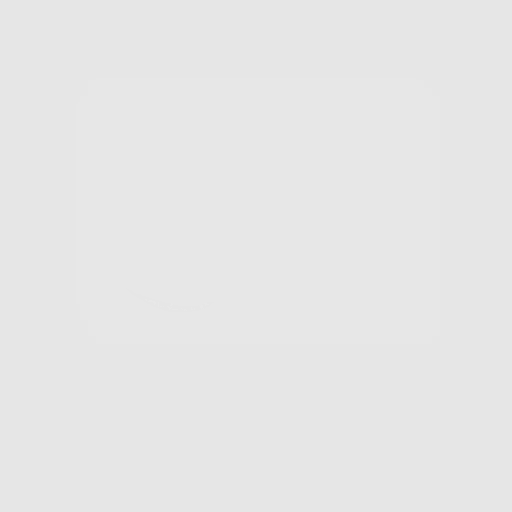
[frame 84/375  lung]
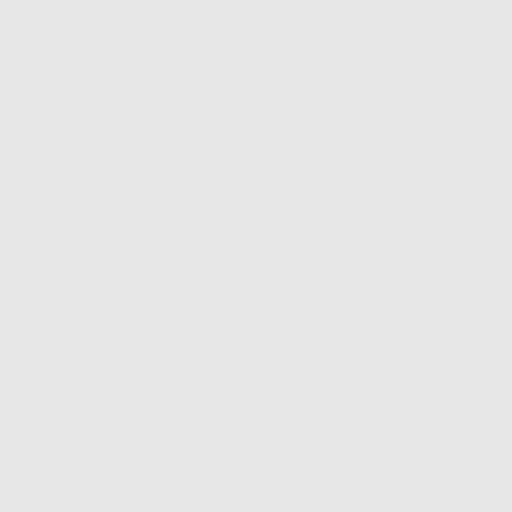
[frame 125/375  lung]
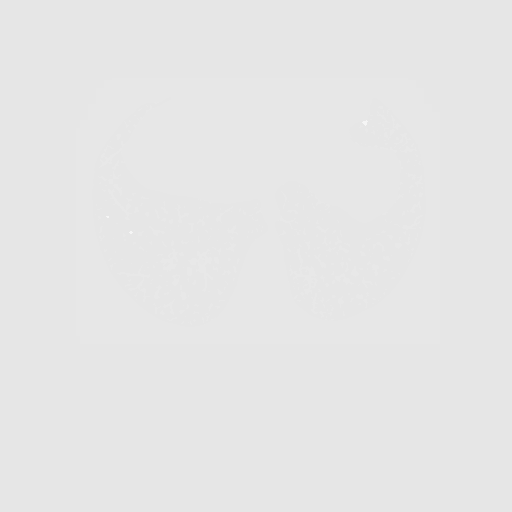
[frame 167/375  mediastinal]
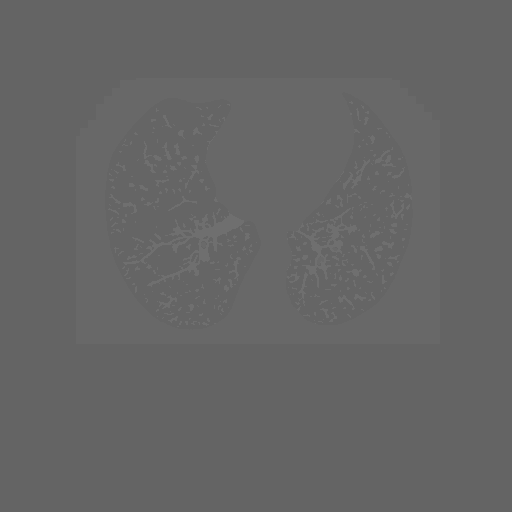
[frame 167/375  lung]
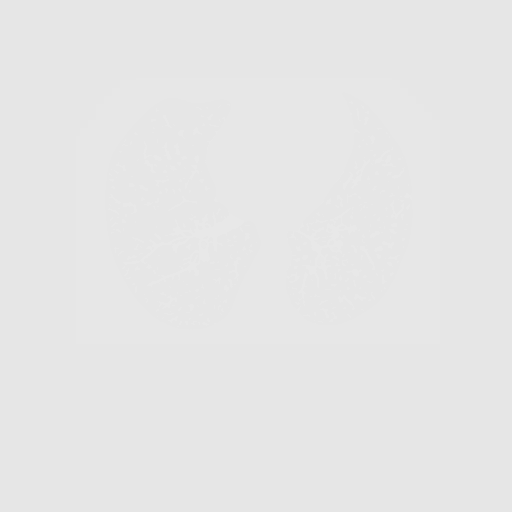
[frame 208/375  lung]
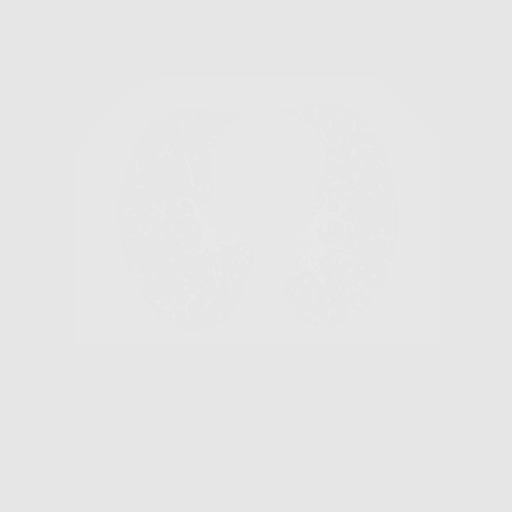
[frame 250/375  lung]
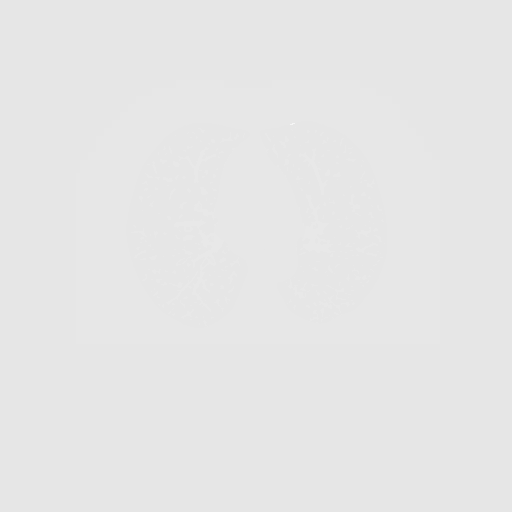
[frame 291/375  lung]
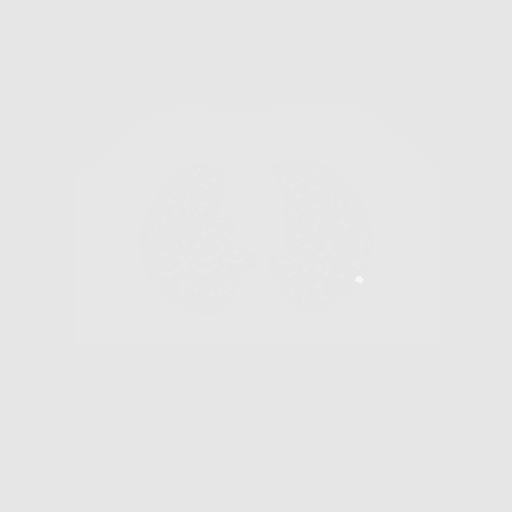
[frame 333/375  mediastinal]
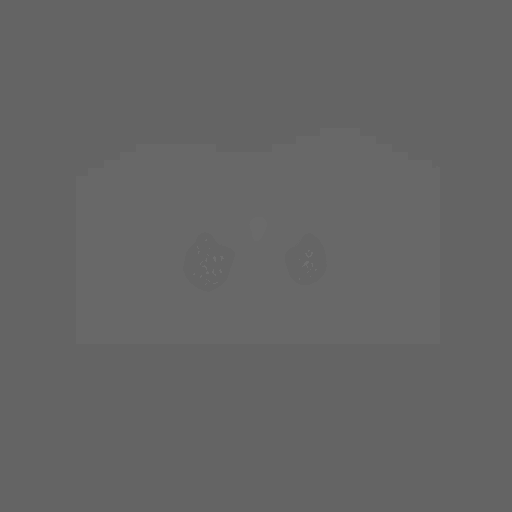
[frame 333/375  lung]
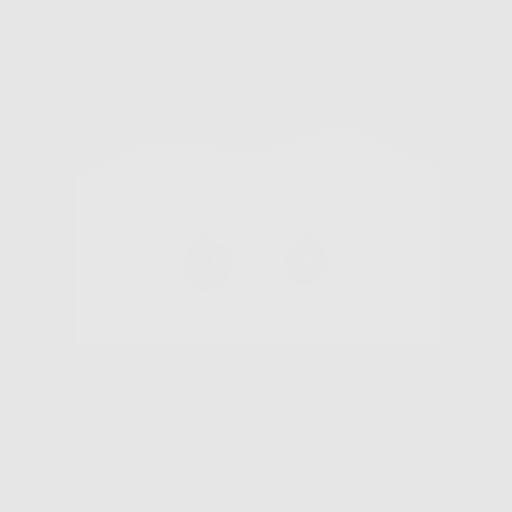
[frame 375/375  lung]
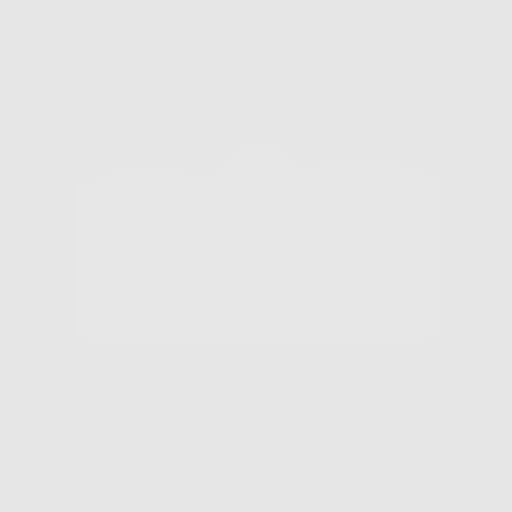

[10 of 20 positions shown; findings below may reference images not displayed]

FINDINGS: Cardiovascular: The heart is normal in size. No pericardial
effusion.

No evidence of thoracic aortic aneurysm.

Mediastinum/Nodes: Calcified left perihilar nodes, benign.

Visualized thyroid is unremarkable.

Lungs/Pleura: Patchy subpleural opacities, left lower lobe
predominant, unchanged from 1818. This appearance favors post
infectious/inflammatory scarring.

No focal consolidation.

6.7 mm calcified granuloma in the left upper lobe, benign.

No suspicious pulmonary nodules.

No pleural effusion or pneumothorax.

Upper Abdomen: Visualized upper abdomen is notable for
cholelithiasis and a 16 mm left upper pole renal cyst.

Musculoskeletal: Visualized osseous structures are within normal
limits.
IMPRESSION: Lung-RADS 1, negative. Continue annual screening with low-dose chest
CT without contrast in 12 months.

## 2023-03-11 ENCOUNTER — Other Ambulatory Visit (HOSPITAL_COMMUNITY): Payer: Self-pay

## 2023-03-11 ENCOUNTER — Other Ambulatory Visit: Payer: Self-pay

## 2023-03-15 ENCOUNTER — Other Ambulatory Visit (HOSPITAL_COMMUNITY): Payer: Self-pay

## 2023-03-15 DIAGNOSIS — E1165 Type 2 diabetes mellitus with hyperglycemia: Secondary | ICD-10-CM | POA: Diagnosis not present

## 2023-03-15 DIAGNOSIS — E1122 Type 2 diabetes mellitus with diabetic chronic kidney disease: Secondary | ICD-10-CM | POA: Diagnosis not present

## 2023-03-15 DIAGNOSIS — E1121 Type 2 diabetes mellitus with diabetic nephropathy: Secondary | ICD-10-CM | POA: Diagnosis not present

## 2023-03-15 MED ORDER — METFORMIN HCL 1000 MG PO TABS
1000.0000 mg | ORAL_TABLET | Freq: Two times a day (BID) | ORAL | 0 refills | Status: DC
  Filled 2023-03-15 – 2023-04-07 (×2): qty 180, 90d supply, fill #0

## 2023-04-01 DIAGNOSIS — C678 Malignant neoplasm of overlapping sites of bladder: Secondary | ICD-10-CM | POA: Diagnosis not present

## 2023-04-07 ENCOUNTER — Other Ambulatory Visit: Payer: Self-pay

## 2023-04-07 ENCOUNTER — Other Ambulatory Visit (HOSPITAL_COMMUNITY): Payer: Self-pay

## 2023-04-08 ENCOUNTER — Other Ambulatory Visit (HOSPITAL_COMMUNITY): Payer: Self-pay

## 2023-04-08 DIAGNOSIS — L280 Lichen simplex chronicus: Secondary | ICD-10-CM | POA: Diagnosis not present

## 2023-04-08 DIAGNOSIS — L0101 Non-bullous impetigo: Secondary | ICD-10-CM | POA: Diagnosis not present

## 2023-04-08 MED ORDER — CEFDINIR 300 MG PO CAPS
300.0000 mg | ORAL_CAPSULE | Freq: Two times a day (BID) | ORAL | 0 refills | Status: AC
  Filled 2023-04-08: qty 14, 7d supply, fill #0

## 2023-04-08 MED ORDER — TRIAMCINOLONE ACETONIDE 0.1 % EX CREA
TOPICAL_CREAM | CUTANEOUS | 1 refills | Status: AC
  Filled 2023-04-08: qty 454, 30d supply, fill #0

## 2023-04-09 DIAGNOSIS — I10 Essential (primary) hypertension: Secondary | ICD-10-CM | POA: Diagnosis not present

## 2023-04-09 DIAGNOSIS — R739 Hyperglycemia, unspecified: Secondary | ICD-10-CM | POA: Diagnosis not present

## 2023-04-13 ENCOUNTER — Other Ambulatory Visit (HOSPITAL_COMMUNITY): Payer: Self-pay

## 2023-04-13 DIAGNOSIS — E1165 Type 2 diabetes mellitus with hyperglycemia: Secondary | ICD-10-CM | POA: Diagnosis not present

## 2023-04-13 DIAGNOSIS — K219 Gastro-esophageal reflux disease without esophagitis: Secondary | ICD-10-CM | POA: Diagnosis not present

## 2023-04-13 DIAGNOSIS — I10 Essential (primary) hypertension: Secondary | ICD-10-CM | POA: Diagnosis not present

## 2023-04-13 MED ORDER — OZEMPIC (0.25 OR 0.5 MG/DOSE) 2 MG/3ML ~~LOC~~ SOPN
0.5000 mg | PEN_INJECTOR | SUBCUTANEOUS | 2 refills | Status: AC
  Filled 2023-04-13 – 2023-05-01 (×3): qty 3, 28d supply, fill #0
  Filled 2023-09-16: qty 3, 28d supply, fill #1
  Filled 2023-10-19: qty 3, 28d supply, fill #2

## 2023-04-13 MED ORDER — OLMESARTAN MEDOXOMIL 40 MG PO TABS
40.0000 mg | ORAL_TABLET | Freq: Every day | ORAL | 3 refills | Status: DC
  Filled 2023-04-13: qty 90, 90d supply, fill #0

## 2023-04-13 MED ORDER — METFORMIN HCL 1000 MG PO TABS
500.0000 mg | ORAL_TABLET | ORAL | 0 refills | Status: DC
  Filled 2023-04-13: qty 90, 90d supply, fill #0

## 2023-04-14 ENCOUNTER — Other Ambulatory Visit (HOSPITAL_COMMUNITY): Payer: Self-pay

## 2023-04-19 ENCOUNTER — Other Ambulatory Visit: Payer: Self-pay

## 2023-04-19 ENCOUNTER — Other Ambulatory Visit (HOSPITAL_COMMUNITY): Payer: Self-pay

## 2023-04-19 DIAGNOSIS — C678 Malignant neoplasm of overlapping sites of bladder: Secondary | ICD-10-CM | POA: Diagnosis not present

## 2023-04-20 ENCOUNTER — Other Ambulatory Visit (HOSPITAL_COMMUNITY): Payer: Self-pay

## 2023-04-27 DIAGNOSIS — C678 Malignant neoplasm of overlapping sites of bladder: Secondary | ICD-10-CM | POA: Diagnosis not present

## 2023-04-27 DIAGNOSIS — Z5111 Encounter for antineoplastic chemotherapy: Secondary | ICD-10-CM | POA: Diagnosis not present

## 2023-04-30 ENCOUNTER — Other Ambulatory Visit (HOSPITAL_COMMUNITY): Payer: Self-pay

## 2023-04-30 ENCOUNTER — Other Ambulatory Visit: Payer: Self-pay

## 2023-05-01 ENCOUNTER — Other Ambulatory Visit (HOSPITAL_BASED_OUTPATIENT_CLINIC_OR_DEPARTMENT_OTHER): Payer: Self-pay

## 2023-05-01 ENCOUNTER — Other Ambulatory Visit (HOSPITAL_COMMUNITY): Payer: Self-pay

## 2023-05-03 ENCOUNTER — Other Ambulatory Visit (HOSPITAL_COMMUNITY): Payer: Self-pay

## 2023-05-04 DIAGNOSIS — C678 Malignant neoplasm of overlapping sites of bladder: Secondary | ICD-10-CM | POA: Diagnosis not present

## 2023-05-17 ENCOUNTER — Other Ambulatory Visit (HOSPITAL_COMMUNITY): Payer: Self-pay

## 2023-05-25 ENCOUNTER — Other Ambulatory Visit (HOSPITAL_COMMUNITY): Payer: Self-pay

## 2023-05-25 DIAGNOSIS — I10 Essential (primary) hypertension: Secondary | ICD-10-CM | POA: Diagnosis not present

## 2023-05-25 DIAGNOSIS — E1121 Type 2 diabetes mellitus with diabetic nephropathy: Secondary | ICD-10-CM | POA: Diagnosis not present

## 2023-05-25 DIAGNOSIS — E1165 Type 2 diabetes mellitus with hyperglycemia: Secondary | ICD-10-CM | POA: Diagnosis not present

## 2023-05-25 DIAGNOSIS — Z23 Encounter for immunization: Secondary | ICD-10-CM | POA: Diagnosis not present

## 2023-05-25 MED ORDER — PANTOPRAZOLE SODIUM 40 MG PO TBEC
40.0000 mg | DELAYED_RELEASE_TABLET | Freq: Every day | ORAL | 1 refills | Status: AC
  Filled 2023-05-25: qty 90, 90d supply, fill #0

## 2023-05-25 MED ORDER — OZEMPIC (0.25 OR 0.5 MG/DOSE) 2 MG/3ML ~~LOC~~ SOPN
0.2500 mg | PEN_INJECTOR | SUBCUTANEOUS | 2 refills | Status: AC
  Filled 2023-05-25 – 2023-08-05 (×2): qty 3, 56d supply, fill #0

## 2023-05-28 ENCOUNTER — Other Ambulatory Visit (HOSPITAL_COMMUNITY): Payer: Self-pay

## 2023-06-22 ENCOUNTER — Other Ambulatory Visit (HOSPITAL_COMMUNITY): Payer: Self-pay

## 2023-07-05 ENCOUNTER — Other Ambulatory Visit (HOSPITAL_COMMUNITY): Payer: Self-pay

## 2023-07-05 DIAGNOSIS — K219 Gastro-esophageal reflux disease without esophagitis: Secondary | ICD-10-CM | POA: Diagnosis not present

## 2023-07-05 DIAGNOSIS — I10 Essential (primary) hypertension: Secondary | ICD-10-CM | POA: Diagnosis not present

## 2023-07-05 DIAGNOSIS — E1165 Type 2 diabetes mellitus with hyperglycemia: Secondary | ICD-10-CM | POA: Diagnosis not present

## 2023-07-05 MED ORDER — VALSARTAN 80 MG PO TABS
80.0000 mg | ORAL_TABLET | Freq: Every day | ORAL | 0 refills | Status: DC
  Filled 2023-07-05: qty 90, 90d supply, fill #0

## 2023-07-05 MED ORDER — OZEMPIC (0.25 OR 0.5 MG/DOSE) 2 MG/3ML ~~LOC~~ SOPN
0.2500 mg | PEN_INJECTOR | SUBCUTANEOUS | 2 refills | Status: AC
Start: 2023-07-05 — End: ?
  Filled 2023-07-05: qty 3, 56d supply, fill #0

## 2023-07-07 ENCOUNTER — Ambulatory Visit
Admission: RE | Admit: 2023-07-07 | Discharge: 2023-07-07 | Disposition: A | Discharge status: Home / Self Care | Source: Ambulatory Visit | Attending: Acute Care | Admitting: Acute Care

## 2023-07-07 DIAGNOSIS — Z122 Encounter for screening for malignant neoplasm of respiratory organs: Secondary | ICD-10-CM | POA: Diagnosis not present

## 2023-07-07 DIAGNOSIS — Z87891 Personal history of nicotine dependence: Secondary | ICD-10-CM

## 2023-07-08 DIAGNOSIS — C678 Malignant neoplasm of overlapping sites of bladder: Secondary | ICD-10-CM | POA: Diagnosis not present

## 2023-07-27 ENCOUNTER — Telehealth: Payer: Self-pay

## 2023-07-27 NOTE — Telephone Encounter (Signed)
 Message sent to [facility]

## 2023-07-27 NOTE — Telephone Encounter (Signed)
 Copied from CRM [id number]. Topic: Clinical - Lab/Test Results >> Jul 23, 2023 12:21 PM Casey [name] wrote: Reason for CRM: please call patient with lung cancer screening results.  Please advise once results are available. Thank you.

## 2023-07-30 ENCOUNTER — Telehealth: Payer: Self-pay | Admitting: Acute Care

## 2023-07-30 DIAGNOSIS — Z87891 Personal history of nicotine dependence: Secondary | ICD-10-CM

## 2023-07-30 DIAGNOSIS — Z122 Encounter for screening for malignant neoplasm of respiratory organs: Secondary | ICD-10-CM

## 2023-07-30 DIAGNOSIS — I719 Aortic aneurysm of unspecified site, without rupture: Secondary | ICD-10-CM

## 2023-07-30 NOTE — Telephone Encounter (Signed)
 12 month follow up LDCT  Can we call his PCP and have her follow the Exophytic 1.2 cm left upper pole cystic focus which  demonstrates interval increased density since 03/25/2022. Looking at Dr. Luciana Ruth records, he has history of bladder and prostate cancer. Radiology are recommending a dedicated renal MRI or CT Abdomen.  Please ask her to order recommended imaging and follow-up for results.  I have called the patient with the results of his scan.  He understands that there is a left upper pole cyst that radiology recommend needs further evaluation, as it has become more solid.  Please see the above note and call his primary care office Monday if possible.  There was also notation of a 4.1 cm ascending thoracic aortic aneurysm.  I have referred him to thoracic surgery as he also has a history of hypertension.  This is the first year this was noted on his scan.  Patient also has coronary artery disease.  He is currently on statin therapy.  States his only family history is a grandmother who passed away of heart attack in her 72s.  But no other significant family history.  Thank you so much

## 2023-08-02 ENCOUNTER — Other Ambulatory Visit: Payer: Self-pay

## 2023-08-02 DIAGNOSIS — Z87891 Personal history of nicotine dependence: Secondary | ICD-10-CM

## 2023-08-02 DIAGNOSIS — Z122 Encounter for screening for malignant neoplasm of respiratory organs: Secondary | ICD-10-CM

## 2023-08-02 NOTE — Telephone Encounter (Signed)
 Called Dr. Luciana Ruth office to discuss results. Had to leave VM.

## 2023-08-02 NOTE — Telephone Encounter (Signed)
 Dr. Luciana Ruth office returned the call. Informed Casey [name] of the results and recs of the renal cyst. She states she will give this information to Dr. Avon Boers and get this ordered. I have faxed the CT results to Dr. Avon Boers (fax number verified). Annual scan ordered.

## 2023-08-05 ENCOUNTER — Other Ambulatory Visit (HOSPITAL_COMMUNITY): Payer: Self-pay

## 2023-08-05 ENCOUNTER — Other Ambulatory Visit: Payer: Self-pay

## 2023-08-09 DIAGNOSIS — I700 Atherosclerosis of aorta: Secondary | ICD-10-CM | POA: Diagnosis not present

## 2023-08-09 DIAGNOSIS — N281 Cyst of kidney, acquired: Secondary | ICD-10-CM | POA: Diagnosis not present

## 2023-08-09 DIAGNOSIS — I2510 Atherosclerotic heart disease of native coronary artery without angina pectoris: Secondary | ICD-10-CM | POA: Diagnosis not present

## 2023-08-09 DIAGNOSIS — R911 Solitary pulmonary nodule: Secondary | ICD-10-CM | POA: Diagnosis not present

## 2023-08-11 ENCOUNTER — Telehealth (HOSPITAL_BASED_OUTPATIENT_CLINIC_OR_DEPARTMENT_OTHER): Payer: Self-pay

## 2023-08-11 NOTE — Telephone Encounter (Signed)
 Copied from CRM [id number]. Topic: General - Billing Inquiry >> Aug 11, 2023  9:44 AM Chantha C wrote: Reason for CRM: Patient has questions with his Lung cancer screening, it was billed it as a diagnostic but it's a preventative as he done before. Patient states his insurance covers the preventation not diagnostic until he meets his deductible. Patient states the codes primary Z12.2 and secondary Z87.891. Patient states his insurance called the imaging department for clarification. Patient thinks this is a mistake either from NP, Groce or imaging and want to a call back [phone] or via MyChart.

## 2023-08-13 NOTE — Telephone Encounter (Signed)
 Spoke with patient and answered billing questions regarding recent lung screening CT. Patient has our direct call back number and will call if he has any further questions or concerns.

## 2023-08-20 DIAGNOSIS — Z125 Encounter for screening for malignant neoplasm of prostate: Secondary | ICD-10-CM | POA: Diagnosis not present

## 2023-08-20 DIAGNOSIS — Z0000 Encounter for general adult medical examination without abnormal findings: Secondary | ICD-10-CM | POA: Diagnosis not present

## 2023-08-20 DIAGNOSIS — Z13220 Encounter for screening for lipoid disorders: Secondary | ICD-10-CM | POA: Diagnosis not present

## 2023-08-21 LAB — LAB REPORT - SCANNED
EGFR: 103
PSA, Total: 1.34

## 2023-08-31 ENCOUNTER — Ambulatory Visit: Attending: Thoracic Surgery (Cardiothoracic Vascular Surgery) | Admitting: Surgical

## 2023-08-31 VITALS — BP 108/76 | HR 89 | Resp 20 | Ht 73.0 in | Wt 223.3 lb

## 2023-08-31 DIAGNOSIS — I7121 Aneurysm of the ascending aorta, without rupture: Secondary | ICD-10-CM | POA: Diagnosis not present

## 2023-08-31 NOTE — Patient Instructions (Signed)
 Lifestyle modifications and good control blood pressure and other clinical conditions as discussed

## 2023-08-31 NOTE — Progress Notes (Signed)
 [address] [place] [address]             [phone]            Casey [name] [id number] 1965-03-05      History of Present Illness: Patient is a 58 year old male seen in the office on today's date and consultation for findings on screening low-dose chest CT done for lung cancer showing a finding of a 4.1 cm ascending thoracic aortic aneurysm.  From a cardiac perspective he denies chest pain or tightness, shortness of breath at rest, palpitations, atrial fibrillation history, leg swelling, dizzy spells, or syncope.  He does admit to some shortness of breath with exertion.  He does have a history of hypertension which overall is well-controlled.  He keeps a log of blood pressures and his phone and knows to discuss abnormal findings with primary who manages his blood pressure medication.  He initially does have a history of diabetes which is also reportedly under good control with current regimen.  He is a former smoker with a history of 61 pack years quitting in 2017.  He does not have an echocardiogram in the [facility] epic system.   Past Medical History:  Diagnosis Date   Anxiety    Bladder tumor    Cancer (HCC)    Diabetes mellitus without complication (HCC)    does not check cbg regularly   Eczema    GERD (gastroesophageal reflux disease)    History of adenomatous polyp of colon    History of kidney stones    Hypertension    Nocturia    OSA (obstructive sleep apnea)    study in epic 08-02-2018;  followed by dr buck, does not use CPAP   RBBB (right bundle branch block)    Seasonal allergic rhinitis    Wears glasses      Past Surgical History:  Procedure Laterality Date   COLONOSCOPY WITH PROPOFOL   02/10/2017   EXTRACORPOREAL SHOCK WAVE LITHOTRIPSY Left 01/27/2018   Procedure: EXTRACORPOREAL SHOCK WAVE LITHOTRIPSY (ESWL);  Surgeon: Gaston Hamilton, MD;  Location: [facility];  Service: Urology;  Laterality: Left;   LAPAROSCOPIC APPENDECTOMY   08/16/2003   @[facility]    TRANSURETHRAL RESECTION OF BLADDER TUMOR  2020   TRANSURETHRAL RESECTION OF BLADDER TUMOR WITH MITOMYCIN -C Bilateral 09/27/2020   Procedure: TRANSURETHRAL RESECTION OF BLADDER TUMOR WITH GEMCITABINE  BILATERAL RETROGRADE PYELOGRAM;  Surgeon: Carolee Sherwood JONETTA DOUGLAS, MD;  Location: [facility];  Service: Urology;  Laterality: Bilateral;   TRANSURETHRAL RESECTION OF BLADDER TUMOR WITH MITOMYCIN -C Bilateral 12/15/2021   Procedure: TRANSURETHRAL RESECTION OF BLADDER TUMOR BILATERAL RETROGRADE PYELOGRAM WITH GEMCITABINE ;  Surgeon: Carolee Sherwood JONETTA DOUGLAS, MD;  Location: [facility] [place];  Service: Urology;  Laterality: Bilateral;  1 HR FOR CASE   URETEROSCOPY WITH HOLMIUM LASER LITHOTRIPSY  2020   WISDOM TOOTH EXTRACTION     age 1     Allergies  Allergen Reactions   Lisinopril  Cough    Social History   Occupational History   Not on file  Tobacco Use   Smoking status: Former    Current packs/day: 0.00    Average packs/day: 1.8 packs/day for 35.0 years (61.3 ttl pk-yrs)    Types: Cigarettes    Start date: 11/23/1980    Quit date: 11/24/2015    Years since quitting: 7.7   Smokeless tobacco: Never  Vaping Use   Vaping status: Never Used  Substance and Sexual Activity  Alcohol use: Yes    Comment: rare    Drug use: No   Sexual activity: Not on file    Current Outpatient Medications on File Prior to Visit  Medication Sig Dispense Refill   ALPRAZolam  (XANAX ) 0.5 MG tablet Take 1 tablet (0.5 mg total) by mouth 3 (three) times daily as needed for anxiety. 90 tablet 2   amoxicillin -clavulanate (AUGMENTIN ) 875-125 MG tablet Take 1 tablet by mouth 2 (two) times daily. 20 tablet 0   augmented betamethasone  dipropionate (DIPROLENE -AF) 0.05 % cream Apply to affected area twice daily as needed. 50 g 1   benzonatate  (TESSALON ) 100 MG capsule Take 1 capsule (100 mg total) by mouth 3 (three) times daily as needed for cough. 30 capsule 0   Blood Glucose Monitoring  Suppl (ONE TOUCH ULTRA 2) w/Device KIT Use as directed to check blood glucose 2 times a day and as needed 1 kit 0   fluocinonide  ointment (LIDEX ) 0.05 % Apply to the affected areas twice daily x1 week. Use 1 week then off for 1 week, then use as needed for itching. 60 g 2   glucose blood (ONETOUCH ULTRA) test strip Use as directed to check blood glucose 2 times a day and as needed 100 each 11   Lancets (ONETOUCH DELICA PLUS LANCET30G) MISC Use as directed to check blood glucose 2 times a day and as needed 100 each 11   metFORMIN  (GLUCOPHAGE ) 1000 MG tablet Take 1/2 tablet by mouth 1 - 2 times daily --take 10 minutes prior to meals 180 tablet 0   metFORMIN  (GLUCOPHAGE ) 500 MG tablet Take 1 tablet (500 mg total) by mouth daily with breakfast. 90 tablet 1   metFORMIN  (GLUCOPHAGE -XR) 750 MG 24 hr tablet Take 1 tablet (750 mg total) by mouth daily with breakfast. 90 tablet 0   olmesartan  (BENICAR ) 20 MG tablet Take 1 tablet (20 mg total) by mouth daily. 90 tablet 3   olmesartan  (BENICAR ) 40 MG tablet Take 1 tablet (40 mg total) by mouth daily. 90 tablet 3   ondansetron  (ZOFRAN -ODT) 4 MG disintegrating tablet Take 1 tablet (4 mg total) by mouth every 8 (eight) hours as needed for nausea or vomiting. 20 tablet 0   pantoprazole  (PROTONIX ) 40 MG tablet Take 1 tablet (40 mg total) by mouth 2 (two) times daily as needed. Take 30 minutes before meal. 145 tablet 1   pantoprazole  (PROTONIX ) 40 MG tablet Take 1 tablet (40 mg total) by mouth daily. 90 tablet 1   rosuvastatin  (CRESTOR ) 5 MG tablet Take 1 tablet (5 mg) by mouth daily. 90 tablet 0   rosuvastatin  (CRESTOR ) 5 MG tablet Take 1 tablet (5 mg total) by mouth daily. 90 tablet 1   Semaglutide ,0.25 or 0.5MG /DOS, (OZEMPIC , 0.25 OR 0.5 MG/DOSE,) 2 MG/3ML SOPN Inject 0.25 mg into the skin once a week. 3 mL 2   Semaglutide ,0.25 or 0.5MG /DOS, (OZEMPIC , 0.25 OR 0.5 MG/DOSE,) 2 MG/3ML SOPN Inject 0.5 mg into the skin once a week. 3 mL 2   Semaglutide ,0.25 or  0.5MG /DOS, (OZEMPIC , 0.25 OR 0.5 MG/DOSE,) 2 MG/3ML SOPN Inject 0.25 mg into the skin once a week as directed 3 mL 2   Semaglutide ,0.25 or 0.5MG /DOS, (OZEMPIC , 0.25 OR 0.5 MG/DOSE,) 2 MG/3ML SOPN Inject 0.25 mg into the skin once a week. 3 mL 2   thiamine  (VITAMIN B-1) 50 MG tablet Take 1 tablet (50 mg total) by mouth daily. 90 tablet 1   triamcinolone  cream (KENALOG ) 0.1 % Apply to the affected areas twice daily  for itching 454 g 1   valsartan  (DIOVAN ) 80 MG tablet Take 1 tablet (80 mg total) by mouth daily. 90 tablet 0   No current facility-administered medications on file prior to visit.     There were no vitals taken for this visit.  Physical Exam Vitals reviewed.  Constitutional:      Appearance: Normal appearance. He is normal weight. He is not ill-appearing.  HENT:     Head: Normocephalic and atraumatic.     Mouth/Throat:     Mouth: Mucous membranes are moist.     Pharynx: No oropharyngeal exudate or posterior oropharyngeal erythema.     Comments: Good dentition grossly Eyes:     General: No scleral icterus.    Extraocular Movements: Extraocular movements intact.     Conjunctiva/sclera: Conjunctivae normal.     Pupils: Pupils are equal, round, and reactive to light.  Cardiovascular:     Rate and Rhythm: Normal rate and regular rhythm.     Heart sounds: Normal heart sounds. No murmur heard. Pulmonary:     Effort: Pulmonary effort is normal.     Breath sounds: Normal breath sounds.  Abdominal:     Palpations: Abdomen is soft.  Musculoskeletal:        General: No swelling.     Right lower leg: No edema.     Left lower leg: No edema.  Skin:    Coloration: Skin is not jaundiced or pale.  Neurological:     Mental Status: He is alert.  Psychiatric:        Thought Content: Thought content normal.     CTA Results: Narrative & Impression  CLINICAL DATA:  58 year old male former smoker with 59 pack-year history. Lung cancer screening.   EXAM: CT CHEST WITHOUT  CONTRAST LOW-DOSE FOR LUNG CANCER SCREENING   TECHNIQUE: Multidetector CT imaging of the chest was performed following the standard protocol without IV contrast.   RADIATION DOSE REDUCTION: This exam was performed according to the departmental dose-optimization program which includes automated exposure control, adjustment of the mA and/or kV according to patient size and/or use of iterative reconstruction technique.   COMPARISON:  CT chest dated 07/06/2022, CT abdomen and pelvis dated 03/25/2022   FINDINGS: Cardiovascular: Normal heart size. No significant pericardial fluid/thickening. Ascending thoracic aorta measures 4.1 cm. Coronary artery calcifications and aortic atherosclerosis.   Mediastinum/Nodes: Imaged thyroid  gland without nodules meeting criteria for imaging follow-up by size. Normal esophagus. No pathologically enlarged axillary, supraclavicular, mediastinal, or hilar lymph nodes. Calcified left hilar lymph nodes, likely sequela of prior granulomatous infection.   Lungs/Pleura: The central airways are patent. Similar subpleural ground-glass opacities and reticulations in the left lower lobe. Scattered solid and ground-glass nodules are unchanged, for example measuring up to average 9.6 mm in diameter within medial left apex (3:59). No focal consolidation. No pneumothorax. No pleural effusion.   Upper abdomen: Cholelithiasis. Exophytic 1.2 cm left upper pole cystic focus (4:202) demonstrates interval increased density since 03/25/2022. Unchanged enlarged spleen measures 14.6 cm in AP dimension. Exophytic 1.7 cm posterior left upper pole simple cyst (2:65).   Musculoskeletal: No acute or abnormal lytic or blastic osseous lesions.   IMPRESSION: 1. Lung-RADS 2S, benign appearance or behavior. Continue annual screening with low-dose chest CT without contrast in 12 months. 2. The S modifier above refers to potentially clinically significant non lung cancer  related findings. Specifically, there is an exophytic left upper pole renal cyst demonstrating increased density since 03/25/2022, most likely hemorrhagic/proteinaceous cyst, however renal protocol MRI)  preferred) or CT abdomen is recommended for further evaluation. 3. Unchanged splenomegaly. 4. Ascending thoracic aorta measures 4.1 cm. Recommend annual imaging followup by CTA or MRA. This recommendation follows 2010 ACCF/AHA/AATS/ACR/ASA/SCA/SCAI/SIR/STS/SVM Guidelines for the Diagnosis and Management of Patients with Thoracic Aortic Disease. Circulation. 2010; 121: Z733-z630. Aortic aneurysm NOS (ICD10-I71.9) 5. Aortic Atherosclerosis (ICD10-I70.0). Coronary artery calcifications. Assessment for potential risk factor modification, dietary therapy or pharmacologic therapy may be warranted, if clinically indicated.     Electronically Signed   By: Limin  Xu M.D.   On: 07/30/2023 11:37       A/P: 4.1 cm ascending aortic aneurysm.  We will continue to follow this annual basis with CTA.     I did review the patient's notes and chart review and the finding of the kidney is being addressed.  This is being addressed by Dr. Waylan.   Risk Modification: Discussed usual risk modifications pertinent to aneurysm disease.  Statin: On low-dose Crestor   Smoking cessation instruction/counseling given: Former smoker  Patient was counseled on importance of Blood Pressure Control.  Despite Medical intervention if the patient notices persistently elevated blood pressure readings.  They are instructed to contact their Primary Care Physician  Please avoid use of Fluoroquinolones as this can potentially increase your risk of Aortic Rupture and/or Dissection  Patient educated on signs and symptoms of Aortic Dissection, handout also provided in AVS  Lemond FORBES Cera, PA-C 08/31/23

## 2023-09-02 ENCOUNTER — Other Ambulatory Visit (HOSPITAL_COMMUNITY): Payer: Self-pay

## 2023-09-02 DIAGNOSIS — E1121 Type 2 diabetes mellitus with diabetic nephropathy: Secondary | ICD-10-CM | POA: Diagnosis not present

## 2023-09-02 DIAGNOSIS — I10 Essential (primary) hypertension: Secondary | ICD-10-CM | POA: Diagnosis not present

## 2023-09-02 DIAGNOSIS — E1165 Type 2 diabetes mellitus with hyperglycemia: Secondary | ICD-10-CM | POA: Diagnosis not present

## 2023-09-02 MED ORDER — OZEMPIC (0.25 OR 0.5 MG/DOSE) 2 MG/3ML ~~LOC~~ SOPN
0.2500 mg | PEN_INJECTOR | SUBCUTANEOUS | 2 refills | Status: AC
  Filled 2023-09-02 – 2023-11-01 (×3): qty 3, 56d supply, fill #0

## 2023-09-02 MED ORDER — VALSARTAN 80 MG PO TABS
80.0000 mg | ORAL_TABLET | Freq: Every day | ORAL | 0 refills | Status: DC
  Filled 2023-09-02 – 2023-09-22 (×2): qty 90, 90d supply, fill #0

## 2023-09-02 MED ORDER — ROSUVASTATIN CALCIUM 5 MG PO TABS
5.0000 mg | ORAL_TABLET | Freq: Every day | ORAL | 1 refills | Status: AC
  Filled 2023-09-02 – 2023-09-22 (×2): qty 90, 90d supply, fill #0
  Filled 2024-01-02: qty 90, 90d supply, fill #1

## 2023-09-06 DIAGNOSIS — Z23 Encounter for immunization: Secondary | ICD-10-CM | POA: Diagnosis not present

## 2023-09-06 DIAGNOSIS — Z0000 Encounter for general adult medical examination without abnormal findings: Secondary | ICD-10-CM | POA: Diagnosis not present

## 2023-09-07 ENCOUNTER — Other Ambulatory Visit (HOSPITAL_COMMUNITY): Payer: Self-pay

## 2023-09-08 ENCOUNTER — Other Ambulatory Visit (HOSPITAL_COMMUNITY): Payer: Self-pay

## 2023-09-13 ENCOUNTER — Other Ambulatory Visit (HOSPITAL_COMMUNITY): Payer: Self-pay

## 2023-09-22 ENCOUNTER — Other Ambulatory Visit (HOSPITAL_COMMUNITY): Payer: Self-pay

## 2023-09-22 ENCOUNTER — Other Ambulatory Visit: Payer: Self-pay

## 2023-10-08 ENCOUNTER — Other Ambulatory Visit (HOSPITAL_COMMUNITY): Payer: Self-pay

## 2023-10-08 DIAGNOSIS — D4102 Neoplasm of uncertain behavior of left kidney: Secondary | ICD-10-CM | POA: Diagnosis not present

## 2023-10-08 DIAGNOSIS — C678 Malignant neoplasm of overlapping sites of bladder: Secondary | ICD-10-CM | POA: Diagnosis not present

## 2023-10-08 MED ORDER — VALSARTAN 80 MG PO TABS
80.0000 mg | ORAL_TABLET | Freq: Every day | ORAL | 0 refills | Status: AC
  Filled 2023-10-08 – 2024-01-02 (×4): qty 90, 90d supply, fill #0

## 2023-10-09 ENCOUNTER — Other Ambulatory Visit (HOSPITAL_COMMUNITY): Payer: Self-pay

## 2023-10-11 ENCOUNTER — Other Ambulatory Visit (HOSPITAL_COMMUNITY): Payer: Self-pay

## 2023-10-12 ENCOUNTER — Other Ambulatory Visit: Payer: Self-pay | Admitting: Urology

## 2023-10-12 ENCOUNTER — Other Ambulatory Visit (HOSPITAL_COMMUNITY): Payer: Self-pay

## 2023-10-12 DIAGNOSIS — D4102 Neoplasm of uncertain behavior of left kidney: Secondary | ICD-10-CM

## 2023-10-18 ENCOUNTER — Other Ambulatory Visit (HOSPITAL_COMMUNITY): Payer: Self-pay

## 2023-10-18 DIAGNOSIS — E119 Type 2 diabetes mellitus without complications: Secondary | ICD-10-CM | POA: Diagnosis not present

## 2023-10-18 MED ORDER — OZEMPIC (0.25 OR 0.5 MG/DOSE) 2 MG/3ML ~~LOC~~ SOPN
0.5000 mg | PEN_INJECTOR | SUBCUTANEOUS | 6 refills | Status: AC
  Filled 2023-10-18 – 2023-11-01 (×2): qty 9, 84d supply, fill #0
  Filled 2023-11-01: qty 3, 28d supply, fill #0
  Filled 2023-11-01 – 2023-11-17 (×2): qty 9, 84d supply, fill #0
  Filled 2024-02-05: qty 9, 84d supply, fill #1

## 2023-11-01 ENCOUNTER — Other Ambulatory Visit (HOSPITAL_COMMUNITY): Payer: Self-pay

## 2023-11-17 ENCOUNTER — Other Ambulatory Visit: Payer: Self-pay

## 2023-11-17 ENCOUNTER — Other Ambulatory Visit (HOSPITAL_COMMUNITY): Payer: Self-pay

## 2023-11-18 ENCOUNTER — Ambulatory Visit
Admission: RE | Admit: 2023-11-18 | Discharge: 2023-11-18 | Disposition: A | Discharge status: Home / Self Care | Source: Ambulatory Visit | Attending: Urology | Admitting: Urology

## 2023-11-18 DIAGNOSIS — D4102 Neoplasm of uncertain behavior of left kidney: Secondary | ICD-10-CM

## 2023-11-18 DIAGNOSIS — K8020 Calculus of gallbladder without cholecystitis without obstruction: Secondary | ICD-10-CM | POA: Diagnosis not present

## 2023-11-18 MED ORDER — GADOPICLENOL 0.5 MMOL/ML IV SOLN
10.0000 mL | Freq: Once | INTRAVENOUS | Status: AC | PRN
Start: 2023-11-18 — End: 2023-11-18
  Administered 2023-11-18: 10 mL via INTRAVENOUS

## 2023-11-20 ENCOUNTER — Other Ambulatory Visit: Payer: Self-pay

## 2023-11-20 ENCOUNTER — Ambulatory Visit (HOSPITAL_COMMUNITY)
Admission: EM | Admit: 2023-11-20 | Discharge: 2023-11-20 | Disposition: A | Discharge status: Home / Self Care | Attending: Emergency Medicine | Admitting: Emergency Medicine

## 2023-11-20 ENCOUNTER — Ambulatory Visit (INDEPENDENT_AMBULATORY_CARE_PROVIDER_SITE_OTHER)

## 2023-11-20 DIAGNOSIS — M79644 Pain in right finger(s): Secondary | ICD-10-CM | POA: Diagnosis not present

## 2023-11-20 DIAGNOSIS — M7989 Other specified soft tissue disorders: Secondary | ICD-10-CM | POA: Diagnosis not present

## 2023-11-20 DIAGNOSIS — M20011 Mallet finger of right finger(s): Secondary | ICD-10-CM | POA: Diagnosis not present

## 2023-11-20 NOTE — ED Provider Notes (Signed)
 [facility]    CSN: [id number] Arrival date & time: 11/20/23  1226      History   Chief Complaint Chief Complaint  Patient presents with   Finger Injury    HPI Casey [name] is a 58 y.o. male.  Patient with past history significant for type 2 diabetes, hypertension, and eczema who presents to urgent care today with concerns of a finger injury.  He reports that he initially injured his right middle finger several weeks ago while he was helping a friend move furniture.  He denies any pain in this finger but does report that he has the right middle finger permanently flexed at the DIP with no pain with manual extension of the finger.  Denies any tingling, numbness, and states that he can still use the finger at baseline level but unsure as to why the finger states flex and is slightly erythematous.  HPI  Past Medical History:  Diagnosis Date   Anxiety    Bladder tumor    Cancer (HCC)    Diabetes mellitus without complication (HCC)    does not check cbg regularly   Eczema    GERD (gastroesophageal reflux disease)    History of adenomatous polyp of colon    History of kidney stones    Hypertension    Nocturia    OSA (obstructive sleep apnea)    study in epic 08-02-2018;  followed by dr buck, does not use CPAP   RBBB (right bundle branch block)    Seasonal allergic rhinitis    Wears glasses     Patient Active Problem List   Diagnosis Date Noted   Anemia due to acquired thiamine  deficiency 08/25/2022   Macrocytic anemia 08/21/2022   Idiopathic hypotension 08/20/2022   NASH (nonalcoholic steatohepatitis) 02/18/2022   Need for pneumococcal 20-valent conjugate vaccination 02/18/2022   GAD (generalized anxiety disorder) 11/19/2021   Abnormal EKG 11/19/2021   Type II diabetes mellitus with manifestations (HCC) 11/19/2021   Hyperlipidemia LDL goal <70 11/19/2021   OSA (obstructive sleep apnea)    Encounter for general adult medical examination with abnormal findings  07/18/2020   Primary hypertension 07/18/2020   Patellofemoral pain syndrome of both knees 09/19/2018   Eczema 04/06/2015    Past Surgical History:  Procedure Laterality Date   COLONOSCOPY WITH PROPOFOL   02/10/2017   EXTRACORPOREAL SHOCK WAVE LITHOTRIPSY Left 01/27/2018   Procedure: EXTRACORPOREAL SHOCK WAVE LITHOTRIPSY (ESWL);  Surgeon: Gaston Hamilton, MD;  Location: [facility];  Service: Urology;  Laterality: Left;   LAPAROSCOPIC APPENDECTOMY  08/16/2003   @[facility]    TRANSURETHRAL RESECTION OF BLADDER TUMOR  2020   TRANSURETHRAL RESECTION OF BLADDER TUMOR WITH MITOMYCIN -C Bilateral 09/27/2020   Procedure: TRANSURETHRAL RESECTION OF BLADDER TUMOR WITH GEMCITABINE  BILATERAL RETROGRADE PYELOGRAM;  Surgeon: Carolee Sherwood JONETTA DOUGLAS, MD;  Location: [facility] [place];  Service: Urology;  Laterality: Bilateral;   TRANSURETHRAL RESECTION OF BLADDER TUMOR WITH MITOMYCIN -C Bilateral 12/15/2021   Procedure: TRANSURETHRAL RESECTION OF BLADDER TUMOR BILATERAL RETROGRADE PYELOGRAM WITH GEMCITABINE ;  Surgeon: Carolee Sherwood JONETTA DOUGLAS, MD;  Location: [facility] [place];  Service: Urology;  Laterality: Bilateral;  1 HR FOR CASE   URETEROSCOPY WITH HOLMIUM LASER LITHOTRIPSY  2020   WISDOM TOOTH EXTRACTION     age 34       Home Medications    Prior to Admission medications   Medication Sig Start Date End Date Taking? Authorizing Provider  augmented betamethasone  dipropionate (DIPROLENE -AF) 0.05 % cream Apply to affected area twice daily as  needed. 01/01/22  Yes   glucose blood (ONETOUCH ULTRA) test strip Use as directed to check blood glucose 2 times a day and as needed 01/11/23  Yes   Lancets (ONETOUCH DELICA PLUS LANCET30G) MISC Use as directed to check blood glucose 1-2 times a day and as needed 01/11/23  Yes   pantoprazole  (PROTONIX ) 40 MG tablet Take 1 tablet (40 mg total) by mouth 2 (two) times daily as needed. Take 30 minutes before meal. 11/10/22  Yes   rosuvastatin  (CRESTOR ) 5 MG tablet Take 1  tablet (5 mg total) by mouth at bedtime. 09/02/23  Yes   Semaglutide ,0.25 or 0.5MG /DOS, (OZEMPIC , 0.25 OR 0.5 MG/DOSE,) 2 MG/3ML SOPN Inject 0.5 mg into the skin once a week. 10/18/23  Yes   triamcinolone  cream (KENALOG ) 0.1 % Apply to the affected areas twice daily for itching 04/08/23  Yes   valsartan  (DIOVAN ) 80 MG tablet Take 1 tablet (80 mg total) by mouth at bedtime. 10/08/23  Yes   ALPRAZolam  (XANAX ) 0.5 MG tablet Take 1 tablet (0.5 mg total) by mouth 3 (three) times daily as needed for anxiety. 01/09/22   Joshua Debby CROME, MD  Blood Glucose Monitoring Suppl (ONE TOUCH ULTRA 2) w/Device KIT Use as directed to check blood glucose 2 times a day and as needed 01/11/23     fluocinonide  ointment (LIDEX ) 0.05 % Apply to the affected areas twice daily x1 week. Use 1 week then off for 1 week, then use as needed for itching. 01/06/23     pantoprazole  (PROTONIX ) 40 MG tablet Take 1 tablet (40 mg total) by mouth daily. 05/25/23     Semaglutide ,0.25 or 0.5MG /DOS, (OZEMPIC , 0.25 OR 0.5 MG/DOSE,) 2 MG/3ML SOPN Inject 0.25 mg into the skin once a week. 02/10/23     Semaglutide ,0.25 or 0.5MG /DOS, (OZEMPIC , 0.25 OR 0.5 MG/DOSE,) 2 MG/3ML SOPN Inject 0.5 mg into the skin once a week. 04/13/23     Semaglutide ,0.25 or 0.5MG /DOS, (OZEMPIC , 0.25 OR 0.5 MG/DOSE,) 2 MG/3ML SOPN Inject 0.25 mg into the skin once a week as directed 05/25/23     Semaglutide ,0.25 or 0.5MG /DOS, (OZEMPIC , 0.25 OR 0.5 MG/DOSE,) 2 MG/3ML SOPN Inject 0.25 mg into the skin once a week. 07/05/23     Semaglutide ,0.25 or 0.5MG /DOS, (OZEMPIC , 0.25 OR 0.5 MG/DOSE,) 2 MG/3ML SOPN Inject 0.25 mg into the skin once a week. 09/02/23     thiamine  (VITAMIN B-1) 50 MG tablet Take 1 tablet (50 mg total) by mouth daily. 08/25/22   Joshua Debby CROME, MD    Family History Family History  Problem Relation Age of Onset   CVA Mother    Allergic rhinitis Mother    Stroke Mother    Alcohol abuse Mother    Diabetes Mother    COPD Mother    Asthma Father        childhood    Hypertension Father    Bladder Cancer Father    Alcohol abuse Father    CVA Father    Lupus Maternal Aunt    COPD Maternal Grandmother    Colon cancer Neg Hx    Eczema Neg Hx    Urticaria Neg Hx    Angioedema Neg Hx    Colon polyps Neg Hx    Esophageal cancer Neg Hx    Prostate cancer Neg Hx    Rectal cancer Neg Hx    Stomach cancer Neg Hx     Social History Social History   Tobacco Use   Smoking status: Former  Current packs/day: 0.00    Average packs/day: 1.8 packs/day for 35.0 years (61.3 ttl pk-yrs)    Types: Cigarettes    Start date: 11/23/1980    Quit date: 11/24/2015    Years since quitting: 7.9   Smokeless tobacco: Never  Vaping Use   Vaping status: Never Used  Substance Use Topics   Alcohol use: Yes    Comment: rare    Drug use: No     Allergies   Lisinopril    Review of Systems Review of Systems  Musculoskeletal:        Finger injury  All other systems reviewed and are negative.    Physical Exam Triage Vital Signs ED Triage Vitals [11/20/23 1238]  Encounter Vitals Group     BP      Girls Systolic BP Percentile      Girls Diastolic BP Percentile      Boys Systolic BP Percentile      Boys Diastolic BP Percentile      Pulse      Resp      Temp      Temp src      SpO2      Weight      Height      Head Circumference      Peak Flow      Pain Score 0     Pain Loc      Pain Education      Exclude from Growth Chart    No data found.  Updated Vital Signs There were no vitals taken for this visit.  Visual Acuity Right Eye Distance:   Left Eye Distance:   Bilateral Distance:    Right Eye Near:   Left Eye Near:    Bilateral Near:     Physical Exam Vitals and nursing note reviewed.  Constitutional:      General: He is not in acute distress.    Appearance: He is well-developed.  HENT:     Head: Normocephalic and atraumatic.  Eyes:     Conjunctiva/sclera: Conjunctivae normal.  Cardiovascular:     Rate and Rhythm: Normal rate  and regular rhythm.     Heart sounds: No murmur heard. Pulmonary:     Effort: Pulmonary effort is normal. No respiratory distress.     Breath sounds: Normal breath sounds.  Abdominal:     Palpations: Abdomen is soft.     Tenderness: There is no abdominal tenderness.  Musculoskeletal:        General: Deformity present. No swelling, tenderness or signs of injury. Normal range of motion.     Right hand: Deformity present. No tenderness.     Cervical back: Neck supple.     Comments: Right middle finger with DIP held in flexion. There is no surrounding swelling or erythema. There is no pain with active or passive movement of the finger. No tenderness to palpation along the length flexor tendon sheath of the right middle finger into the palm.  Skin:    General: Skin is warm and dry.     Capillary Refill: Capillary refill takes less than 2 seconds.  Neurological:     Mental Status: He is alert.  Psychiatric:        Mood and Affect: Mood normal.      UC Treatments / Results  Labs (all labs ordered are listed, but only abnormal results are displayed) Labs Reviewed - No data to display  EKG   Radiology DG Finger Middle Right Result Date:  11/20/2023 CLINICAL DATA:  Right middle finger injury moving furniture. EXAM: RIGHT MIDDLE FINGER 2+V COMPARISON:  None Available. FINDINGS: The finger is flexed at the distal interphalangeal joint. On the lateral view, there is a 2 mm ossific fragment posterior to the middle phalangeal head, suspicious for an acute avulsion fracture from the dorsal base of the distal phalanx, mediated by the extensor tendon. No other evidence of acute fracture or dislocation. There is mild dorsal soft tissue swelling. No evidence of foreign body or soft tissue emphysema. IMPRESSION: Suspected acute avulsion fracture from the dorsal base of the distal phalanx of the right middle finger with associated flexion deformity at the DIP joint (mallet finger). Correlate clinically.  Electronically Signed   By: Elsie Perone M.D.   On: 11/20/2023 13:28   MR ABDOMEN WWO CONTRAST Result Date: 11/18/2023 CLINICAL DATA:  Neoplasm of kidney, history of bladder tumors EXAM: MRI ABDOMEN WITHOUT AND WITH CONTRAST TECHNIQUE: Multiplanar multisequence MR imaging of the abdomen was performed both before and after the administration of intravenous contrast. CONTRAST:  10 mL Vueway  gadolinium contrast IV COMPARISON:  CT abdomen pelvis, 03/25/2022 FINDINGS: Lower chest: No acute abnormality. Hepatobiliary: No solid liver abnormality is seen. Gallstone. No gallbladder wall thickening, or biliary dilatation. Pancreas: Unremarkable. No pancreatic ductal dilatation or surrounding inflammatory changes. Spleen: Normal in size without significant abnormality. Adrenals/Urinary Tract: Adrenal glands are unremarkable. Multiple small simple fluid signal and hemorrhagic or proteinaceous renal cortical cysts bilaterally, without solid lesion or suspicious contrast enhancement. Kidneys are otherwise normal, without obvious renal calculi, solid lesion, or hydronephrosis. Stomach/Bowel: Stomach is within normal limits. No evidence of bowel wall thickening, distention, or inflammatory changes. Vascular/Lymphatic: No significant vascular findings are present. No enlarged abdominal lymph nodes. Other: No abdominal wall hernia or abnormality. No ascites. Musculoskeletal: No acute or significant osseous findings. IMPRESSION: 1. Multiple small simple fluid signal and hemorrhagic or proteinaceous renal cortical cysts bilaterally, without solid lesion or suspicious contrast enhancement. No further follow-up or characterization is required for these benign Bosniak category I and II cysts. 2. Cholelithiasis. Electronically Signed   By: Marolyn JONETTA Jaksch M.D.   On: 11/18/2023 13:59    Procedures Procedures (including critical care time)  Medications Ordered in UC Medications - No data to display  Initial Impression /  Assessment and Plan / UC Course  I have reviewed the triage vital signs and the nursing notes.  Pertinent labs & imaging results that were available during my care of the patient were reviewed by me and considered in my medical decision making (see chart for details).     This patient presents to the UC for concern of finger injury. Differential diagnosis includes flexor tendon injury, finger dislocation, flexor tenosynovitis   Imaging Studies ordered:  I ordered imaging studies including xray of the right middle finger  I independently visualized and interpreted imaging which showed suspected acute avulsion fracture from the dorsal base of the distal phalanx of the right middle finger with associated flexion deformity at the DIP joint (mallet finger). Correlate clinically. I agree with the radiologist interpretation   Problem List / UC Course:  Patient with past history significant for type 2 diabetes, hypertension, eczema presents urgent care with concerns of a finger injury.  Reports he initially injured the finger some around 2 or 3 weeks ago while he was helping a friend move.  He is unsure exactly how he injured the finger but states that there is minimal pain at this time.  He does have concerns  of the finger is staying somewhat flexed but denies any significant pain with movement. Exam reveals a right middle finger held in flexion at the DIP.  No significant tenderness over this area.  No erythema, swelling, or induration to suggest infectious process. X-ray obtained shows concerns for suspected acute avulsion fracture of the dorsal base of the distal phalanx of the right middle finger with a flexion deformity.  Consistent with mallet deformity.  Will splint finger in slight hyperextension at the DIP and advise close follow-up with hand surgery for further evaluation.  Provided patient with contact permission for Dr. Delene, hand surgeon, for further assessment.  Return precautions  advised.  Patient discharged home in stable condition.   Social Determinants of Health:  None  Final Clinical Impressions(s) / UC Diagnoses   Final diagnoses:  Pain of right middle finger  Mallet deformity of right middle finger     Discharge Instructions      You were seen at urgent care today for concerns of a finger injury. You appear to have sustained a light avulsion fracture of the right middle finger now causing a mallet deformity. You were placed into a finger splint to help keep the finger extended. Follow up with a hand surgeon for further evaluation of this injury. Return to urgent care for any concerns of new or worsening symptoms.     ED Prescriptions   None    PDMP not reviewed this encounter.   Essa Wenk A, PA-C 11/20/23 1341

## 2023-11-20 NOTE — Discharge Instructions (Signed)
 You were seen at urgent care today for concerns of a finger injury. You appear to have sustained a light avulsion fracture of the right middle finger now causing a mallet deformity. You were placed into a finger splint to help keep the finger extended. Follow up with a hand surgeon for further evaluation of this injury. Return to urgent care for any concerns of new or worsening symptoms.

## 2023-11-20 NOTE — ED Triage Notes (Signed)
 PT reports RT middle finger injury while helping a friend  move furniture. Pt denies pain to finger.

## 2023-11-22 DIAGNOSIS — M20011 Mallet finger of right finger(s): Secondary | ICD-10-CM | POA: Diagnosis not present

## 2023-11-23 ENCOUNTER — Encounter: Payer: Self-pay | Admitting: *Deleted

## 2023-11-23 DIAGNOSIS — M25641 Stiffness of right hand, not elsewhere classified: Secondary | ICD-10-CM | POA: Diagnosis not present

## 2023-11-23 DIAGNOSIS — M79641 Pain in right hand: Secondary | ICD-10-CM | POA: Diagnosis not present

## 2023-11-23 NOTE — Progress Notes (Signed)
 Casey [name]                                          MRN: [id number]   11/23/2023   The [facility] Quality Team Specialist reviewed this patient medical record for the purposes of chart review for care gap closure. The following were reviewed: chart review for care gap closure-kidney health evaluation for diabetes:eGFR  and uACR.    [facility] Quality Team

## 2023-11-30 DIAGNOSIS — H2513 Age-related nuclear cataract, bilateral: Secondary | ICD-10-CM | POA: Diagnosis not present

## 2023-11-30 DIAGNOSIS — H43813 Vitreous degeneration, bilateral: Secondary | ICD-10-CM | POA: Diagnosis not present

## 2023-11-30 DIAGNOSIS — E119 Type 2 diabetes mellitus without complications: Secondary | ICD-10-CM | POA: Diagnosis not present

## 2023-11-30 DIAGNOSIS — D3131 Benign neoplasm of right choroid: Secondary | ICD-10-CM | POA: Diagnosis not present

## 2023-12-06 ENCOUNTER — Other Ambulatory Visit (HOSPITAL_COMMUNITY): Payer: Self-pay

## 2023-12-06 ENCOUNTER — Other Ambulatory Visit: Payer: Self-pay

## 2023-12-06 DIAGNOSIS — I10 Essential (primary) hypertension: Secondary | ICD-10-CM | POA: Diagnosis not present

## 2023-12-06 DIAGNOSIS — M79641 Pain in right hand: Secondary | ICD-10-CM | POA: Diagnosis not present

## 2023-12-06 DIAGNOSIS — Z23 Encounter for immunization: Secondary | ICD-10-CM | POA: Diagnosis not present

## 2023-12-06 DIAGNOSIS — E782 Mixed hyperlipidemia: Secondary | ICD-10-CM | POA: Diagnosis not present

## 2023-12-06 DIAGNOSIS — E1165 Type 2 diabetes mellitus with hyperglycemia: Secondary | ICD-10-CM | POA: Diagnosis not present

## 2023-12-06 DIAGNOSIS — Z789 Other specified health status: Secondary | ICD-10-CM | POA: Diagnosis not present

## 2023-12-06 DIAGNOSIS — M25641 Stiffness of right hand, not elsewhere classified: Secondary | ICD-10-CM | POA: Diagnosis not present

## 2023-12-06 DIAGNOSIS — Z7185 Encounter for immunization safety counseling: Secondary | ICD-10-CM | POA: Diagnosis not present

## 2023-12-06 MED ORDER — ROSUVASTATIN CALCIUM 5 MG PO TABS
5.0000 mg | ORAL_TABLET | Freq: Every day | ORAL | 1 refills | Status: AC
  Filled 2023-12-06: qty 90, 90d supply, fill #0

## 2023-12-06 MED ORDER — VALSARTAN 80 MG PO TABS
120.0000 mg | ORAL_TABLET | Freq: Every day | ORAL | 0 refills | Status: DC
  Filled 2023-12-06: qty 135, 90d supply, fill #0

## 2023-12-16 ENCOUNTER — Other Ambulatory Visit (HOSPITAL_COMMUNITY): Payer: Self-pay

## 2023-12-20 DIAGNOSIS — M25641 Stiffness of right hand, not elsewhere classified: Secondary | ICD-10-CM | POA: Diagnosis not present

## 2023-12-20 DIAGNOSIS — M79641 Pain in right hand: Secondary | ICD-10-CM | POA: Diagnosis not present

## 2023-12-30 ENCOUNTER — Ambulatory Visit: Payer: BC Managed Care – PPO | Admitting: Podiatry

## 2023-12-30 VITALS — Ht 73.0 in | Wt 230.0 lb

## 2023-12-30 DIAGNOSIS — E119 Type 2 diabetes mellitus without complications: Secondary | ICD-10-CM

## 2023-12-30 DIAGNOSIS — Z0189 Encounter for other specified special examinations: Secondary | ICD-10-CM

## 2024-01-02 NOTE — Progress Notes (Signed)
  Subjective:  Patient ID: Casey [name], male    DOB: [birth year],  MRN: [id number]  Chief Complaint  Patient presents with   Diabetes    Rm 8 DFC    58 y.o. male presents with the above complaint. History confirmed with patient.  No other new issues his A1c is well-controlled   Objective:  Physical Exam: warm, good capillary refill, no trophic changes or ulcerative lesions, normal DP and PT pulses, normal sensory exam, and normal monofilament exam Assessment:   1. Encounter for diabetic foot exam [facility])       Plan:  Patient was evaluated and treated and all questions answered.   Patient educated on diabetes. Discussed proper diabetic foot care and discussed risks and complications of disease. Educated patient in depth on reasons to return to the office immediately should he/she discover anything concerning or new on the feet. All questions answered. Discussed proper shoes as well.      No follow-ups on file.

## 2024-01-03 ENCOUNTER — Other Ambulatory Visit: Payer: Self-pay

## 2024-01-03 ENCOUNTER — Other Ambulatory Visit (HOSPITAL_COMMUNITY): Payer: Self-pay

## 2024-01-03 DIAGNOSIS — M25641 Stiffness of right hand, not elsewhere classified: Secondary | ICD-10-CM | POA: Diagnosis not present

## 2024-01-03 DIAGNOSIS — M79641 Pain in right hand: Secondary | ICD-10-CM | POA: Diagnosis not present

## 2024-01-17 DIAGNOSIS — M20011 Mallet finger of right finger(s): Secondary | ICD-10-CM | POA: Diagnosis not present

## 2024-01-17 DIAGNOSIS — M79641 Pain in right hand: Secondary | ICD-10-CM | POA: Diagnosis not present

## 2024-01-17 DIAGNOSIS — M25641 Stiffness of right hand, not elsewhere classified: Secondary | ICD-10-CM | POA: Diagnosis not present

## 2024-02-05 ENCOUNTER — Other Ambulatory Visit (HOSPITAL_COMMUNITY): Payer: Self-pay

## 2024-02-07 ENCOUNTER — Other Ambulatory Visit (HOSPITAL_COMMUNITY): Payer: Self-pay

## 2024-02-09 ENCOUNTER — Other Ambulatory Visit (HOSPITAL_COMMUNITY): Payer: Self-pay

## 2024-03-09 ENCOUNTER — Other Ambulatory Visit: Payer: Self-pay

## 2024-03-09 ENCOUNTER — Other Ambulatory Visit (HOSPITAL_COMMUNITY): Payer: Self-pay

## 2024-03-09 MED ORDER — PANTOPRAZOLE SODIUM 40 MG PO TBEC
40.0000 mg | DELAYED_RELEASE_TABLET | Freq: Every day | ORAL | 1 refills | Status: AC
  Filled 2024-03-09: qty 90, 90d supply, fill #0

## 2024-03-09 MED ORDER — VALSARTAN 160 MG PO TABS
160.0000 mg | ORAL_TABLET | Freq: Every day | ORAL | 0 refills | Status: AC
  Filled 2024-03-09: qty 90, 90d supply, fill #0

## 2024-03-09 MED ORDER — ROSUVASTATIN CALCIUM 5 MG PO TABS
5.0000 mg | ORAL_TABLET | Freq: Every evening | ORAL | 1 refills | Status: AC
  Filled 2024-03-09: qty 90, 90d supply, fill #0

## 2024-03-09 MED ORDER — OZEMPIC (0.25 OR 0.5 MG/DOSE) 2 MG/3ML ~~LOC~~ SOPN
0.5000 mg | PEN_INJECTOR | SUBCUTANEOUS | 6 refills | Status: AC
  Filled 2024-03-09: qty 3, 28d supply, fill #0

## 2024-03-10 ENCOUNTER — Other Ambulatory Visit (HOSPITAL_COMMUNITY): Payer: Self-pay

## 2024-12-28 ENCOUNTER — Ambulatory Visit: Admitting: Podiatry
